# Patient Record
Sex: Male | Born: 1940 | Race: White | Hispanic: No | Marital: Single | Smoking: Never smoker
Health system: Southern US, Community
[De-identification: ages and names within clinical notes are randomized; demographics above are authoritative.]

## PROBLEM LIST (undated history)

## (undated) DIAGNOSIS — G4730 Sleep apnea, unspecified: Secondary | ICD-10-CM

## (undated) DIAGNOSIS — H9190 Unspecified hearing loss, unspecified ear: Secondary | ICD-10-CM

## (undated) DIAGNOSIS — I10 Essential (primary) hypertension: Secondary | ICD-10-CM

## (undated) DIAGNOSIS — L0390 Cellulitis, unspecified: Secondary | ICD-10-CM

## (undated) DIAGNOSIS — M109 Gout, unspecified: Secondary | ICD-10-CM

## (undated) DIAGNOSIS — T7840XA Allergy, unspecified, initial encounter: Secondary | ICD-10-CM

## (undated) DIAGNOSIS — C20 Malignant neoplasm of rectum: Secondary | ICD-10-CM

## (undated) HISTORY — DX: Allergy, unspecified, initial encounter: T78.40XA

---

## 1999-10-20 ENCOUNTER — Encounter: Payer: Self-pay | Admitting: Emergency Medicine

## 1999-10-20 ENCOUNTER — Inpatient Hospital Stay (HOSPITAL_COMMUNITY): Admission: EM | Admit: 1999-10-20 | Discharge: 1999-10-22 | Payer: Self-pay | Admitting: Emergency Medicine

## 1999-10-29 ENCOUNTER — Encounter: Admission: RE | Admit: 1999-10-29 | Discharge: 1999-10-29 | Payer: Self-pay | Admitting: Hematology and Oncology

## 1999-11-18 ENCOUNTER — Encounter: Admission: RE | Admit: 1999-11-18 | Discharge: 1999-11-18 | Payer: Self-pay | Admitting: Internal Medicine

## 2013-09-08 ENCOUNTER — Emergency Department (HOSPITAL_COMMUNITY)
Admission: EM | Admit: 2013-09-08 | Discharge: 2013-09-08 | Disposition: A | Discharge status: Home / Self Care | Payer: Medicare Other | Attending: Emergency Medicine | Admitting: Emergency Medicine

## 2013-09-08 ENCOUNTER — Encounter (HOSPITAL_COMMUNITY): Payer: Self-pay | Admitting: Emergency Medicine

## 2013-09-08 DIAGNOSIS — R509 Fever, unspecified: Secondary | ICD-10-CM | POA: Insufficient documentation

## 2013-09-08 DIAGNOSIS — L03119 Cellulitis of unspecified part of limb: Principal | ICD-10-CM

## 2013-09-08 DIAGNOSIS — L0390 Cellulitis, unspecified: Secondary | ICD-10-CM

## 2013-09-08 DIAGNOSIS — L02419 Cutaneous abscess of limb, unspecified: Secondary | ICD-10-CM | POA: Insufficient documentation

## 2013-09-08 HISTORY — DX: Essential (primary) hypertension: I10

## 2013-09-08 HISTORY — DX: Cellulitis, unspecified: L03.90

## 2013-09-08 LAB — BASIC METABOLIC PANEL
BUN: 19 mg/dL (ref 6–23)
CO2: 26 mEq/L (ref 19–32)
Calcium: 9.1 mg/dL (ref 8.4–10.5)
Chloride: 100 mEq/L (ref 96–112)
Creatinine, Ser: 1.06 mg/dL (ref 0.50–1.35)
GFR calc Af Amer: 80 mL/min — ABNORMAL LOW (ref >90)
GFR calc non Af Amer: 69 mL/min — ABNORMAL LOW (ref >90)
Glucose, Bld: 91 mg/dL (ref 70–99)
Potassium: 3.2 mEq/L — ABNORMAL LOW (ref 3.7–5.3)
Sodium: 141 mEq/L (ref 137–147)

## 2013-09-08 LAB — CBC WITH DIFFERENTIAL/PLATELET
Basophils Absolute: 0 10*3/uL (ref 0.0–0.1)
Basophils Relative: 0 % (ref 0–1)
Eosinophils Absolute: 0.1 10*3/uL (ref 0.0–0.7)
Eosinophils Relative: 1 % (ref 0–5)
HCT: 41.9 % (ref 39.0–52.0)
Hemoglobin: 15.3 g/dL (ref 13.0–17.0)
Lymphocytes Relative: 10 % — ABNORMAL LOW (ref 12–46)
Lymphs Abs: 1.1 10*3/uL (ref 0.7–4.0)
MCH: 33.8 pg (ref 26.0–34.0)
MCHC: 36.5 g/dL — ABNORMAL HIGH (ref 30.0–36.0)
MCV: 92.7 fL (ref 78.0–100.0)
Monocytes Absolute: 1.1 10*3/uL — ABNORMAL HIGH (ref 0.1–1.0)
Monocytes Relative: 10 % (ref 3–12)
Neutro Abs: 8.8 10*3/uL — ABNORMAL HIGH (ref 1.7–7.7)
Neutrophils Relative %: 79 % — ABNORMAL HIGH (ref 43–77)
Platelets: 96 10*3/uL — ABNORMAL LOW (ref 150–400)
RBC: 4.52 MIL/uL (ref 4.22–5.81)
RDW: 13.4 % (ref 11.5–15.5)
WBC: 11.2 10*3/uL — ABNORMAL HIGH (ref 4.0–10.5)

## 2013-09-08 MED ORDER — SODIUM CHLORIDE 0.9 % IV BOLUS (SEPSIS)
1000.0000 mL | Freq: Once | INTRAVENOUS | Status: AC
  Administered 2013-09-08: 1000 mL via INTRAVENOUS

## 2013-09-08 MED ORDER — HYDROCODONE-ACETAMINOPHEN 7.5-325 MG/15ML PO SOLN: 15.0000 mL | Freq: Three times a day (TID) | ORAL | Status: DC | PRN

## 2013-09-08 MED ORDER — CLINDAMYCIN HCL 150 MG PO CAPS: 300.0000 mg | ORAL_CAPSULE | Freq: Three times a day (TID) | ORAL | Status: DC

## 2013-09-08 MED ORDER — CLINDAMYCIN HCL 300 MG PO CAPS
300.0000 mg | ORAL_CAPSULE | Freq: Once | ORAL | Status: AC
  Administered 2013-09-08: 300 mg via ORAL
  Filled 2013-09-08 (×3): qty 1

## 2013-09-08 NOTE — ED Notes (Signed)
Pt refusing to sign discharge.

## 2013-09-08 NOTE — ED Provider Notes (Signed)
Medical screening examination/treatment/procedure(s) were conducted as a shared visit with non-physician practitioner(s) and myself.  I personally evaluated the patient during the encounter.  EKG Interpretation   None        Patient here with cellulitis of his L leg. No systemic symptoms here. NVI distally. Mildly elevated white count, vitals stable. Patient wants to go home. Tolerating PO antibiotics, stable for discharge.  Osvaldo Shipper, MD 09/08/13 [phone]

## 2013-09-08 NOTE — ED Notes (Signed)
Pt presents with 3 day h/o L leg redness and swelling.  Pt reports h/o cellulitis; denies any long-distance travel or shortness of breath. Th.

## 2013-09-08 NOTE — ED Notes (Signed)
Pt reports he is here for recurrent cellulitis. C/o pain to left leg X 3 days. Reports chills at home, thinks he had a fever. sts last time he was admitted to the hospital. Pt has redness and swelling to left leg. Nad, skin warm and dry, resp e/u.

## 2013-09-08 NOTE — ED Provider Notes (Signed)
CSN: [id number]     Arrival date & time 09/08/13  1200 History   First MD Initiated Contact with Patient 09/08/13 1717     Chief Complaint  Patient presents with  . Cellulitis   (Consider location/radiation/quality/duration/timing/severity/associated sxs/prior Treatment) HPI Comments: Patient is a 73 year old male with a past medical history of hypertension and cellulitis who presents with a 3 day history of left leg pain. Symptoms started gradually and progressively worsened since the onset. The pain is aching and severe without radiation. 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 Patient denies any injury to the area. He denies history of DVT. Patient denies any chest pain, SOB, recent travel. Patient reports his wound has improved with antibiotics in the past.    Past Medical History  Diagnosis Date  . Cellulitis     left lower leg  . Hypertension     "borderline"   History reviewed. No pertinent past surgical history. No family history on file. History  Substance Use Topics  . Smoking status: Never Smoker   . Smokeless tobacco: Not on file  . Alcohol Use: No    Review of Systems  Constitutional: Positive for fever. Negative for chills and fatigue.  HENT: Negative for trouble swallowing.   Eyes: Negative for visual disturbance.  Respiratory: Negative for shortness of breath.   Cardiovascular: Negative for chest pain and palpitations.  Gastrointestinal: Negative for nausea, vomiting, abdominal pain and diarrhea.  Genitourinary: Negative for dysuria and difficulty urinating.  Musculoskeletal: Negative for arthralgias and neck pain.  Skin: Positive for color change and wound.  Neurological: Negative for dizziness and weakness.  Psychiatric/Behavioral: Negative for dysphoric mood.    Allergies  Sulfa antibiotics  Home Medications   Current Outpatient Rx  Name  Route  Sig  Dispense  Refill  . aspirin 325 MG  tablet   Oral   Take 650 mg by mouth every 4 (four) hours as needed for mild pain.          BP 128/74  Pulse 60  Temp(Src) 98.7 F (37.1 C) (Oral)  Resp 20  Ht 5\' 11"  (1.803 m)  Wt 275 lb (124.739 kg)  BMI 38.37 kg/m2  SpO2 95% Physical Exam  Nursing note and vitals reviewed. Constitutional: He is oriented to person, place, and time. He appears well-developed and well-nourished. No distress.  HENT:  Head: Normocephalic and atraumatic.  Eyes: Conjunctivae and EOM are normal.  Neck: Normal range of motion.  Cardiovascular: Normal rate and regular rhythm.  Exam reveals no gallop and no friction rub.   No murmur heard. Pulmonary/Chest: Effort normal and breath sounds normal. He has no wheezes. He has no rales. He exhibits no tenderness.  Abdominal: Soft. He exhibits no distension. There is no tenderness. There is no rebound.  Musculoskeletal: Normal range of motion.  No calf tenderness to palpation bilaterally.   Neurological: He is alert and oriented to person, place, and time. Coordination normal.  Speech is goal-oriented. Moves limbs without ataxia.   Skin: Skin is warm and dry.  Left lower leg erythematous, edematous and tender to palpation. No open wound.   Psychiatric: He has a normal mood and affect. His behavior is normal.    ED Course  Procedures (including critical care time) Labs Review Labs Reviewed  CBC WITH DIFFERENTIAL - Abnormal; Notable for the following:    WBC 11.2 (*)    MCHC 36.5 (*)  Neutrophils Relative % 79 (*)    Neutro Abs 8.8 (*)    Lymphocytes Relative 10 (*)    Monocytes Absolute 1.1 (*)    All other components within normal limits  BASIC METABOLIC PANEL   Imaging Review No results found.  EKG Interpretation   None       MDM   1. Cellulitis     6:17 PM Labs pending. Vitals stable and patient afebrile. No calf tenderness to palpation. Patient likely has cellulitis. Patient will be treated for cellulitis. I doubt DVT at this  time.   6:58 PM Patient has mildly elevated WBC. Patient received PO clindamycin and will be discharged with Clindamycin and vicodin for pain. Patient does not want to stay here for admission. Patient will be discharged with instructions to return with worsening or concerning symptoms. Patient will have resource guide with instructions to find a PCP. Vitals stable and patient afebrile.  Dr. Mingo Amber saw the patient and is agreeable to plan.   Alvina Chou, PA-C 09/08/13 1902

## 2013-09-08 NOTE — Discharge Instructions (Signed)
Take Clindamycin as directed until gone for your infection. Take Vicodin as needed for pain. Refer to attached documents for more information. Follow up with a primary care doctor from the resource guide. Return to the ED with worsening or concerning symptoms.    Emergency Department Resource Guide 1) Find a Doctor and Pay Out of Pocket Although you won't have to find out who is covered by your insurance plan, it is a good idea to ask around and get recommendations. You will then need to call the office and see if the doctor you have chosen will accept you as a new patient and what types of options they offer for patients who are self-pay. Some doctors offer discounts or will set up payment plans for their patients who do not have insurance, but you will need to ask so you aren't surprised when you get to your appointment.  2) Contact Your Local Health Department Not all health departments have doctors that can see patients for sick visits, but many do, so it is worth a call to see if yours does. If you don't know where your local health department is, you can check in your phone book. The CDC also has a tool to help you locate your state's health department, and many state websites also have listings of all of their local health departments.  3) Find a [facility] If your illness is not likely to be very severe or complicated, you may want to try a walk in clinic. These are popping up all over the country in pharmacies, drugstores, and shopping centers. They're usually staffed by nurse practitioners or physician assistants that have been trained to treat common illnesses and complaints. They're usually fairly quick and inexpensive. However, if you have serious medical issues or chronic medical problems, these are probably not your best option.  No Primary Care Doctor: - Call Health Connect at  [phone] - they can help you locate a primary care doctor that  accepts your insurance, provides certain  services, etc. - Physician Referral Service- [phone]  Chronic Pain Problems: Organization         Address  Phone   Notes  [facility]  [phone] Patients need to be referred by their primary care doctor.   Medication Assistance: Organization         Address  Phone   Notes  [facility] Medication [facility] [address] [place], [place] [address] [phone] --Must be a resident of [facility] -- Must have NO insurance coverage whatsoever (no Medicaid/ Medicare, etc.) -- The pt. MUST have a primary care doctor that directs their care regularly and follows them in the community   MedAssist  [phone]   [employer]  [phone]    Agencies that provide inexpensive medical care: Organization         Address  Phone   Notes  [place]  [phone]   [facility]    [phone]   [facility] [place], [place] [address] [phone]   [place] [address] [place] [phone]   Planned Parenthood    [phone]   [facility]    [phone]   [place] and [place] [address], [place] Phone:  [phone], Fax:  [phone] Hours of Operation:  9 am - 6 pm, M-F.  Also  accepts Medicaid/Medicare and self-pay.  [facility] for [place], [address], [place] Phone: [phone], Fax: [phone]. Hours of Operation:  8:30 am - 5:30 pm, M-F.  Also accepts Medicaid and self-pay.  [facility] [address], [place] Phone: [phone]   [place], [place], [place] [phone], Ext. [phone] Mondays & Thursdays: 7-9 AM.  First 15 patients are seen on a first come, first serve basis.    [place] Providers:  Organization         Address  Phone   Notes  [facility] [address], [place] [phone] Also accepts self-pay patients.  [facility] [id number] [place], [place]  [phone]   [place], [address], [place] [phone]   [facility] [address] [place] [phone]   Lucianne Lei [address], [place]   [phone] Only accepts [place] Access [place] patients after they have their name applied to their card.   Self-Pay (no insurance) in [facility]:  Organization         Address  Phone   Notes  Sickle Cell Patients, [facility] [place] [phone]   [facility] [place] [phone]   [facility] [place]  [place], [place], [place] [phone]   [facility]/Dr. Osei-Bonsu  [address], [place] or [address], [place] [phone] Phone number for both [place] and [place] locations is the same.  [facility] [address] [place] [phone]   [facility] [address], [place] or [address] [phone]   [facility] [address], [place] [phone], phone; [phone], fax Sees patients 1st and 3rd Saturday of every month.  Must not qualify for public or private insurance (i.e. Medicaid, Medicare, [place] Health Choice, Veterans' Benefits)  Household income should be no more than 200% of the poverty level The clinic cannot treat you if you are pregnant or think you are pregnant  Sexually transmitted diseases are not treated at the clinic.    Dental Care: Organization         Address  Phone  Notes  [facility] Department [facility] [place] [phone] Accepts  children up to age 58 who are enrolled in [place] or [place]; pregnant women with a Medicaid card; and children who have applied for Medicaid or [place] Health Choice, but were declined, whose parents can pay a reduced fee at time of service.  [facility] of [facility]  [address], [place] [phone] Accepts children up to age 73 who are enrolled in [place] or [place]; pregnant women with a Medicaid card; and children who have applied for Medicaid or [place] Health Choice, but were declined, whose parents can pay a reduced fee at time of service.  [place] Adult Dental Access PROGRAM  [place] [phone] Patients are seen by appointment only. Walk-ins are not accepted. [place] will see patients 13 years of age and older. Monday - Tuesday (8am-5pm)  Most Wednesdays (8:30-5pm) $30 per visit, cash only  [facility] Adult [employer] PROGRAM  [address], [facility] [phone] Patients are seen by appointment only. Walk-ins are not accepted. [place] will see patients 76 years of age and older. One Wednesday Evening (Monthly: Volunteer Based).  $30 per visit, cash only  [place]  [phone] for adults; Children under age 76, call Graduate Pediatric Dentistry at [phone]. Children aged 3-14, please call [phone] to request a pediatric application.  Dental services are provided in all areas of dental care including fillings, crowns and bridges, complete and partial dentures, implants, gum treatment, root canals, and extractions. Preventive care is also provided. Treatment is provided to both adults and children. Patients are selected via a lottery and there is often a waiting list.   [facility] [address] [place]  [phone] [email or web address]   [facility] [address] [place], [place] [phone], Ext. [phone] Second and  Fourth Thursday of each month, opens at 6:30 AM; Clinic ends at 9 AM.  Patients are seen on a first-come first-served basis, and a limited number are seen during each clinic.   [facility]  [address] Hillard Danker [place], [place] [phone]   Eligibility Requirements You must have lived in [place], [place], or [place] counties for at least the last three months.   You cannot be eligible for state or federal sponsored [employer], including [employer], [place], or [employer].   You generally cannot be eligible for healthcare insurance through your employer.    How to apply: Eligibility screenings are held every Tuesday and Wednesday afternoon from 1:00 pm until 4:00 pm. You do not need an appointment for the interview!  [facility] [address], [place], [place]   [place]  [place] Department  Industry  [phone]    Behavioral Health Resources in the Community: Intensive Outpatient Programs Organization         Address  Phone  Notes  [place] Ruth. [address], [place], [place] [phone]   [facility] Outpatient [address], [place], [place]   ADS: Alcohol & Drug Svcs [address], [place], [place]   [place] [address]  [place], [place] or [phone]   Substance Abuse Resources Organization         Address  Phone  Notes  Alcohol and Drug Services  [phone]   [place]  [phone]   [facility] [place]   Chinita Pester  [phone]   Residential & Outpatient Substance Abuse Program  [phone]   Psychological Services Organization         Address  Phone  Notes  [facility] [place]  [place]  [phone]   [place]  [address], [place] or [phone]    Mobile Crisis Teams Organization         Address  Phone  Notes  [facility], Mobile Crisis Care Unit  [phone]   [facility] Psychotherapeutic Services  [address] [place], [name]   [place] Levels [address] [place] [phone]    Self-Help/Support Groups Organization         Address  Phone  Notes  Mental Health Assoc. of [place] - variety of support groups  [place] Call for more information  Narcotics Anonymous (NA), [facility] [address], [employer] [place]  2 meetings at this location   Special educational needs teacher         Address  Phone  Notes  ASAP Residential Treatment [place],    [place]  [phone]   [facility]  [address] [place] 762831, [place], [place]   [place], [place] [phone] Admissions: 8am-3pm M-F  Incentives Substance [place] [address]    [place], [place] [phone]   [facility] [address] [place], [place], [place]   [facility] [address],  [place], [place]   [facility] Programs - Intensive Outpatient [address] Kristeen Mans [address], [place], [place]   [facility] [place].,  [place], [place] [phone] or [phone]   [facility] Services (RTS) [address] [place], [place] Accepts Medicaid  [facility] [employer] [address],  [place] [phone] Substance Abuse/Addiction Treatment   [facility] Organization         Address  Phone  Notes  [facility]  [phone]   Domenic Schwab, PhD [address] Arlis Porta [place], [place]   [phone] or [phone]   [place], [place] [phone]   [facility] [address] [place], [place] [phone] Insurance/Medicaid/sponsorship through [facility] and Families [address], [address] [place]                                    [place], [place] [phone] [place] [address], [place] [phone]    Dr. Adele Schilder  [phone]   [facility] Dept. 1) [address] [place] 2) [place] 3)  [place] 65, [place] [phone]  [phone]   [place] [phone] or [phone] (After Hours)

## 2014-02-09 ENCOUNTER — Inpatient Hospital Stay (HOSPITAL_COMMUNITY)
Admission: EM | Admit: 2014-02-09 | Discharge: 2014-02-12 | DRG: 871 | Disposition: A | Discharge status: Home / Self Care | Payer: Medicare Other | Attending: Internal Medicine | Admitting: Internal Medicine

## 2014-02-09 ENCOUNTER — Emergency Department (HOSPITAL_COMMUNITY): Payer: Medicare Other

## 2014-02-09 ENCOUNTER — Encounter (HOSPITAL_COMMUNITY): Payer: Self-pay | Admitting: Emergency Medicine

## 2014-02-09 DIAGNOSIS — L03116 Cellulitis of left lower limb: Secondary | ICD-10-CM

## 2014-02-09 DIAGNOSIS — L03119 Cellulitis of unspecified part of limb: Secondary | ICD-10-CM

## 2014-02-09 DIAGNOSIS — D696 Thrombocytopenia, unspecified: Secondary | ICD-10-CM | POA: Diagnosis present

## 2014-02-09 DIAGNOSIS — I872 Venous insufficiency (chronic) (peripheral): Secondary | ICD-10-CM | POA: Diagnosis present

## 2014-02-09 DIAGNOSIS — E876 Hypokalemia: Secondary | ICD-10-CM | POA: Diagnosis present

## 2014-02-09 DIAGNOSIS — L02419 Cutaneous abscess of limb, unspecified: Secondary | ICD-10-CM | POA: Diagnosis present

## 2014-02-09 DIAGNOSIS — I509 Heart failure, unspecified: Secondary | ICD-10-CM | POA: Diagnosis present

## 2014-02-09 DIAGNOSIS — G9341 Metabolic encephalopathy: Secondary | ICD-10-CM

## 2014-02-09 DIAGNOSIS — Z7982 Long term (current) use of aspirin: Secondary | ICD-10-CM

## 2014-02-09 DIAGNOSIS — I10 Essential (primary) hypertension: Secondary | ICD-10-CM | POA: Diagnosis present

## 2014-02-09 DIAGNOSIS — R509 Fever, unspecified: Secondary | ICD-10-CM | POA: Diagnosis present

## 2014-02-09 DIAGNOSIS — R5083 Postvaccination fever: Secondary | ICD-10-CM

## 2014-02-09 DIAGNOSIS — R410 Disorientation, unspecified: Secondary | ICD-10-CM

## 2014-02-09 DIAGNOSIS — A419 Sepsis, unspecified organism: Principal | ICD-10-CM | POA: Diagnosis present

## 2014-02-09 LAB — COMPREHENSIVE METABOLIC PANEL
ALK PHOS: 100 U/L (ref 39–117)
ALT: 25 U/L (ref 0–53)
AST: 32 U/L (ref 0–37)
Albumin: 4 g/dL (ref 3.5–5.2)
BUN: 13 mg/dL (ref 6–23)
CO2: 24 mEq/L (ref 19–32)
CREATININE: 1.13 mg/dL (ref 0.50–1.35)
Calcium: 9.2 mg/dL (ref 8.4–10.5)
Chloride: 102 mEq/L (ref 96–112)
GFR calc non Af Amer: 63 mL/min — ABNORMAL LOW (ref >90)
GFR, EST AFRICAN AMERICAN: 73 mL/min — AB (ref >90)
GLUCOSE: 114 mg/dL — AB (ref 70–99)
Potassium: 3.7 mEq/L (ref 3.7–5.3)
Sodium: 139 mEq/L (ref 137–147)
TOTAL PROTEIN: 8 g/dL (ref 6.0–8.3)
Total Bilirubin: 1 mg/dL (ref 0.3–1.2)

## 2014-02-09 LAB — URINALYSIS, ROUTINE W REFLEX MICROSCOPIC
Bilirubin Urine: NEGATIVE
Glucose, UA: NEGATIVE mg/dL
Ketones, ur: NEGATIVE mg/dL
Leukocytes, UA: NEGATIVE
Nitrite: NEGATIVE
PROTEIN: NEGATIVE mg/dL
Specific Gravity, Urine: 1.018 (ref 1.005–1.030)
UROBILINOGEN UA: 0.2 mg/dL (ref 0.0–1.0)
pH: 5 (ref 5.0–8.0)

## 2014-02-09 LAB — CK: Total CK: 103 U/L (ref 7–232)

## 2014-02-09 LAB — I-STAT CHEM 8, ED
BUN: 14 mg/dL (ref 6–23)
Calcium, Ion: 1.14 mmol/L (ref 1.13–1.30)
Chloride: 102 mEq/L (ref 96–112)
Creatinine, Ser: 1.2 mg/dL (ref 0.50–1.35)
GLUCOSE: 114 mg/dL — AB (ref 70–99)
HCT: 47 % (ref 39.0–52.0)
HEMOGLOBIN: 16 g/dL (ref 13.0–17.0)
Potassium: 3.5 mEq/L — ABNORMAL LOW (ref 3.7–5.3)
SODIUM: 142 meq/L (ref 137–147)
TCO2: 23 mmol/L (ref 0–100)

## 2014-02-09 LAB — RAPID URINE DRUG SCREEN, HOSP PERFORMED
AMPHETAMINES: NOT DETECTED
BARBITURATES: NOT DETECTED
BENZODIAZEPINES: NOT DETECTED
COCAINE: NOT DETECTED
OPIATES: NOT DETECTED
TETRAHYDROCANNABINOL: NOT DETECTED

## 2014-02-09 LAB — I-STAT CG4 LACTIC ACID, ED: Lactic Acid, Venous: 1.43 mmol/L (ref 0.5–2.2)

## 2014-02-09 LAB — CBC
HCT: 42.4 % (ref 39.0–52.0)
HEMOGLOBIN: 14.7 g/dL (ref 13.0–17.0)
MCH: 32.7 pg (ref 26.0–34.0)
MCHC: 34.7 g/dL (ref 30.0–36.0)
MCV: 94.2 fL (ref 78.0–100.0)
Platelets: 97 10*3/uL — ABNORMAL LOW (ref 150–400)
RBC: 4.5 MIL/uL (ref 4.22–5.81)
RDW: 13.5 % (ref 11.5–15.5)
WBC: 12.1 10*3/uL — ABNORMAL HIGH (ref 4.0–10.5)

## 2014-02-09 LAB — I-STAT TROPONIN, ED: TROPONIN I, POC: 0.01 ng/mL (ref 0.00–0.08)

## 2014-02-09 LAB — CBG MONITORING, ED: Glucose-Capillary: 110 mg/dL — ABNORMAL HIGH (ref 70–99)

## 2014-02-09 LAB — PROTIME-INR
INR: 1.07 (ref 0.00–1.49)
PROTHROMBIN TIME: 13.7 s (ref 11.6–15.2)

## 2014-02-09 LAB — URINE MICROSCOPIC-ADD ON

## 2014-02-09 LAB — ETHANOL: Alcohol, Ethyl (B): <11 mg/dL (ref 0–11)

## 2014-02-09 MED ORDER — SODIUM CHLORIDE 0.9 % IJ SOLN
3.0000 mL | Freq: Two times a day (BID) | INTRAMUSCULAR | Status: DC
  Administered 2014-02-10: 3 mL via INTRAVENOUS

## 2014-02-09 MED ORDER — ONDANSETRON HCL 4 MG PO TABS: 4.0000 mg | ORAL_TABLET | Freq: Four times a day (QID) | ORAL | Status: DC | PRN

## 2014-02-09 MED ORDER — PIPERACILLIN-TAZOBACTAM 3.375 G IVPB 30 MIN
3.3750 g | INTRAVENOUS | Status: AC
  Administered 2014-02-10: 3.375 g via INTRAVENOUS
  Filled 2014-02-09: qty 50

## 2014-02-09 MED ORDER — SODIUM CHLORIDE 0.9 % IV SOLN
INTRAVENOUS | Status: AC
Start: 2014-02-09 — End: 2014-02-10
  Administered 2014-02-09 – 2014-02-10 (×2): via INTRAVENOUS

## 2014-02-09 MED ORDER — HYDROMORPHONE HCL PF 1 MG/ML IJ SOLN: 0.5000 mg | INTRAMUSCULAR | Status: DC | PRN

## 2014-02-09 MED ORDER — ASPIRIN 325 MG PO TABS
650.0000 mg | ORAL_TABLET | ORAL | Status: DC | PRN
  Filled 2014-02-09: qty 2

## 2014-02-09 MED ORDER — VANCOMYCIN HCL 10 G IV SOLR
1250.0000 mg | Freq: Two times a day (BID) | INTRAVENOUS | Status: DC
  Administered 2014-02-10: 1250 mg via INTRAVENOUS
  Filled 2014-02-09 (×4): qty 1250

## 2014-02-09 MED ORDER — VANCOMYCIN HCL 10 G IV SOLR
2500.0000 mg | Freq: Once | INTRAVENOUS | Status: AC
  Administered 2014-02-10: 2500 mg via INTRAVENOUS
  Filled 2014-02-09: qty 2500

## 2014-02-09 MED ORDER — ONDANSETRON HCL 4 MG/2ML IJ SOLN: 4.0000 mg | Freq: Four times a day (QID) | INTRAMUSCULAR | Status: DC | PRN

## 2014-02-09 MED ORDER — ENOXAPARIN SODIUM 40 MG/0.4ML ~~LOC~~ SOLN
40.0000 mg | SUBCUTANEOUS | Status: DC
  Administered 2014-02-10 – 2014-02-11 (×2): 40 mg via SUBCUTANEOUS
  Filled 2014-02-09 (×4): qty 0.4

## 2014-02-09 MED ORDER — POTASSIUM CHLORIDE CRYS ER 20 MEQ PO TBCR
40.0000 meq | EXTENDED_RELEASE_TABLET | Freq: Once | ORAL | Status: AC
  Administered 2014-02-10: 40 meq via ORAL
  Filled 2014-02-09: qty 2

## 2014-02-09 MED ORDER — OXYCODONE HCL 5 MG PO TABS: 5.0000 mg | ORAL_TABLET | ORAL | Status: DC | PRN

## 2014-02-09 MED ORDER — PIPERACILLIN-TAZOBACTAM 3.375 G IVPB
3.3750 g | Freq: Three times a day (TID) | INTRAVENOUS | Status: DC
  Administered 2014-02-10: 3.375 g via INTRAVENOUS
  Filled 2014-02-09 (×5): qty 50

## 2014-02-09 NOTE — ED Provider Notes (Signed)
CSN: [id number]     Arrival date & time 02/09/14  1709 History   First MD Initiated Contact with Patient 02/09/14 1714     Chief Complaint  Patient presents with  . Altered Mental Status   Level V caveat due to altered mental status  (Consider location/radiation/quality/duration/timing/severity/associated sxs/prior Treatment) Patient is a 73 y.o. male presenting with altered mental status. The history is provided by the patient and the EMS personnel.  Altered Mental Status  patient presents with altered mental status. He reported in found sitting in the car with his mother at a convenient store that was not open. Unknown how long they have been sitting in the car. Reportedly patient and mother were both feeling warm. Patient cannot tell you what happened. He denies cough. There is some confusion. He denies dysuria. He states that he needs to warm up.  Past Medical History  Diagnosis Date  . Cellulitis     left lower leg  . Hypertension     "borderline"   History reviewed. No pertinent past surgical history. No family history on file. History  Substance Use Topics  . Smoking status: Never Smoker   . Smokeless tobacco: Not on file  . Alcohol Use: No    Review of Systems  Unable to perform ROS     Allergies  Sulfa antibiotics  Home Medications   Prior to Admission medications   Medication Sig Start Date End Date Taking? Authorizing Kayleah Appleyard  aspirin 325 MG tablet Take 650 mg by mouth every 4 (four) hours as needed for mild pain.    Historical Loreen Bankson, MD   BP 123/55  Pulse 76  Temp(Src) 100.5 F (38.1 C) (Oral)  Resp 20  Ht 5\' 11"  (1.803 m)  Wt 291 lb 14.2 oz (132.4 kg)  BMI 40.73 kg/m2  SpO2 94% Physical Exam  Constitutional: He appears well-developed and well-nourished.  HENT:  Head: Normocephalic and atraumatic.  Eyes: EOM are normal. Pupils are equal, round, and reactive to light.  Neck: Neck supple.  Cardiovascular: Normal rate and regular rhythm.    Pulmonary/Chest: Effort normal and breath sounds normal.  Abdominal: Soft. Bowel sounds are normal. There is no tenderness.  Musculoskeletal: He exhibits edema.  Mild bilateral lower extremity pitting edema. Chronic changes in left lower leg with extra warmth at the site. Possible cellulitis  Neurological:  Some confusion. Follows commands but is not do well answer questions  Skin: Skin is warm and dry. No erythema.    ED Course  Procedures (including critical care time) Labs Review Labs Reviewed  CBC - Abnormal; Notable for the following:    WBC 12.1 (*)    Platelets 97 (*)    All other components within normal limits  COMPREHENSIVE METABOLIC PANEL - Abnormal; Notable for the following:    Glucose, Bld 114 (*)    GFR calc non Af Amer 63 (*)    GFR calc Af Amer 73 (*)    All other components within normal limits  URINALYSIS, ROUTINE W REFLEX MICROSCOPIC - Abnormal; Notable for the following:    APPearance CLOUDY (*)    Hgb urine dipstick SMALL (*)    All other components within normal limits  CBG MONITORING, ED - Abnormal; Notable for the following:    Glucose-Capillary 110 (*)    All other components within normal limits  I-STAT CHEM 8, ED - Abnormal; Notable for the following:    Potassium 3.5 (*)    Glucose, Bld 114 (*)    All  other components within normal limits  CULTURE, BLOOD (ROUTINE X 2)  CULTURE, BLOOD (ROUTINE X 2)  URINE CULTURE  URINE CULTURE  CULTURE, BLOOD (ROUTINE X 2)  CULTURE, BLOOD (ROUTINE X 2)  PROTIME-INR  CK  URINE MICROSCOPIC-ADD ON  URINE RAPID DRUG SCREEN (HOSP PERFORMED)  ETHANOL  TSH  HEMOGLOBIN A1C  CBC  BASIC METABOLIC PANEL  LACTIC ACID, PLASMA  PROCALCITONIN  I-STAT CG4 LACTIC ACID, ED  Randolm Idol, ED    Imaging Review Dg Chest Port 1 View  02/09/2014   CLINICAL DATA:  Fever.  Altered mental status.  EXAM: PORTABLE CHEST - 1 VIEW  COMPARISON:  None.  FINDINGS: The chest x-ray is limited by the patient's body habitus and  position.  The heart is enlarged in the mediastinal and hilar contours are prominent. There is vascular congestion and probable mild interstitial edema. Possible pleural effusions with areas of atelectasis.  IMPRESSION: Limited examination but suspect CHF with small effusions and areas of atelectasis.   Electronically Signed   By: Kalman Jewels M.D.   On: 02/09/2014 20:42     EKG Interpretation None      MDM   Final diagnoses:  Post-vaccination fever  Hypokalemia   Diagnosis encephalopathy, fever. Patient with altered mental status and fever. Unknown clear cause, although could be related to cellulitis. Patient status improved somewhat. He'll be admitted to internal medicine     Jasper Riling. Alvino Chapel, MD 02/10/14 [phone]

## 2014-02-09 NOTE — ED Notes (Signed)
Patient transported to X-ray 

## 2014-02-09 NOTE — H&P (Addendum)
Patient ID: Eric Schultz, male   DOB: 26-Oct-1940, 73 y.o.   MRN: [id number]  [facility] History and Physical  SHANKAR SILBER ZJI:[id number] DOB: [birth year] DOA: 02/09/2014  Referring physician: ER physician PCP: No primary provider on file.   Chief Complaint: confusion and fever  HPI:  Pt is 73 yo male with no known history, who was brought in to [facility] ED via EMS after noted to be confused. Please note that pt is unable to provide any history on arrival to the ED or upon admission. He was apparently found in the car with his mother at a convenience store that was not even open. Mother at bedside said it was very hot in the car but she is also unable to provide history. No reported chest pain or shortness of breath.   In ED, pt noted to be confused and unable to provide history. Noted to have fever 103.8 F, tachycardic with HR 120 - 130's, WBC 12.1. No ABX given. TRH asked to admit for further evaluation and management for sepsis of unknown source.   Assessment & Plan    Active Problems: Sepsis - pt meets criteria for sepsis given fever 103 F, WBC 12.1, tachycardia 127 - source unclear but could be LE cellulitis from severe chronic venous stasis  - also ? Heat stroke  - admit to telemetry bed - start with gentle hydration with NS, ABX vancomycin and zosyn - obtain blood and urine culture, UA, UDS, alcohol level, TSH - also check lactic acid and procalcitonin level  - narrow ABX as clinically indicated  Hypokalemia - give one dose of K-dur if pt passes bedside swallow eval  Thrombocytopenia - no signs of active bleeding - this appears to be chronic and per record review, Plt in January 2015 in 90's - will repeat CBC in AM Acute encephalopathy  - unclear etiology but possible secondary to sepsis again of unclear source - ? Underlying dementia, pt looks very disheveled and unable to provide any history - supportive care as noted above and once more medically stable will plan on  ordering PT/OT - place order for SLP now  - check UDS and alcohol level  ? CHF - obtain 2 D ECHO, no history of ECHO in the EPIC system - gentle hydration due to sepsis to avoid volume overload - weight on admission 275 lbs, monitor daily weights, strict I's and O's   DVT prophylaxis: lovenox   Radiological Exams on Admission: Dg Chest Port 1 View  02/09/2014  Limited examination but suspect CHF with small effusions and areas of atelectasis.     EKG: pending   Code Status: Full but pt unable to confirm due to AMS  Family Communication: no family at bedside and pt confused  Disposition Plan: Admit to telemetry bed   Leisa Lenz, MD  [facility] Pager [phone]  Review of Systems:  Unable to obtain due to AMS    Past Medical History  Diagnosis Date  . Cellulitis     left lower leg  . Hypertension     "borderline"   History reviewed. No pertinent past surgical history. Social History:  reports that he has never smoked. He does not have any smokeless tobacco history on file. He reports that he does not drink alcohol or use illicit drugs.  Allergies  Allergen Reactions  . Sulfa Antibiotics Nausea And Vomiting    Family History:Unable to obtain due to AMS   Prior to Admission medications   Medication Sig  Start Date End Date Taking? Authorizing Provider  aspirin 325 MG tablet Take 650 mg by mouth every 4 (four) hours as needed for mild pain.    Historical Provider, MD   Physical Exam: Filed Vitals:   02/09/14 2030 02/09/14 2047 02/09/14 2100 02/09/14 2130  BP: 153/82 123/73 133/71 116/44  Pulse: 84 80 89 79  Temp:      TempSrc:      Resp: 25 25 25    SpO2: 93% 93% 93% 91%    Physical Exam  Constitutional: Appears disheveled but calm, now able to answer any questions and not following commands  HENT: Normocephalic. No tonsillar erythema or exudates Eyes: PERRLA, no scleral icterus.  Neck: Normal passive  ROM. Neck supple. No JVD. No tracheal deviation. No  thyromegaly.  CVS: Regular rhythm, tachycardic, no gallops, no carotid bruit.  Pulmonary: Poor inspiratory effort, diminished breath sounds at bases, no rhonchi  Abdominal: Soft. BS +,  no distension, tenderness, rebound or guarding.  Musculoskeletal: No edema in upper extremities and no tenderness. Chronic venous stasis changes in bilateral LE with erythema and edema, warm to touch Lymphadenopathy: No lymphadenopathy noted, cervical, inguinal. Neuro: somnolent but easy to arouse. Unable to follow commands or answer questions appropriately  Skin: no rash  Psychiatric: unable to assess due to AMS  Labs on Admission:  Basic Metabolic Panel:  Recent Labs Lab 02/09/14 1755 02/09/14 1812  NA 139 142  K 3.7 3.5*  CL 102 102  CO2 24  --   GLUCOSE 114* 114*  BUN 13 14  CREATININE 1.13 1.20  CALCIUM 9.2  --    Liver Function Tests:  Recent Labs Lab 02/09/14 1755  AST 32  ALT 25  ALKPHOS 100  BILITOT 1.0  PROT 8.0  ALBUMIN 4.0   CBC:  Recent Labs Lab 02/09/14 1755 02/09/14 1812  WBC 12.1*  --   HGB 14.7 16.0  HCT 42.4 47.0  MCV 94.2  --   PLT 97*  --    Cardiac Enzymes:  Recent Labs Lab 02/09/14 1755  CKTOTAL 103   CBG:  Recent Labs Lab 02/09/14 1732  GLUCAP 110*    If 7PM-7AM, please contact night-coverage [email or web address] Password [facility] 02/09/2014, 10:17 PM

## 2014-02-09 NOTE — ED Notes (Signed)
Portable chest xray at bedside.

## 2014-02-09 NOTE — ED Notes (Signed)
Pt placed in bed. Pt monitored by 12-lead, bp cuff, and pulse ox. 

## 2014-02-09 NOTE — ED Notes (Signed)
Pt arrives [facility] from a "store" for AMS. Pt was driving in his car with his 72 year old mother home. Pt pulled into an empty building where some workers report patient was confused.

## 2014-02-09 NOTE — ED Notes (Signed)
CG-4 reported to Dr. Alvino Chapel

## 2014-02-09 NOTE — ED Notes (Signed)
PT CBG = 110  Nurse informed of CBG result.

## 2014-02-09 NOTE — Consult Note (Signed)
PHARMACY CONSULT NOTE - INITIAL  Pharmacy Consult for :   Vancomycin, Zosyn Indication:  Sepsis of unknown etiology, possible LL leg cellulitis  Hospital Problems: Principal Problem:   Sepsis Active Problems:   Fever   Hypokalemia   Thrombocytopenia   Allergies: Allergies  Allergen Reactions  . Sulfa Antibiotics Nausea And Vomiting    Patient Measurements: Height: 5' 10.87" (180 cm) Weight: 281 lb 8.4 oz (127.7 kg) IBW/kg (Calculated) : 74.99  Vital Signs: BP 132/83  Pulse 97  Temp(Src) 102.8 F (39.3 C) (Rectal)  Resp 25  Ht 5' 10.87" (1.8 m)  Wt 281 lb 8.4 oz (127.7 kg)  BMI 39.41 kg/m2  SpO2 94%  Labs:  Recent Labs  02/09/14 1755 02/09/14 1812  WBC 12.1*  --   HGB 14.7 16.0  PLT 97*  --   CREATININE 1.13 1.20   Estimated Creatinine Clearance: 75.6 ml/min (by C-G formula based on Cr of 1.2).   Microbiology: No results found for this or any previous visit (from the past 720 hour(s)).  Medical/Surgical History: Past Medical History  Diagnosis Date  . Cellulitis     left lower leg  . Hypertension     "borderline"   History reviewed. No pertinent past surgical history.  Current Medication[s] Include: Medication PTA: Aspirin  Scheduled:  Scheduled:   Infusion[s]: Infusions:   Antibiotic[s]: Anti-infectives   None      Assessment:  73 y/o male admitted with acute encephalopathy of unknown etiology, Sepsis, Possilbe LL leg cellulitis  Vancomycin and Zosyn to be started per Pharmacy consult  Goal of Therapy:   Vancomycin trough level 15-20 mcg/ml Zosyn selected for broad empiric antibiotic coverage and adjusted as required.   Plan:   Zosyn 3.375 gm IV x 1 over 30 minutes, then Begin Zosyn 3.375 gm IV q 8 hours, each dose to infuse over 4 hours Vancomycin 2500 mg IV load, then Vancomycin 1250 mg IV q 12 hours. Follow up SCr, UOP, cultures, Levels, clinical course and adjust as clinically indicated.  Stramoski, Craig Guess,   Pharm.D,    6/18/201511:05 PM

## 2014-02-09 NOTE — Progress Notes (Signed)
Attempt to call for report x1.  RN busy and will call back.

## 2014-02-09 NOTE — ED Notes (Signed)
Dr Pickering at bedside 

## 2014-02-10 DIAGNOSIS — L02419 Cutaneous abscess of limb, unspecified: Secondary | ICD-10-CM

## 2014-02-10 DIAGNOSIS — A419 Sepsis, unspecified organism: Principal | ICD-10-CM

## 2014-02-10 DIAGNOSIS — D696 Thrombocytopenia, unspecified: Secondary | ICD-10-CM

## 2014-02-10 DIAGNOSIS — L03119 Cellulitis of unspecified part of limb: Secondary | ICD-10-CM

## 2014-02-10 LAB — BASIC METABOLIC PANEL
BUN: 13 mg/dL (ref 6–23)
CALCIUM: 9 mg/dL (ref 8.4–10.5)
CO2: 23 mEq/L (ref 19–32)
CREATININE: 0.98 mg/dL (ref 0.50–1.35)
Chloride: 99 mEq/L (ref 96–112)
GFR, EST NON AFRICAN AMERICAN: 80 mL/min — AB (ref >90)
Glucose, Bld: 120 mg/dL — ABNORMAL HIGH (ref 70–99)
Potassium: 3.3 mEq/L — ABNORMAL LOW (ref 3.7–5.3)
Sodium: 137 mEq/L (ref 137–147)

## 2014-02-10 LAB — CBC
HEMATOCRIT: 38.6 % — AB (ref 39.0–52.0)
Hemoglobin: 13.5 g/dL (ref 13.0–17.0)
MCH: 32.8 pg (ref 26.0–34.0)
MCHC: 35 g/dL (ref 30.0–36.0)
MCV: 93.7 fL (ref 78.0–100.0)
Platelets: 87 10*3/uL — ABNORMAL LOW (ref 150–400)
RBC: 4.12 MIL/uL — ABNORMAL LOW (ref 4.22–5.81)
RDW: 13.6 % (ref 11.5–15.5)
WBC: 12.3 10*3/uL — ABNORMAL HIGH (ref 4.0–10.5)

## 2014-02-10 LAB — TSH: TSH: 0.926 u[IU]/mL (ref 0.350–4.500)

## 2014-02-10 LAB — PROCALCITONIN: Procalcitonin: 1.11 ng/mL

## 2014-02-10 LAB — LACTIC ACID, PLASMA: LACTIC ACID, VENOUS: 1.6 mmol/L (ref 0.5–2.2)

## 2014-02-10 LAB — HEMOGLOBIN A1C
Hgb A1c MFr Bld: 5.4 % (ref <5.7)
Mean Plasma Glucose: 108 mg/dL (ref <117)

## 2014-02-10 LAB — GLUCOSE, CAPILLARY: Glucose-Capillary: 120 mg/dL — ABNORMAL HIGH (ref 70–99)

## 2014-02-10 MED ORDER — HALOPERIDOL LACTATE 5 MG/ML IJ SOLN
2.0000 mg | Freq: Four times a day (QID) | INTRAMUSCULAR | Status: DC | PRN
  Filled 2014-02-10: qty 0.4

## 2014-02-10 MED ORDER — LORAZEPAM 2 MG/ML IJ SOLN
1.0000 mg | Freq: Once | INTRAMUSCULAR | Status: AC
  Administered 2014-02-10: 1 mg via INTRAVENOUS

## 2014-02-10 MED ORDER — CALCIUM CARBONATE ANTACID 500 MG PO CHEW
1.0000 | CHEWABLE_TABLET | Freq: Three times a day (TID) | ORAL | Status: DC | PRN
  Administered 2014-02-11 (×2): 200 mg via ORAL
  Filled 2014-02-10 (×3): qty 1

## 2014-02-10 MED ORDER — HALOPERIDOL LACTATE 5 MG/ML IJ SOLN
2.0000 mg | Freq: Four times a day (QID) | INTRAMUSCULAR | Status: DC | PRN
  Administered 2014-02-10: 2 mg via INTRAMUSCULAR
  Filled 2014-02-10: qty 0.4

## 2014-02-10 MED ORDER — POTASSIUM CHLORIDE CRYS ER 20 MEQ PO TBCR
40.0000 meq | EXTENDED_RELEASE_TABLET | Freq: Four times a day (QID) | ORAL | Status: AC
  Administered 2014-02-10 (×2): 40 meq via ORAL
  Filled 2014-02-10 (×2): qty 2

## 2014-02-10 MED ORDER — LORAZEPAM 2 MG/ML IJ SOLN
INTRAMUSCULAR | Status: AC
Start: 2014-02-10 — End: 2014-02-11
  Filled 2014-02-10: qty 1

## 2014-02-10 MED ORDER — ACETAMINOPHEN 325 MG PO TABS
650.0000 mg | ORAL_TABLET | ORAL | Status: DC | PRN
  Administered 2014-02-10 (×2): 650 mg via ORAL
  Filled 2014-02-10 (×2): qty 2

## 2014-02-10 NOTE — Progress Notes (Signed)
Follow up note  I was notified by RN that patient left his room unexpectedly wanted to leave hospital. He was found circling the parking lot. Security was called. As outline in my previous note he presented with sepsis associated with acute encephalopathy. On my evaluation done earlier this morning, it appeared he had improved becoming more lucid. I met patient in the parking lot. He appeared agitated, confrontational and angry. He could not give me a reason why he was choosing to leave the hospital so abruptly. Although he could respond correctly to questions concerning where he was and time he could not tell me the potential consequences of "being sick and not getting medical help" neither could he demonstrate understanding of his current medical condition. I do not feel that Eric Schultz has the capacity to make his own medical decisions and poses a risk to himself if he were to leave that hospital. Therefore he has been placed under involuntary commitment. IVC process has been initiated. Case discussed with Education officer, museum.   35 min were spent in additional face to face time.

## 2014-02-10 NOTE — Evaluation (Signed)
Clinical/Bedside Swallow Evaluation Patient Details  Name: Eric Schultz MRN: [id number] Date of Birth: [birth year]  Today's Date: 02/10/2014 Time: 1696-7893 SLP Time Calculation (min): 11 min  Past Medical History:  Past Medical History  Diagnosis Date  . Cellulitis     left lower leg  . Hypertension     "borderline"   Past Surgical History: History reviewed. No pertinent past surgical history. HPI:  73 yr old presents with altered mental status found sitting in the car confused, with his 59 yr old mother.  CXR 6/18 Limited examination but suspect CHF with small effusions and areas of atelectasis. CT/MRI not performed.  MD diagnosis encephalopathy and fever.    Assessment / Plan / Recommendation Clinical Impression  Pt. awake, somewhat drowsy during swallow assessment.  Increased volume, velocity and mild lethargy likely led to immediate cough following consecutive straw sips with thin.  Single, small cup sips of thin appeared safe.  Pt. educated on general swallow precautions.  Recommend continue regular diet texture and thin liquids via cup, pills whole with thin, small sips.  No further follow up needed.     Aspiration Risk  Mild    Diet Recommendation Regular;Thin liquid   Liquid Administration via: Straw;Cup Medication Administration: Whole meds with liquid Supervision: Patient able to self feed Compensations: Slow rate;Small sips/bites Postural Changes and/or Swallow Maneuvers: Seated upright 90 degrees    Other  Recommendations Oral Care Recommendations: Oral care BID   Follow Up Recommendations  None    Frequency and Duration        Pertinent Vitals/Pain WDL         Swallow Study         Oral/Motor/Sensory Function Overall Oral Motor/Sensory Function: Appears within functional limits for tasks assessed   Ice Chips Ice chips: Not tested   Thin Liquid Thin Liquid: Impaired Presentation: Straw;Cup Pharyngeal  Phase Impairments: Cough - Immediate  (following straw)    Nectar Thick Nectar Thick Liquid: Not tested   Honey Thick Honey Thick Liquid: Not tested   Puree Puree: Not tested   Solid   GO    Solid: Within functional limits       Orbie Pyo [employer].Ed CCC-SLP Pager [phone] 02/10/2014

## 2014-02-10 NOTE — Progress Notes (Signed)
Bedside swallow evaluation completed prior to PO medication administration.  Pt passed with no problems.  No coughing and lungs clear.  Will continue to monitor pt closely.

## 2014-02-10 NOTE — Progress Notes (Signed)
Physical Therapy Discharge Patient Details Name: Eric Schultz MRN: [id number] DOB: 21-Sep-1940 Today's Date: 02/10/2014 Time:  -     Patient discharged from PT services secondary to pt observed AMBULATING IN HALL ATTEMPTING TO LEAVE. PT to sign off. Please reorder PT to resume therapy services.  .    Progress and discharge plan discussed with patient and/or caregiver: patient previously had refused. GP     Marcelino Freestone PT [phone]  02/10/2014, 12:44 PM

## 2014-02-10 NOTE — Evaluation (Signed)
Occupational Therapy Evaluation Patient Details Name: Eric Schultz MRN: [id number] DOB: 1940-12-11 Today's Date: 02/10/2014    History of Present Illness car with elderly mother outside a closed store. pt currently dx with sepsis and acute encephalopathy   Clinical Impression   PT admitted with sepsis. Pt currently with functional limitiations due to the deficits listed below (see OT problem list). PTA independent helping care for mother per chart. Pt will benefit from skilled OT to increase their independence and safety with adls and balance to allow discharge SNF. Ot to follow acutely for cognitive deficits and adl retraining.     Follow Up Recommendations  SNF    Equipment Recommendations  None recommended by OT    Recommendations for Other Services       Precautions / Restrictions Precautions Precautions: Fall      Mobility Bed Mobility                  Transfers Overall transfer level: Needs assistance   Transfers: Sit to/from Stand Sit to Stand: Min guard         General transfer comment: pt holding pants due to inability to button pants. Question transfer with pants secured    Balance                                            ADL Overall ADL's : Needs assistance/impaired Eating/Feeding: Set up;Sitting   Grooming: Oral care;Wash/dry hands;Wash/dry face;Applying deodorant;Minimal assistance;Sitting Grooming Details (indicate cue type and reason): cues to complete task Upper Body Bathing: Set up;Sitting   Lower Body Bathing: Min guard;Sit to/from stand Lower Body Bathing Details (indicate cue type and reason): pt refusing to doff dirty smelly pants. pt reports "they are dirty" but refusing to doff even with paper scrubs offered Upper Body Dressing : Supervision/safety;Sitting       Toilet Transfer: Min guard             General ADL Comments: Pt eating lunch on arrival and agreeable to bathing. pt with poor recall of  agreement within 2 minutes. pt asking sevearl times during session , "now what are we doing?" "what can I help you with today?" pt requesting to complete actual shower in bathroom. Pt was not allowed this due to no order from MD and currently with IV in left hand. Please order shower if okay.     Vision                     Perception     Praxis      Pertinent Vitals/Pain DOE with alll mobility and task     Hand Dominance Right   Extremity/Trunk Assessment Upper Extremity Assessment Upper Extremity Assessment: Overall WFL for tasks assessed   Lower Extremity Assessment Lower Extremity Assessment: Defer to PT evaluation   Cervical / Trunk Assessment Cervical / Trunk Assessment: Normal   Communication Communication Communication: No difficulties   Cognition Arousal/Alertness: Awake/alert Behavior During Therapy: Impulsive;Restless Overall Cognitive Status: Impaired/Different from baseline Area of Impairment: Orientation;Attention;Memory;Following commands;Safety/judgement;Awareness;Problem solving Orientation Level: Disoriented to;Time;Situation Current Attention Level: Sustained Memory: Decreased recall of precautions Following Commands: Follows one step commands with increased time Safety/Judgement: Decreased awareness of safety Awareness: Emergent   General Comments: pt requesting to leave immediately   General Comments       Exercises       Shoulder Instructions  Home Living Family/patient expects to be discharged to:: Private residence Living Arrangements: Parent Available Help at Discharge: [place];Available PRN/intermittently                                    Prior Functioning/Environment Level of Independence: Independent        Comments: driving per notes. Unknown PTA due to cognitive deficits    OT Diagnosis: Generalized weakness;Cognitive deficits   OT Problem List: Decreased strength;Decreased  activity tolerance;Impaired balance (sitting and/or standing);Decreased cognition;Decreased knowledge of precautions;Decreased knowledge of use of DME or AE;Decreased safety awareness;Obesity;Cardiopulmonary status limiting activity   OT Treatment/Interventions: Self-care/ADL training;Therapeutic exercise;Therapeutic activities;Cognitive remediation/compensation;Patient/family education;Balance training    OT Goals(Current goals can be found in the care plan section) Acute Rehab OT Goals Patient Stated Goal: to leave today OT Goal Formulation: Patient unable to participate in goal setting Time For Goal Achievement: 02/24/14 Potential to Achieve Goals: Good  OT Frequency: Min 2X/week   Barriers to D/C:            Co-evaluation              End of Session Nurse Communication: Mobility status;Precautions  Activity Tolerance: Patient limited by fatigue (DOE noted) Patient left: in bed;with call bell/phone within reach;with bed alarm set   Time: 4665-9935 OT Time Calculation (min): 46 min Charges:  OT General Charges $OT Visit: 1 Procedure OT Evaluation $Initial OT Evaluation Tier I: 1 Procedure OT Treatments $Self Care/Home Management : 38-52 mins G-Codes:    Peri Maris 2014-03-01, 3:48 PM Pager: [phone]

## 2014-02-10 NOTE — Care Management Note (Signed)
    Page 1 of 1   02/12/2014     10:30:07 AM CARE MANAGEMENT NOTE 02/12/2014  Patient:  Eric Schultz, Eric Schultz   Account Number:  [id number]  Date Initiated:  02/10/2014  Documentation initiated by:  AMERSON,JULIE  Subjective/Objective Assessment:   pt adm on 02/09/14 with confusion, fever.  PTA, pt independent of ADLS.     Action/Plan:   Will follow for dc planning as pt progresses.   Anticipated DC Date:  02/14/2014   Anticipated DC Plan:  [place]  CM consult      Choice offered to / List presented to:             Status of service:  Completed, signed off Medicare Important Message given?   (If response is "NO", the following Medicare IM given date fields will be blank) Date Medicare IM given:   Date Additional Medicare IM given:    Discharge Disposition:  HOME/SELF CARE  Per UR Regulation:  Reviewed for med. necessity/level of care/duration of stay  If discussed at [place] of Stay Meetings, dates discussed:    Comments:  02/12/14 10:00 CM spoke with pt and suggested he look on the back of his [facility] insurance card and call the 888 number on Monday and request a listing of Primary Care Physicians in his zip code area.  Pt verbalizes understanding if he stays witin his network, he will have a lower copay.  Reminder of this put on his discharge instructions.  No other CM needs were communicated.  Mariane Masters, BSN, CM [phone].

## 2014-02-10 NOTE — Progress Notes (Signed)
PT Cancellation Note  Patient Details Name: Eric Schultz MRN: [id number] DOB: 05/27/1941   Cancelled Treatment:    Reason Eval/Treat Not Completed: Patient declined. Pt stated:Under no circumstances am I ready for Physical Therapy, not today, not tomorrow."( RN reports pt was pleasant earlier and asks for PT to return tomorrow.   Claretha Cooper 02/10/2014, 12:15 PM Tresa Endo PT [phone]

## 2014-02-10 NOTE — Progress Notes (Signed)
Pt became agitated unexpectedly. Pt pulled iv access out, tele and put on his clothes. Pt walked to the hospital parking lot. Security and Dr. Coralyn Pear was called.

## 2014-02-10 NOTE — Progress Notes (Signed)
Pt without IV access. RN paged IV team to start IV on pt. IV team attempted to start IV on pt but attempts were unsuccessful. Pt got frustrated with IV teams attempts and refused to allow anyone else to try. Pt then got up and walked out of room saying "Im going home, Im leaving this hospital". RN and NT followed pt out and called security. Pt walked our of the hospital through the heart and vascular center. [employer] and security escorted pt back inside hospital and to pts room. RN explained to pt the importance of having an IV to receive his antibiotics due to his previous AMS and fever, pt stated "I have these episodes all the time, I don't need an IV". RN gave pt 2 mg of IM haldol once pt got back inside room. Pt is less agitated and is now resting with call bell within reach and safety sitter at beside. Will continue to monitor.  Coolidge Breeze, RN

## 2014-02-10 NOTE — Progress Notes (Signed)
OT Cancellation Note  Patient Details Name: Eric Schultz MRN: [id number] DOB: March 12, 1941   Cancelled Treatment:    Reason Eval/Treat Not Completed: OT arrived to room and pt not present.  Spoke with nursing staff who report pt ambulated off unit and was attempting to leave hospital.  MD has gone to find pt and talk with him.     02/10/2014 Darrol Jump OTR/L Pager [phone] Office [phone]

## 2014-02-10 NOTE — Clinical Social Work Psych Note (Signed)
Psych CSW assisted MD with completion of Involuntary Commitment paperwork (IVC).  Psych CSW faxed them to the Magistrate and requested pt be served.  Pt is currently IVC'd and may not leave the hospital without the rescention of said paperwork.  Please contact psych CSW once medically ready to dc.  Paperwork expires 02/17/2014.  Nonnie Done, [place] [phone]  Clinical Social Work

## 2014-02-10 NOTE — Progress Notes (Signed)
New iv placed. Ativan 1mg  given. Pt is calm now and resting.

## 2014-02-10 NOTE — Progress Notes (Signed)
[facility] PROGRESS NOTE  LESLIE LANGILLE ZHG:[id number] DOB: 29-Aug-1940 DOA: 02/09/2014 PCP: No primary provider on file.  Assessment/Plan: 1. Sepsis -Present on admission, evidenced by temperature 103, white blood cell count of 12.1, heart rate of 196, having metabolic encephalopathy -Source of infection could be coming from the left lower extremity cellulitis. Chest x-ray did not reveal obvious infiltrate and urinalysis was negative. He improved after the administration of IV fluids and IV antimicrobial therapy -Blood cultures are pending -Given the overall improvement will continue current antimicrobial regimen  2.  Metabolic encephalopathy -Likely related to sepsis on presentation -Improving this morning, appearing to be awake alert and oriented x3 -Continue IV fluids, IV antimicrobial therapy  3.  Hypokalemia -A.m. lab work showing a potassium level of 3.3, will provide oral potassium replacement  4.  Thrombocytopenia -I suspect related to sepsis, although earlier this year labs revealed platelet count of 90,000 -No evidence of active bleed  5.  Left lower extremity cellulitis -Patient having erythema involving his left lower extremity, consistent with cellulitis. -Suspect this may the source of sepsis -Continue broad-spectrum IV antimicrobial therapy with vancomycin and Zosyn  Code Status: Full  Family Communication:  Disposition Plan: IV antibiotic therapy, supportive care   Consultants:  PT/OT   Antibiotics:  Zosyn  Vancomycin  HPI/Subjective: Patient is a 73 year old gentleman with a past medical history of hypertension, admitted to the medicine service on 02/09/2014 presenting with mental status changes, found to have erythema and swelling involving left lower extremity. He was found to be febrile have a temperature 103 with white count of 12,100 and tachycardic at 127, meeting criteria for sepsis, it was felt source of infection could be from left lower  extremity. He was started on broad-spectrum IV antibiotic therapy with vancomycin and Zosyn. This morning he appears improved, awake alert and oriented x3, tolerating PO intake  Objective: Filed Vitals:   02/10/14 0535  BP: 125/52  Pulse: 64  Temp: 98.7 F (37.1 C)  Resp: 20    Intake/Output Summary (Last 24 hours) at 02/10/14 0944 Last data filed at 02/10/14 0818  Gross per 24 hour  Intake    120 ml  Output    900 ml  Net   -780 ml   Filed Weights   02/09/14 2240 02/09/14 2350 02/10/14 0535  Weight: 127.7 kg (281 lb 8.4 oz) 132.4 kg (291 lb 14.2 oz) 133.539 kg (294 lb 6.4 oz)    Exam:   General:  No acute distress, is awake and alert, following commands, sat up to have his breakfast  Cardiovascular: Regular arte and rhythm, normal S1S2  Respiratory: Clear to auscultation bilaterally  Abdomen: Soft. Nontender, nondistended  Musculoskeletal: Venous stasis changes involving bilateral lower extremities. There is erythema and some swelling over left lower extremity as well as inner thigh  Data Reviewed: Basic Metabolic Panel:  Recent Labs Lab 02/09/14 1755 02/09/14 1812 02/10/14 0125  NA 139 142 137  K 3.7 3.5* 3.3*  CL 102 102 99  CO2 24  --  23  GLUCOSE 114* 114* 120*  BUN 13 14 13   CREATININE 1.13 1.20 0.98  CALCIUM 9.2  --  9.0   Liver Function Tests:  Recent Labs Lab 02/09/14 1755  AST 32  ALT 25  ALKPHOS 100  BILITOT 1.0  PROT 8.0  ALBUMIN 4.0   No results found for this basename: LIPASE, AMYLASE,  in the last 168 hours No results found for this basename: AMMONIA,  in the last 168 hours  CBC:  Recent Labs Lab 02/09/14 1755 02/09/14 1812 02/10/14 0125  WBC 12.1*  --  12.3*  HGB 14.7 16.0 13.5  HCT 42.4 47.0 38.6*  MCV 94.2  --  93.7  PLT 97*  --  87*   Cardiac Enzymes:  Recent Labs Lab 02/09/14 1755  CKTOTAL 103   BNP (last 3 results) No results found for this basename: PROBNP,  in the last 8760 hours CBG:  Recent Labs Lab  02/09/14 1732 02/10/14 0303  GLUCAP 110* 120*    No results found for this or any previous visit (from the past 240 hour(s)).   Studies: Dg Chest Port 1 View  02/09/2014   CLINICAL DATA:  Fever.  Altered mental status.  EXAM: PORTABLE CHEST - 1 VIEW  COMPARISON:  None.  FINDINGS: The chest x-ray is limited by the patient's body habitus and position.  The heart is enlarged in the mediastinal and hilar contours are prominent. There is vascular congestion and probable mild interstitial edema. Possible pleural effusions with areas of atelectasis.  IMPRESSION: Limited examination but suspect CHF with small effusions and areas of atelectasis.   Electronically Signed   By: Kalman Jewels M.D.   On: 02/09/2014 20:42    Scheduled Meds: . enoxaparin (LOVENOX) injection  40 mg Subcutaneous Q24H  . piperacillin-tazobactam  3.375 g Intravenous Q8H  . potassium chloride  40 mEq Oral Q6H  . sodium chloride  3 mL Intravenous Q12H  . vancomycin (VANCOCIN) 1250 mg IVPB  1,250 mg Intravenous Q12H   Continuous Infusions: . sodium chloride 75 mL/hr at 02/09/14 2356    Principal Problem:   Sepsis Active Problems:   Fever   Hypokalemia   Thrombocytopenia    Time spent: 35 min    [name]  [facility] Pager [phone]. If 7PM-7AM, please contact night-coverage at [email or web address], password [facility] 02/10/2014, 9:44 AM  LOS: 1 day

## 2014-02-11 DIAGNOSIS — I379 Nonrheumatic pulmonary valve disorder, unspecified: Secondary | ICD-10-CM

## 2014-02-11 DIAGNOSIS — R404 Transient alteration of awareness: Secondary | ICD-10-CM

## 2014-02-11 DIAGNOSIS — G9341 Metabolic encephalopathy: Secondary | ICD-10-CM

## 2014-02-11 LAB — CBC
HCT: 39 % (ref 39.0–52.0)
Hemoglobin: 13.6 g/dL (ref 13.0–17.0)
MCH: 32.5 pg (ref 26.0–34.0)
MCHC: 34.9 g/dL (ref 30.0–36.0)
MCV: 93.1 fL (ref 78.0–100.0)
PLATELETS: 91 10*3/uL — AB (ref 150–400)
RBC: 4.19 MIL/uL — AB (ref 4.22–5.81)
RDW: 13.5 % (ref 11.5–15.5)
WBC: 7.7 10*3/uL (ref 4.0–10.5)

## 2014-02-11 LAB — BASIC METABOLIC PANEL
BUN: 12 mg/dL (ref 6–23)
CALCIUM: 8.9 mg/dL (ref 8.4–10.5)
CO2: 21 meq/L (ref 19–32)
Chloride: 103 mEq/L (ref 96–112)
Creatinine, Ser: 0.96 mg/dL (ref 0.50–1.35)
GFR calc Af Amer: >90 mL/min (ref >90)
GFR calc non Af Amer: 81 mL/min — ABNORMAL LOW (ref >90)
GLUCOSE: 101 mg/dL — AB (ref 70–99)
Potassium: 3.7 mEq/L (ref 3.7–5.3)
Sodium: 141 mEq/L (ref 137–147)

## 2014-02-11 LAB — URINE CULTURE: Colony Count: 4000

## 2014-02-11 LAB — PROCALCITONIN: Procalcitonin: 0.87 ng/mL

## 2014-02-11 MED ORDER — CLINDAMYCIN HCL 300 MG PO CAPS
300.0000 mg | ORAL_CAPSULE | Freq: Four times a day (QID) | ORAL | Status: DC
  Administered 2014-02-11 – 2014-02-12 (×4): 300 mg via ORAL
  Filled 2014-02-11 (×8): qty 1

## 2014-02-11 NOTE — Progress Notes (Signed)
Weekend CSW faxed IVC paperwork to magistrate and called to confirm receipt. IVC paperwork on patient chart. Please contact weekend CSW at [phone] if further questions or concerns regarding IVC.  Tilden Fossa, MSW, [place] Clinical Social Worker [facility] Emergency Dept. [phone]

## 2014-02-11 NOTE — Progress Notes (Signed)
[facility] PROGRESS NOTE  Eric Schultz [id number] DOB: 1941/06/07 DOA: 02/09/2014 PCP: No primary Eric Schultz on file.  Assessment/Plan: 1. Sepsis -Present on admission, evidenced by temperature 103, white blood cell count of 12.1, heart rate of 272, having metabolic encephalopathy -Source of infection could be coming from the left lower extremity cellulitis. Chest x-ray did not reveal obvious infiltrate and urinalysis was negative. He improved after the administration of IV fluids and IV antimicrobial therapy -Blood cultures in process  2.  Metabolic encephalopathy -Likely related to sepsis/delirium on presentation, attempted to leave hospital yesterday abruptly. Could not give a reason why he was leaving , neither could he state his diagnosis or the consequences of not receiving medical treatment. Placed under IVC -Seems better this morning. Tells me he can not remember how he got to the hospital. -Continue IV fluids, IV antimicrobial therapy, supportive care  3.  Hypokalemia -Resolved  4.  Thrombocytopenia -I suspect related to sepsis, although earlier this year labs revealed platelet count of 90,000 -No evidence of active bleed   5.  Left lower extremity cellulitis -Patient having erythema involving his left lower extremity, consistent with cellulitis. -Suspect this may the source of sepsis -Leg appears improved -Continue broad-spectrum IV antimicrobial therapy with vancomycin and Zosyn  Code Status: Full  Family Communication:  Disposition Plan: IV antibiotic therapy, supportive care   Consultants:  PT/OT   Antibiotics:  Zosyn  Vancomycin  HPI/Subjective: Patient is a 73 year old gentleman with a past medical history of hypertension, admitted to the medicine service on 02/09/2014 presenting with mental status changes, found to have erythema and swelling involving left lower extremity. He was found to be febrile have a temperature 103 with white count of  12,100 and tachycardic at 127, meeting criteria for sepsis, it was felt source of infection could be from left lower extremity. He was started on broad-spectrum IV antibiotic therapy with vancomycin and Zosyn. This morning he appears improved, awake alert and oriented x3, tolerating PO intake  Patient is calm and cooperative during my encounter, states he feels better  Objective: Filed Vitals:   02/11/14 0400  BP: 160/83  Pulse: 64  Temp: 98.2 F (36.8 C)  Resp: 21    Intake/Output Summary (Last 24 hours) at 02/11/14 0923 Last data filed at 02/11/14 5366  Gross per 24 hour  Intake   2030 ml  Output      0 ml  Net   2030 ml   Filed Weights   02/09/14 2350 02/10/14 0535 02/11/14 0553  Weight: 132.4 kg (291 lb 14.2 oz) 133.539 kg (294 lb 6.4 oz) 132.4 kg (291 lb 14.2 oz)    Exam:   General:  No acute distress, is awake and alert, following commands, sat up to have his breakfast  Cardiovascular: Regular arte and rhythm, normal S1S2  Respiratory: Clear to auscultation bilaterally  Abdomen: Soft. Nontender, nondistended  Musculoskeletal: Venous stasis changes involving bilateral lower extremities. Improved erythema  Data Reviewed: Basic Metabolic Panel:  Recent Labs Lab 02/09/14 1755 02/09/14 1812 02/10/14 0125 02/11/14 0355  NA 139 142 137 141  K 3.7 3.5* 3.3* 3.7  CL 102 102 99 103  CO2 24  --  23 21  GLUCOSE 114* 114* 120* 101*  BUN 13 14 13 12   CREATININE 1.13 1.20 0.98 0.96  CALCIUM 9.2  --  9.0 8.9   Liver Function Tests:  Recent Labs Lab 02/09/14 1755  AST 32  ALT 25  ALKPHOS 100  BILITOT 1.0  PROT  8.0  ALBUMIN 4.0   No results found for this basename: LIPASE, AMYLASE,  in the last 168 hours No results found for this basename: AMMONIA,  in the last 168 hours CBC:  Recent Labs Lab 02/09/14 1755 02/09/14 1812 02/10/14 0125 02/11/14 0355  WBC 12.1*  --  12.3* 7.7  HGB 14.7 16.0 13.5 13.6  HCT 42.4 47.0 38.6* 39.0  MCV 94.2  --  93.7 93.1   PLT 97*  --  87* 91*   Cardiac Enzymes:  Recent Labs Lab 02/09/14 1755  CKTOTAL 103   BNP (last 3 results) No results found for this basename: PROBNP,  in the last 8760 hours CBG:  Recent Labs Lab 02/09/14 1732 02/10/14 0303  GLUCAP 110* 120*    Recent Results (from the past 240 hour(s))  URINE CULTURE     Status: None   Collection Time    02/09/14  7:54 PM      Result Value Ref Range Status   Specimen Description URINE, CLEAN CATCH   Final   Special Requests NONE   Final   Culture  Setup Time     Final   Value: 02/10/2014 00:19     Performed at [place]     Final   Value: 4,000 COLONIES/ML     Performed at [facility]   Culture     Final   Value: INSIGNIFICANT GROWTH     Performed at [facility]   Report Status 02/11/2014 FINAL   Final     Studies: Dg Chest Port 1 View  02/09/2014   CLINICAL DATA:  Fever.  Altered mental status.  EXAM: PORTABLE CHEST - 1 VIEW  COMPARISON:  None.  FINDINGS: The chest x-ray is limited by the patient's body habitus and position.  The heart is enlarged in the mediastinal and hilar contours are prominent. There is vascular congestion and probable mild interstitial edema. Possible pleural effusions with areas of atelectasis.  IMPRESSION: Limited examination but suspect CHF with small effusions and areas of atelectasis.   Electronically Signed   By: Kalman Jewels M.D.   On: 02/09/2014 20:42    Scheduled Meds: . enoxaparin (LOVENOX) injection  40 mg Subcutaneous Q24H  . piperacillin-tazobactam  3.375 g Intravenous Q8H  . sodium chloride  3 mL Intravenous Q12H  . vancomycin (VANCOCIN) 1250 mg IVPB  1,250 mg Intravenous Q12H   Continuous Infusions:    Principal Problem:   Sepsis Active Problems:   Fever   Hypokalemia   Thrombocytopenia    Time spent: 25 min    Eric Schultz  [facility] Pager [phone]. If 7PM-7AM, please contact night-coverage at [email or web address],  password [facility] 02/11/2014, 9:23 AM  LOS: 2 days

## 2014-02-11 NOTE — Progress Notes (Signed)
Echocardiogram 2D Echocardiogram has been performed.  Joelene Millin 02/11/2014, 10:09 AM

## 2014-02-12 LAB — BASIC METABOLIC PANEL
BUN: 14 mg/dL (ref 6–23)
CO2: 23 mEq/L (ref 19–32)
Calcium: 9.2 mg/dL (ref 8.4–10.5)
Chloride: 106 mEq/L (ref 96–112)
Creatinine, Ser: 0.88 mg/dL (ref 0.50–1.35)
GFR calc Af Amer: >90 mL/min (ref >90)
GFR calc non Af Amer: 84 mL/min — ABNORMAL LOW (ref >90)
Glucose, Bld: 94 mg/dL (ref 70–99)
Potassium: 3.5 mEq/L — ABNORMAL LOW (ref 3.7–5.3)
Sodium: 143 mEq/L (ref 137–147)

## 2014-02-12 LAB — CBC
HEMATOCRIT: 38.6 % — AB (ref 39.0–52.0)
Hemoglobin: 13.4 g/dL (ref 13.0–17.0)
MCH: 32.4 pg (ref 26.0–34.0)
MCHC: 34.7 g/dL (ref 30.0–36.0)
MCV: 93.5 fL (ref 78.0–100.0)
Platelets: 91 10*3/uL — ABNORMAL LOW (ref 150–400)
RBC: 4.13 MIL/uL — ABNORMAL LOW (ref 4.22–5.81)
RDW: 13.9 % (ref 11.5–15.5)
WBC: 5.4 10*3/uL (ref 4.0–10.5)

## 2014-02-12 MED ORDER — CLINDAMYCIN HCL 300 MG PO CAPS: 300.0000 mg | ORAL_CAPSULE | Freq: Three times a day (TID) | ORAL | Status: DC

## 2014-02-12 NOTE — Accreditation Note (Signed)
Pt. Left with out a ride said " I will walk home " did not wait for discharge papers saying " the doctor said I can go home"  Called and informed doctor he gave no new orders

## 2014-02-12 NOTE — Discharge Summary (Signed)
Physician Discharge Summary  Eric Schultz JKK:[id number] DOB: 1941-05-10 DOA: 02/09/2014  PCP: No primary provider on file.  Admit date: 02/09/2014 Discharge date: 02/12/2014  Time spent: 35 minutes  Recommendations for Outpatient Follow-up:  1. Please follow up on patient's cellulitis  Discharge Diagnoses:  Principal Problem:   Sepsis Active Problems:   Fever   Hypokalemia   Thrombocytopenia   Discharge Condition: Stable/Improved  Diet recommendation: Regular Diet  Filed Weights   02/10/14 0535 02/11/14 0553 02/12/14 0748  Weight: 133.539 kg (294 lb 6.4 oz) 132.4 kg (291 lb 14.2 oz) 131 kg (288 lb 12.8 oz)    History of present illness:  Pt is 73 yo male with no known history, who was brought in to [facility] ED via EMS after noted to be confused. Please note that pt is unable to provide any history on arrival to the ED or upon admission. He was apparently found in the car with his mother at a convenience store that was not even open. Mother at bedside said it was very hot in the car but she is also unable to provide history. No reported chest pain or shortness of breath.   Hospital Course:  Patient is a pleasant 73 year old gentleman who was admitted to the medicine service on 02/09/2014 presenting with complaints of mental status changes, confusion, found to have cellulitis involving left lower extremity on presentation. By criteria of septic, presenting with a temperature 103, white count of 12,100 and tachycardia have a heart rate of 127. He was started on broad-spectrum IV antimicrobial therapy with Vancomycin and Zosyn. On the following day patient abruptly walked out of hospital, found to be wandering in parking lot. I met him out there as he could not tell me why he was leaving so abruptly, nor could he tell me his diagnosis or the consequences of not receiving medical therapy. I felt that at the time he did not have the capacity to make his own medical decisions thus he was  involuntarily committed. Patient showed clinical improvement over the following days, remain calm, cooperative, able to participate in his own plan of care. He was transitioned to oral clindamycin on 02/11/2014. On 02/12/2014 patient was pleasant, cooperative, remained hemodynamically stable, and had been afebrile for the past 48 hours. He was discharged in stable condition to his home on 6 09/25/2013.   Discharge Exam: Filed Vitals:   02/12/14 0352  BP: 136/66  Pulse: 56  Temp: 97.5 F (36.4 C)  Resp: 18    General: Sitting comfortably in no acute distress, awake and alert Cardiovascular: Regular rate and rhythm, normal S1S2 Respiratory: Clear to auscultation bilaterally, normal inspiratory effort Skin: Resolution to cellulitis over his left shin, there is improved erythema over his left thigh region  Discharge Instructions You were cared for by a hospitalist during your hospital stay. If you have any questions about your discharge medications or the care you received while you were in the hospital after you are discharged, you can call the unit and asked to speak with the hospitalist on call if the hospitalist that took care of you is not available. Once you are discharged, your primary care physician will handle any further medical issues. Please note that NO REFILLS for any discharge medications will be authorized once you are discharged, as it is imperative that you return to your primary care physician (or establish a relationship with a primary care physician if you do not have one) for your aftercare needs so that they can  reassess your need for medications and monitor your lab values.  Discharge Instructions   Call MD for:  difficulty breathing, headache or visual disturbances    Complete by:  As directed      Call MD for:  extreme fatigue    Complete by:  As directed      Call MD for:  persistant dizziness or light-headedness    Complete by:  As directed      Call MD for:   persistant nausea and vomiting    Complete by:  As directed      Call MD for:  redness, tenderness, or signs of infection (pain, swelling, redness, odor or green/yellow discharge around incision site)    Complete by:  As directed      Call MD for:  temperature >100.4    Complete by:  As directed      Diet - low sodium heart healthy    Complete by:  As directed      Discharge instructions    Complete by:  As directed   Please follow up with your family physician in 1 week     Increase activity slowly    Complete by:  As directed             Medication List         aspirin 325 MG tablet  Take 650 mg by mouth every 4 (four) hours as needed for mild pain.     clindamycin 300 MG capsule  Commonly known as:  CLEOCIN  Take 1 capsule (300 mg total) by mouth 3 (three) times daily.       Allergies  Allergen Reactions  . Sulfa Antibiotics Nausea And Vomiting      The results of significant diagnostics from this hospitalization (including imaging, microbiology, ancillary and laboratory) are listed below for reference.    Significant Diagnostic Studies: Dg Chest Port 1 View  02/09/2014   CLINICAL DATA:  Fever.  Altered mental status.  EXAM: PORTABLE CHEST - 1 VIEW  COMPARISON:  None.  FINDINGS: The chest x-ray is limited by the patient's body habitus and position.  The heart is enlarged in the mediastinal and hilar contours are prominent. There is vascular congestion and probable mild interstitial edema. Possible pleural effusions with areas of atelectasis.  IMPRESSION: Limited examination but suspect CHF with small effusions and areas of atelectasis.   Electronically Signed   By: Kalman Jewels M.D.   On: 02/09/2014 20:42    Microbiology: Recent Results (from the past 240 hour(s))  CULTURE, BLOOD (ROUTINE X 2)     Status: None   Collection Time    02/09/14  5:55 PM      Result Value Ref Range Status   Specimen Description BLOOD ARM RIGHT   Final   Special Requests BOTTLES DRAWN  AEROBIC AND ANAEROBIC 5CC   Final   Culture  Setup Time     Final   Value: 02/10/2014 04:50     Performed at [facility]   Culture     Final   Value:        BLOOD CULTURE RECEIVED NO GROWTH TO DATE CULTURE WILL BE HELD FOR 5 DAYS BEFORE ISSUING A FINAL NEGATIVE REPORT     Performed at [facility]   Report Status PENDING   Incomplete  CULTURE, BLOOD (ROUTINE X 2)     Status: None   Collection Time    02/09/14  6:03 PM  Result Value Ref Range Status   Specimen Description BLOOD ARM LEFT   Final   Special Requests BOTTLES DRAWN AEROBIC AND ANAEROBIC 10CC   Final   Culture  Setup Time     Final   Value: 02/10/2014 04:50     Performed at [facility]   Culture     Final   Value:        BLOOD CULTURE RECEIVED NO GROWTH TO DATE CULTURE WILL BE HELD FOR 5 DAYS BEFORE ISSUING A FINAL NEGATIVE REPORT     Performed at [facility]   Report Status PENDING   Incomplete  URINE CULTURE     Status: None   Collection Time    02/09/14  7:54 PM      Result Value Ref Range Status   Specimen Description URINE, CLEAN CATCH   Final   Special Requests NONE   Final   Culture  Setup Time     Final   Value: 02/10/2014 00:19     Performed at [place]     Final   Value: 4,000 COLONIES/ML     Performed at [facility]   Culture     Final   Value: INSIGNIFICANT GROWTH     Performed at [facility]   Report Status 02/11/2014 FINAL   Final  CULTURE, BLOOD (ROUTINE X 2)     Status: None   Collection Time    02/10/14  1:25 AM      Result Value Ref Range Status   Specimen Description BLOOD RIGHT ARM   Final   Special Requests BOTTLES DRAWN AEROBIC ONLY 5CC   Final   Culture  Setup Time     Final   Value: 02/10/2014 08:26     Performed at [facility]   Culture     Final   Value:        BLOOD CULTURE RECEIVED NO GROWTH TO DATE CULTURE WILL BE HELD FOR 5 DAYS BEFORE ISSUING A FINAL NEGATIVE REPORT     Performed at  [facility]   Report Status PENDING   Incomplete  CULTURE, BLOOD (ROUTINE X 2)     Status: None   Collection Time    02/10/14  1:38 AM      Result Value Ref Range Status   Specimen Description BLOOD RIGHT HAND   Final   Special Requests BOTTLES DRAWN AEROBIC ONLY 3CC   Final   Culture  Setup Time     Final   Value: 02/10/2014 08:28     Performed at [facility]   Culture     Final   Value:        BLOOD CULTURE RECEIVED NO GROWTH TO DATE CULTURE WILL BE HELD FOR 5 DAYS BEFORE ISSUING A FINAL NEGATIVE REPORT     Performed at [facility]   Report Status PENDING   Incomplete     Labs: Basic Metabolic Panel:  Recent Labs Lab 02/09/14 1755 02/09/14 1812 02/10/14 0125 02/11/14 0355 02/12/14 0347  NA 139 142 137 141 143  K 3.7 3.5* 3.3* 3.7 3.5*  CL 102 102 99 103 106  CO2 24  --  _0 GLUCOSE 114* 114* 120* 101* 94  BUN _1 CREATININE 1.13 1.20 0.98 0.96 0.88  CALCIUM 9.2  --  9.0 8.9 9.2   Liver Function Tests:  Recent Labs Lab 02/09/14 1755  AST 32  ALT 25  ALKPHOS 100  BILITOT 1.0  PROT 8.0  ALBUMIN 4.0   No results found for this basename: LIPASE, AMYLASE,  in the last 168 hours No results found for this basename: AMMONIA,  in the last 168 hours CBC:  Recent Labs Lab 02/09/14 1755 02/09/14 1812 02/10/14 0125 02/11/14 0355 02/12/14 0347  WBC 12.1*  --  12.3* 7.7 5.4  HGB 14.7 16.0 13.5 13.6 13.4  HCT 42.4 47.0 38.6* 39.0 38.6*  MCV 94.2  --  93.7 93.1 93.5  PLT 97*  --  87* 91* 91*   Cardiac Enzymes:  Recent Labs Lab 02/09/14 1755  CKTOTAL 103   BNP: BNP (last 3 results) No results found for this basename: PROBNP,  in the last 8760 hours CBG:  Recent Labs Lab 02/09/14 1732 02/10/14 0303  GLUCAP 110* 120*       Signed:  ZAMORA, EZEQUIEL  [facility] 02/12/2014, 9:35 AM

## 2014-02-12 NOTE — Progress Notes (Signed)
Follow up note  I was informed by Nursing Staff that he walked out of room, did not wait for discharge paperwork or for Case Manager to give him a follow up appointment for PCP.

## 2014-02-12 NOTE — Progress Notes (Addendum)
Patient with DC orders home per MD, safety  sitter D/C'd, discussed with patient that I  will attempt to call his cousin Germain Osgood to provide a ride home . Within 2 minutes the patient with his  belongings and at the nursing station stating "the doctor told me i was discharged and I'm going to walk home. Refused to wait for [place] to contact family or arrange ride home. Dr. Coralyn Pear notified regarding the situation. Patient refused to wait for discharge instructions also. Discussed situation with the [facility] Bertell Maria, Joelene Millin A

## 2014-02-16 LAB — CULTURE, BLOOD (ROUTINE X 2)
CULTURE: NO GROWTH
CULTURE: NO GROWTH
Culture: NO GROWTH
Culture: NO GROWTH

## 2014-08-07 ENCOUNTER — Emergency Department (INDEPENDENT_AMBULATORY_CARE_PROVIDER_SITE_OTHER)
Admission: EM | Admit: 2014-08-07 | Discharge: 2014-08-07 | Disposition: A | Discharge status: Home / Self Care | Payer: Medicare Other | Source: Home / Self Care | Attending: Emergency Medicine | Admitting: Emergency Medicine

## 2014-08-07 ENCOUNTER — Encounter (HOSPITAL_COMMUNITY): Payer: Self-pay | Admitting: Emergency Medicine

## 2014-08-07 DIAGNOSIS — M10071 Idiopathic gout, right ankle and foot: Secondary | ICD-10-CM

## 2014-08-07 DIAGNOSIS — M109 Gout, unspecified: Secondary | ICD-10-CM

## 2014-08-07 HISTORY — DX: Sleep apnea, unspecified: G47.30

## 2014-08-07 HISTORY — DX: Gout, unspecified: M10.9

## 2014-08-07 MED ORDER — HYDROCODONE-ACETAMINOPHEN 5-325 MG PO TABS: ORAL_TABLET | ORAL | Status: DC

## 2014-08-07 MED ORDER — INDOMETHACIN 50 MG PO CAPS: 50.0000 mg | ORAL_CAPSULE | Freq: Three times a day (TID) | ORAL | Status: DC

## 2014-08-07 NOTE — ED Provider Notes (Signed)
  Chief Complaint   Gout   History of Present Illness   Eric Schultz is a 73 year old male with a long history of gout.  Currently has a typical attack in right mid foot area.  No trauma.  No fever or chills.  Has gotten relief with indomethacin in past.   Review of Systems   Other than as noted above, the patient denies any of the following symptoms: Systemic:  No fevers or chills. Musculoskeletal:  No joint pain or arthritis.  Neurological:  No muscular weakness, paresthesias.   [place]   Past medical history, family history, social history, meds, and allergies were reviewed.     Physical  Examination     Vital signs:  BP 162/83 mmHg  Pulse 55  Temp(Src) 97.4 F (36.3 C) (Oral)  Resp 18  SpO2 96% Gen:  Alert and oriented times 3.  In no distress. Musculoskeletal:  Exam of the foot reveals pain to palpation over dorsum of foot, but little swelling or erythma.  Otherwise, all joints had a full a ROM with no swelling, bruising or deformity.  No edema, pulses full. Extremities were warm and pink.  Capillary refill was brisk.  Skin:  Clear, warm and dry.  No rash. Neuro:  Alert and oriented times 3.  Muscle strength was normal.  Sensation was intact to light touch.   Assessment   The encounter diagnosis was Acute gout of right foot, unspecified cause.  Plan    1.  Meds:  The following meds were prescribed:   Discharge Medication List as of 08/07/2014 11:19 AM    START taking these medications   Details  HYDROcodone-acetaminophen (NORCO/VICODIN) 5-325 MG per tablet 1 to 2 tabs every 4 to 6 hours as needed for pain., Print    indomethacin (INDOCIN) 50 MG capsule Take 1 capsule (50 mg total) by mouth 3 (three) times daily with meals., Starting 08/07/2014, Until Discontinued, Normal        2.  Patient Education/Counseling:  The patient was given appropriate handouts, self care instructions, and instructed in symptomatic relief including rest and activity, elevation,  application of ice and compression.  Advised to take indomethacin with meals and to take omeprazole along with it.  3.  Follow up:  The patient was told to follow up here if no better in 3 to 4 days, or sooner if becoming worse in any way, and given some red flag symptoms such as worsening pain or neurological symptoms which would prompt immediate return.       Harden Mo, MD 08/07/14 [phone]

## 2014-08-07 NOTE — ED Notes (Signed)
Onset last night of ankle pain, patient feels this is gout

## 2014-08-07 NOTE — Discharge Instructions (Signed)
Gout Gout is an inflammatory arthritis caused by a buildup of uric acid crystals in the joints. Uric acid is a chemical that is normally present in the blood. When the level of uric acid in the blood is too high it can form crystals that deposit in your joints and tissues. This causes joint redness, soreness, and swelling (inflammation). Repeat attacks are common. Over time, uric acid crystals can form into masses (tophi) near a joint, destroying bone and causing disfigurement. Gout is treatable and often preventable. CAUSES  The disease begins with elevated levels of uric acid in the blood. Uric acid is produced by your body when it breaks down a naturally found substance called purines. Certain foods you eat, such as meats and fish, contain high amounts of purines. Causes of an elevated uric acid level include: 1. Being passed down from parent to child (heredity). 2. Diseases that cause increased uric acid production (such as obesity, psoriasis, and certain cancers). 3. Excessive alcohol use. 4. Diet, especially diets rich in meat and seafood. 5. Medicines, including certain cancer-fighting medicines (chemotherapy), water pills (diuretics), and aspirin. 6. Chronic kidney disease. The kidneys are no longer able to remove uric acid well. 7. Problems with metabolism. Conditions strongly associated with gout include: 1. Obesity. 2. High blood pressure. 3. High cholesterol. 4. Diabetes. Not everyone with elevated uric acid levels gets gout. It is not understood why some people get gout and others do not. Surgery, joint injury, and eating too much of certain foods are some of the factors that can lead to gout attacks. SYMPTOMS   An attack of gout comes on quickly. It causes intense pain with redness, swelling, and warmth in a joint.  Fever can occur.  Often, only one joint is involved. Certain joints are more commonly involved:  Base of the big  toe.  Knee.  Ankle.  Wrist.  Finger. Without treatment, an attack usually goes away in a few days to weeks. Between attacks, you usually will not have symptoms, which is different from many other forms of arthritis. DIAGNOSIS  Your caregiver will suspect gout based on your symptoms and exam. In some cases, tests may be recommended. The tests may include:  Blood tests.  Urine tests.  X-rays.  Joint fluid exam. This exam requires a needle to remove fluid from the joint (arthrocentesis). Using a microscope, gout is confirmed when uric acid crystals are seen in the joint fluid. TREATMENT  There are two phases to gout treatment: treating the sudden onset (acute) attack and preventing attacks (prophylaxis).  Treatment of an Acute Attack.  Medicines are used. These include anti-inflammatory medicines or steroid medicines.  An injection of steroid medicine into the affected joint is sometimes necessary.  The painful joint is rested. Movement can worsen the arthritis.  You may use warm or cold treatments on painful joints, depending which works best for you.  Treatment to Prevent Attacks.  If you suffer from frequent gout attacks, your caregiver may advise preventive medicine. These medicines are started after the acute attack subsides. These medicines either help your kidneys eliminate uric acid from your body or decrease your uric acid production. You may need to stay on these medicines for a very long time.  The early phase of treatment with preventive medicine can be associated with an increase in acute gout attacks. For this reason, during the first few months of treatment, your caregiver may also advise you to take medicines usually used for acute gout treatment. Be sure you  understand your caregiver's directions. Your caregiver may make several adjustments to your medicine dose before these medicines are effective.  Discuss dietary treatment with your caregiver or dietitian.  Alcohol and drinks high in sugar and fructose and foods such as meat, poultry, and seafood can increase uric acid levels. Your caregiver or dietitian can advise you on drinks and foods that should be limited. HOME CARE INSTRUCTIONS   Do not take aspirin to relieve pain. This raises uric acid levels.  Only take over-the-counter or prescription medicines for pain, discomfort, or fever as directed by your caregiver.  Rest the joint as much as possible. When in bed, keep sheets and blankets off painful areas.  Keep the affected joint raised (elevated).  Apply warm or cold treatments to painful joints. Use of warm or cold treatments depends on which works best for you.  Use crutches if the painful joint is in your leg.  Drink enough fluids to keep your urine clear or pale yellow. This helps your body get rid of uric acid. Limit alcohol, sugary drinks, and fructose drinks.  Follow your dietary instructions. Pay careful attention to the amount of protein you eat. Your daily diet should emphasize fruits, vegetables, whole grains, and fat-free or low-fat milk products. Discuss the use of coffee, vitamin C, and cherries with your caregiver or dietitian. These may be helpful in lowering uric acid levels.  Maintain a healthy body weight. SEEK MEDICAL CARE IF:   You develop diarrhea, vomiting, or any side effects from medicines.  You do not feel better in 24 hours, or you are getting worse. SEEK IMMEDIATE MEDICAL CARE IF:   Your joint becomes suddenly more tender, and you have chills or a fever. MAKE SURE YOU:   Understand these instructions.  Will watch your condition.  Will get help right away if you are not doing well or get worse. Document Released: 08/08/2000 Document Revised: 12/26/2013 Document Reviewed: 03/24/2012 [facility] Patient Information 2015 [place], [place]. This information is not intended to replace advice given to you by your health care provider. Make sure you discuss any  questions you have with your health care provider.  Gout is caused by excessive buildup of uric acid in the blood stream which then can settle out in the joints causing a very painful arthritis.  Dietary factors can increase or decrease your risk of gout.  I recommend that you decrease your intake the of the following:   Organ meats such as liver and kidneys.  Red meats like beef, pork, lamb, bacon, venison, and [place].  Certain fish such as anchovies, sardines, herring, mussels, cod, scallops, trout, and haddock.   Beer and liquor.  Any foods containing high fructose corn syrup.   The following may be protective against gout:   Dairy products.  Small amounts of wine.  Coffee.  Vitamin C.  [place]

## 2015-07-15 ENCOUNTER — Emergency Department (HOSPITAL_COMMUNITY)
Admission: EM | Admit: 2015-07-15 | Discharge: 2015-07-15 | Disposition: A | Discharge status: Home / Self Care | Payer: Medicare Other | Attending: Emergency Medicine | Admitting: Emergency Medicine

## 2015-07-15 ENCOUNTER — Encounter (HOSPITAL_COMMUNITY): Payer: Self-pay | Admitting: Emergency Medicine

## 2015-07-15 DIAGNOSIS — I10 Essential (primary) hypertension: Secondary | ICD-10-CM | POA: Insufficient documentation

## 2015-07-15 DIAGNOSIS — Z8669 Personal history of other diseases of the nervous system and sense organs: Secondary | ICD-10-CM | POA: Diagnosis not present

## 2015-07-15 DIAGNOSIS — Z872 Personal history of diseases of the skin and subcutaneous tissue: Secondary | ICD-10-CM | POA: Insufficient documentation

## 2015-07-15 DIAGNOSIS — M25562 Pain in left knee: Secondary | ICD-10-CM | POA: Diagnosis not present

## 2015-07-15 DIAGNOSIS — M25561 Pain in right knee: Secondary | ICD-10-CM | POA: Diagnosis not present

## 2015-07-15 DIAGNOSIS — Z792 Long term (current) use of antibiotics: Secondary | ICD-10-CM | POA: Diagnosis not present

## 2015-07-15 DIAGNOSIS — M10062 Idiopathic gout, left knee: Secondary | ICD-10-CM | POA: Diagnosis not present

## 2015-07-15 DIAGNOSIS — M10061 Idiopathic gout, right knee: Secondary | ICD-10-CM | POA: Insufficient documentation

## 2015-07-15 DIAGNOSIS — M109 Gout, unspecified: Secondary | ICD-10-CM

## 2015-07-15 MED ORDER — PREDNISONE 20 MG PO TABS
40.0000 mg | ORAL_TABLET | Freq: Once | ORAL | Status: AC
  Administered 2015-07-15: 40 mg via ORAL
  Filled 2015-07-15: qty 2

## 2015-07-15 MED ORDER — INDOMETHACIN 25 MG PO CAPS
50.0000 mg | ORAL_CAPSULE | Freq: Once | ORAL | Status: AC
  Administered 2015-07-15: 50 mg via ORAL
  Filled 2015-07-15: qty 2

## 2015-07-15 MED ORDER — INDOMETHACIN 50 MG PO CAPS: 50.0000 mg | ORAL_CAPSULE | Freq: Three times a day (TID) | ORAL | Status: DC

## 2015-07-15 MED ORDER — PREDNISONE 10 MG (21) PO TBPK: 40.0000 mg | ORAL_TABLET | Freq: Every day | ORAL | Status: DC

## 2015-07-15 NOTE — ED Provider Notes (Signed)
CSN: [id number]     Arrival date & time 07/15/15  1003 History   First MD Initiated Contact with Patient 07/15/15 1015     Chief Complaint  Patient presents with  . Knee Pain    (Consider location/radiation/quality/duration/timing/severity/associated sxs/prior Treatment) The history is provided by the patient. No language interpreter was used.  Mr. Eric Schultz is a 74 y.o male with a history of hypertension, cellulitis, gout, and sleep apnea who presents with bilateral knee pain with standing that has been worsening for the past couple of days. He states this is similar to his gout pain that he has had in the past. He was given indomethacin in the past which worked well for him but has not been on the medication for 3 months. No treatment prior to arrival.  Denies fever, chills, fall or injury.  Past Medical History  Diagnosis Date  . Cellulitis     left lower leg  . Hypertension     "borderline"  . Gout   . Sleep apnea    History reviewed. No pertinent past surgical history. No family history on file. Social History  Substance Use Topics  . Smoking status: Never Smoker   . Smokeless tobacco: None  . Alcohol Use: No    Review of Systems  Constitutional: Negative for fever and chills.  Musculoskeletal: Positive for arthralgias.  Skin: Negative for wound.  Neurological: Negative for weakness.  All other systems reviewed and are negative.     Allergies  Sulfa antibiotics  Home Medications   Prior to Admission medications   Medication Sig Start Date End Date Taking? Authorizing Provider  aspirin 325 MG tablet Take 650 mg by mouth every 4 (four) hours as needed for mild pain.    Historical Provider, MD  clindamycin (CLEOCIN) 300 MG capsule Take 1 capsule (300 mg total) by mouth 3 (three) times daily. 02/12/14   [name], MD  HYDROcodone-acetaminophen (NORCO/VICODIN) 5-325 MG per tablet 1 to 2 tabs every 4 to 6 hours as needed for pain. 08/07/14   Harden Mo, MD   indomethacin (INDOCIN) 50 MG capsule Take 1 capsule (50 mg total) by mouth 3 (three) times daily with meals. 07/15/15   Heran Campau Patel-Mills, PA-C  predniSONE (STERAPRED UNI-PAK 21 TAB) 10 MG (21) TBPK tablet Take 4 tablets (40 mg total) by mouth daily. 40 mg/day x 4 days 07/15/15   Migel Hannis Patel-Mills, PA-C   BP 126/91 mmHg  Pulse 72  Temp(Src) 98.6 F (37 C) (Oral)  Resp 16  SpO2 93% Physical Exam  Constitutional: He is oriented to person, place, and time. He appears well-developed and well-nourished.  HENT:  Head: Normocephalic and atraumatic.  Eyes: Conjunctivae are normal.  Neck: Normal range of motion. Neck supple.  Cardiovascular: Normal rate.   Pulmonary/Chest: Effort normal. No respiratory distress.  Abdominal: Soft. There is no tenderness.  Musculoskeletal:  Pain with standing and flexion/extension of bilateral knees. No joint effusion or erythema. No warmth. No ecchymosis or rash. Tenderness along the medial aspect of the anterior bilateral knees. No popliteal fossa or calf tenderness. No tibial tuberosity or fibular head pain. No patellar tenderness.   Neurological: He is alert and oriented to person, place, and time.  Skin: Skin is warm and dry.  Nursing note and vitals reviewed.   ED Course  Procedures (including critical care time) Labs Review Labs Reviewed - No data to display  Imaging Review No results found.    EKG Interpretation None      MDM  Final diagnoses:  Acute gout of right knee, unspecified cause  Acute gout of left knee, unspecified cause  Patient presents for bilateral knee pain. Has a history of gout. No concerns for septic joint or reactive arthritis. Patient is afebrile. He has been taking indomethacin but stopped taking it 3 months ago. No injury or fall. No joint effusion. This is most likely his pain from gout since it came on 2 days ago and has since worsened. Pain with ambulation and flexion.  I discussed the importance of following up  and taking his medications. I discussed return precautions and he verbally agrees with the plan.     Ottie Glazier, PA-C 07/15/15 1617  Debby Freiberg, MD 07/16/15 1504

## 2015-07-15 NOTE — Discharge Instructions (Signed)
Gout Follow-up with a primary care provider using the resource guide below. Return for fever, redness or swelling of the joint. Gout is an inflammatory arthritis caused by a buildup of uric acid crystals in the joints. Uric acid is a chemical that is normally present in the blood. When the level of uric acid in the blood is too high it can form crystals that deposit in your joints and tissues. This causes joint redness, soreness, and swelling (inflammation). Repeat attacks are common. Over time, uric acid crystals can form into masses (tophi) near a joint, destroying bone and causing disfigurement. Gout is treatable and often preventable. CAUSES  The disease begins with elevated levels of uric acid in the blood. Uric acid is produced by your body when it breaks down a naturally found substance called purines. Certain foods you eat, such as meats and fish, contain high amounts of purines. Causes of an elevated uric acid level include:  Being passed down from parent to child (heredity).  Diseases that cause increased uric acid production (such as obesity, psoriasis, and certain cancers).  Excessive alcohol use.  Diet, especially diets rich in meat and seafood.  Medicines, including certain cancer-fighting medicines (chemotherapy), water pills (diuretics), and aspirin.  Chronic kidney disease. The kidneys are no longer able to remove uric acid well.  Problems with metabolism. Conditions strongly associated with gout include:  Obesity.  High blood pressure.  High cholesterol.  Diabetes. Not everyone with elevated uric acid levels gets gout. It is not understood why some people get gout and others do not. Surgery, joint injury, and eating too much of certain foods are some of the factors that can lead to gout attacks. SYMPTOMS   An attack of gout comes on quickly. It causes intense pain with redness, swelling, and warmth in a joint.  Fever can occur.  Often, only one joint is involved.  Certain joints are more commonly involved:  Base of the big toe.  Knee.  Ankle.  Wrist.  Finger. Without treatment, an attack usually goes away in a few days to weeks. Between attacks, you usually will not have symptoms, which is different from many other forms of arthritis. DIAGNOSIS  Your caregiver will suspect gout based on your symptoms and exam. In some cases, tests may be recommended. The tests may include:  Blood tests.  Urine tests.  X-rays.  Joint fluid exam. This exam requires a needle to remove fluid from the joint (arthrocentesis). Using a microscope, gout is confirmed when uric acid crystals are seen in the joint fluid. TREATMENT  There are two phases to gout treatment: treating the sudden onset (acute) attack and preventing attacks (prophylaxis).  Treatment of an Acute Attack.  Medicines are used. These include anti-inflammatory medicines or steroid medicines.  An injection of steroid medicine into the affected joint is sometimes necessary.  The painful joint is rested. Movement can worsen the arthritis.  You may use warm or cold treatments on painful joints, depending which works best for you.  Treatment to Prevent Attacks.  If you suffer from frequent gout attacks, your caregiver may advise preventive medicine. These medicines are started after the acute attack subsides. These medicines either help your kidneys eliminate uric acid from your body or decrease your uric acid production. You may need to stay on these medicines for a very long time.  The early phase of treatment with preventive medicine can be associated with an increase in acute gout attacks. For this reason, during the first few months  of treatment, your caregiver may also advise you to take medicines usually used for acute gout treatment. Be sure you understand your caregiver's directions. Your caregiver may make several adjustments to your medicine dose before these medicines are  effective.  Discuss dietary treatment with your caregiver or dietitian. Alcohol and drinks high in sugar and fructose and foods such as meat, poultry, and seafood can increase uric acid levels. Your caregiver or dietitian can advise you on drinks and foods that should be limited. HOME CARE INSTRUCTIONS   Do not take aspirin to relieve pain. This raises uric acid levels.  Only take over-the-counter or prescription medicines for pain, discomfort, or fever as directed by your caregiver.  Rest the joint as much as possible. When in bed, keep sheets and blankets off painful areas.  Keep the affected joint raised (elevated).  Apply warm or cold treatments to painful joints. Use of warm or cold treatments depends on which works best for you.  Use crutches if the painful joint is in your leg.  Drink enough fluids to keep your urine clear or pale yellow. This helps your body get rid of uric acid. Limit alcohol, sugary drinks, and fructose drinks.  Follow your dietary instructions. Pay careful attention to the amount of protein you eat. Your daily diet should emphasize fruits, vegetables, whole grains, and fat-free or low-fat milk products. Discuss the use of coffee, vitamin C, and cherries with your caregiver or dietitian. These may be helpful in lowering uric acid levels.  Maintain a healthy body weight. SEEK MEDICAL CARE IF:   You develop diarrhea, vomiting, or any side effects from medicines.  You do not feel better in 24 hours, or you are getting worse. SEEK IMMEDIATE MEDICAL CARE IF:   Your joint becomes suddenly more tender, and you have chills or a fever. MAKE SURE YOU:   Understand these instructions.  Will watch your condition.  Will get help right away if you are not doing well or get worse.   This information is not intended to replace advice given to you by your health care provider. Make sure you discuss any questions you have with your health care provider.   Document  Released: 08/08/2000 Document Revised: 09/01/2014 Document Reviewed: 03/24/2012 Elsevier Interactive Patient Education 2016 [employer].  Emergency Department Resource Guide 1) Find a Doctor and Pay Out of Pocket Although you won't have to find out who is covered by your insurance plan, it is a good idea to ask around and get recommendations. You will then need to call the office and see if the doctor you have chosen will accept you as a new patient and what types of options they offer for patients who are self-pay. Some doctors offer discounts or will set up payment plans for their patients who do not have insurance, but you will need to ask so you aren't surprised when you get to your appointment.  2) Contact Your Local Health Department Not all health departments have doctors that can see patients for sick visits, but many do, so it is worth a call to see if yours does. If you don't know where your local health department is, you can check in your phone book. The CDC also has a tool to help you locate your state's health department, and many state websites also have listings of all of their local health departments.  3) Find a [facility] [place] [facility] If your illness is not likely to be very severe or complicated, you may want  to try a walk in clinic. These are popping up all over the country in pharmacies, drugstores, and shopping centers. They're usually staffed by nurse practitioners or physician assistants that have been trained to treat common illnesses and complaints. They're usually fairly quick and inexpensive. However, if you have serious medical issues or chronic medical problems, these are probably not your best option.  No Primary Care Doctor: - Call Health Connect at  [phone] - they can help you locate a primary care doctor that  accepts your insurance, provides certain services, etc. - Physician Referral Service- [phone]  Chronic Pain Problems: Organization         Address  Phone    Notes  [facility]  [phone] Patients need to be referred by their primary care doctor.   Medication Assistance: Organization         Address  Phone   Notes  [facility] Medication [facility] [address] [place], [place] [address] [phone] --Must be a resident of [facility] -- Must have NO insurance coverage whatsoever (no Medicaid/ Medicare, etc.) -- The pt. MUST have a primary care doctor that directs their care regularly and follows them in the community   Med[facility]ist  [phone]   [employer]  [phone]    Agencies that provide inexpensive medical care: Organization         Address  Phone   Notes  [place]  [phone]   [facility]    [phone]   [facility] [place], [place] [address] [phone]   [place] [address], [place] [phone]   Planned Parenthood    [phone]   [facility]    [phone]   [place] and [place] [address], [place] Phone:  [phone], Fax:  [phone] Hours of Operation:  9 am - 6 pm, M-F.  Also accepts Medicaid/Medicare and self-pay.  [facility] for [place], [address], [place] Phone: [phone], Fax: [phone]. Hours of Operation:  8:30 am - 5:30 pm, M-F.  Also accepts Medicaid and self-pay.  [facility] [place] [address], [place] Phone: [phone]   [place], [place], [place] [phone], Ext. [phone] Mondays & Thursdays: 7-9 AM.  First 15 patients are seen on a first come, first serve basis.    [place] Providers:  Organization         Address  Phone   Notes  [facility] [address], [place] [phone] Also accepts self-pay patients.  [name]  [id number] [place], [place]  [phone]   [place], [address], [place] [phone]   [facility] [address] [place] [phone]   Lucianne Lei [address], [place]   [phone] Only accepts [place] Access [place] patients after they have their name applied to their card.   Self-Pay (no insurance) in [facility]:  Organization         Address  Phone   Notes  Sickle Cell Patients, [facility] [place] [phone]   [facility] [place] [phone]  [facility] Urgent Care [place]  [place], [address], [place] [phone]   [facility]/Dr. Osei-Bonsu  [address], [place] or [address], [place] [phone] Phone number for both [place] and [place] locations is the same.  [facility]gent Medical and [facility] [address] [place] [phone]   [facility] [address] [place] or [address] [phone]   [facility] [address] [place] [phone], phone; [phone], fax Sees patients 1st and 3rd Saturday of every month.  Must not qualify for public or private insurance (i.e. Medicaid, Medicare, [place] Health Choice, Veterans' Benefits)  Household income should be no more than 200% of the poverty level The clinic cannot treat you if you are pregnant or think you are pregnant  Sexually transmitted diseases are not treated at the clinic.    Dental Care: Organization         Address  Phone  Notes  [facility] of [facility] [place] [phone] Accepts children up to age 21 who are enrolled in [place] or [place]; pregnant women with a Medicaid card; and children who have  applied for Medicaid or [place] Health Choice, but were declined, whose parents can pay a reduced fee at time of service.  [facility] Department of [facility]  [address], [place] [phone] Accepts children up to age 40 who are enrolled in [place] or [place]; pregnant women with a Medicaid card; and children who have applied for Medicaid or [place] Health Choice, but were declined, whose parents can pay a reduced fee at time of service.  [place] Adult Dental Access PROGRAM  [place] [phone] Patients are seen by appointment only. Walk-ins are not accepted. [place] will see patients 66 years of age and older. Monday - Tuesday (8am-5pm) Most Wednesdays (8:30-5pm) $30 per visit, cash only  [facility] Adult Dental Access PROGRAM  [address], [facility] [phone] Patients are seen by appointment only. Walk-ins are not accepted. [place] will see patients 43 years of age and older. One Wednesday Evening (Monthly: Volunteer Based).  $30 per visit, cash only  [place]  [phone] for adults; Children under age 38, call Graduate Pediatric Dentistry at [phone]. Children aged 31-14, please call [phone] to request a pediatric application.  Dental services are provided in all areas of dental care including fillings, crowns and bridges, complete and partial dentures, implants, gum treatment, root canals, and extractions. Preventive care is also provided. Treatment is provided to both adults and children. Patients are selected via a lottery and there is often a waiting list.   [facility] [address] [place]  [phone] [email or web address]   [facility] [address] [place], [place] [phone], Ext. [phone] Second and Fourth Thursday of each month, opens at 6:30 AM; Clinic ends at 9 AM.  Patients are seen on a first-come first-served basis, and a  limited number are seen during each clinic.   [facility]  [address] Hillard Danker [place], [place] [phone]   Eligibility Requirements You must have lived in [place], [place], or [place] counties for at least the last three months.   You cannot be eligible for state or federal sponsored  healthcare insurance, including [employer], [place], or [employer].   You generally cannot be eligible for healthcare insurance through your employer.    How to apply: Eligibility screenings are held every Tuesday and Wednesday afternoon from 1:00 pm until 4:00 pm. You do not need an appointment for the interview!  [facility] [address] [place], [place]   [place]  [place] Department  [place]  [phone]    Behavioral Health Resources in the Community: Intensive Outpatient Programs Organization         Address  Phone  Notes  [place]. [address] [place], [place] [phone]   [facility] [address], [place], [place]   ADS: Alcohol & Drug Svcs [address], [place], [place]   [place] [address]  [place], [place] or [phone]   Substance Abuse Resources Organization         Address  Phone  Notes  Alcohol and Drug Services  [phone]   [place]  [phone]   [facility] [place]   Chinita Pester  [phone]   Residential & Outpatient Substance Abuse Program  [phone]   Psychological Services Organization         Address  Phone  Notes  [facility] [place]  [place]  [phone]   [place] [address] [place] or [phone]    Mobile Crisis Teams Organization          Address  Phone  Notes  [facility], Mobile Crisis Care Unit  [phone]   [facility]  [address] [place], [place]   [place] Levels [address], [place] [phone]    Self-Help/Support Groups Organization         Address  Phone             Notes  [place]. of [place] - variety of support groups  [place] Call for more information  Narcotics Anonymous (NA), [facility] [address], [employer] [place]  2 meetings at this location   Special educational needs teacher         Address  Phone  Notes  [facility]AP Residential Treatment [place],    [place]  [phone]   [facility]  [address], [place] [id number], [place], [place]   [place], [place] [phone] Admissions: 8am-3pm M-F  Incentives Substance [place] [address]    [place], [place] [phone]   [facility] [address] [place], [place], [place]   [facility] [address]  [place], [place]   [facility] Programs - Intensive Outpatient [address]een [address], [place], [place]   [facility] ([place] [address].,  [place], [place] [phone] or [phone]   [facility] Services (RTS) [address] [place], [place] Accepts Medicaid  [facility] [place] [address],  [place] [phone] Substance Abuse/Addiction Treatment   [facility] Organization         Address  Phone  Notes  [facility]  [phone]   Domenic Schwab, PhD [address] [place], [place]   [phone] or (  [place]) [phone]   [facility]   [address] [place], [place] [phone]   [address], [place], [place] [phone] Insurance/Medicaid/sponsorship  through [facility] and Families [address] [place], [place] [phone] [place] [address]   [place], [place] [phone]    Dr. Adele Schilder  [phone]   [facility] Dept. 1) [address] [place] 2) [place] 3)  [place] [address], [place] [phone]  [phone]   [place] [phone] or [phone] (After Hours)

## 2015-07-15 NOTE — ED Notes (Signed)
Patient comes in today for complains of bilateral knee pain. Patient has Hx of gout Patient refuses to take off pants for assessment. States he knows its his gout and he has not been on medication for 3 months.

## 2015-07-20 ENCOUNTER — Emergency Department (INDEPENDENT_AMBULATORY_CARE_PROVIDER_SITE_OTHER)
Admission: EM | Admit: 2015-07-20 | Discharge: 2015-07-20 | Disposition: A | Discharge status: Home / Self Care | Payer: Medicare Other | Source: Home / Self Care

## 2015-07-20 ENCOUNTER — Encounter (HOSPITAL_COMMUNITY): Payer: Self-pay | Admitting: Emergency Medicine

## 2015-07-20 ENCOUNTER — Emergency Department (INDEPENDENT_AMBULATORY_CARE_PROVIDER_SITE_OTHER): Payer: Medicare Other

## 2015-07-20 DIAGNOSIS — J069 Acute upper respiratory infection, unspecified: Secondary | ICD-10-CM | POA: Diagnosis not present

## 2015-07-20 DIAGNOSIS — R05 Cough: Secondary | ICD-10-CM | POA: Diagnosis not present

## 2015-07-20 MED ORDER — AMOXICILLIN 500 MG PO CAPS: 500.0000 mg | ORAL_CAPSULE | Freq: Three times a day (TID) | ORAL | Status: DC

## 2015-07-20 NOTE — ED Notes (Signed)
The patient presented to the [facility] with a complaint of post nasal drip, congestion and  Cough that has been ongoing for 3 weeks.

## 2015-07-20 NOTE — Discharge Instructions (Signed)

## 2016-12-04 ENCOUNTER — Encounter (HOSPITAL_COMMUNITY): Payer: Self-pay | Admitting: Emergency Medicine

## 2016-12-04 ENCOUNTER — Ambulatory Visit (HOSPITAL_COMMUNITY)
Admission: EM | Admit: 2016-12-04 | Discharge: 2016-12-04 | Disposition: A | Discharge status: Home / Self Care | Payer: Medicare Other | Attending: Family Medicine | Admitting: Family Medicine

## 2016-12-04 DIAGNOSIS — L03116 Cellulitis of left lower limb: Secondary | ICD-10-CM | POA: Diagnosis not present

## 2016-12-04 MED ORDER — CLINDAMYCIN HCL 300 MG PO CAPS: 300.0000 mg | ORAL_CAPSULE | Freq: Three times a day (TID) | ORAL | 0 refills | Status: AC

## 2016-12-04 MED ORDER — HYDROCODONE-ACETAMINOPHEN 5-325 MG PO TABS: 1.0000 | ORAL_TABLET | Freq: Four times a day (QID) | ORAL | 0 refills | Status: DC | PRN

## 2016-12-04 NOTE — Discharge Instructions (Signed)
I am treating you tonight for cellulitis. I have prescribed Clindamycin. Take 1 tablet three times a day. I have also prescribed a medicine for pain called hydrocodone, this medicine is a narcotic, it will cause drowsiness, and it is addictive. Do not take more than what is necessary, do not drink alcohol while taking, and do not operate any heavy machinery while taking this medicine.   Should your pain and swelling persist, or fail to resolve, follow up with primary care provider or return to clinic. I also recommend you follow-up with a primary care provider for management of your chronic conditions as well as for full physical.

## 2016-12-04 NOTE — ED Triage Notes (Signed)
Here for chronic edema to LLE... sts he has been seen in the past for similar sx   Hx of cellulitis and borderline HTN and Gout  Brought back on wheel chair  Sx today also include pain  Denies SOB, dyspnea, CP  A&O x4... NAD

## 2016-12-04 NOTE — ED Provider Notes (Signed)
CSN: [id number]     Arrival date & time 12/04/16  1754 History   First MD Initiated Contact with Patient 12/04/16 1851     Chief Complaint  Patient presents with  . Leg Swelling   (Consider location/radiation/quality/duration/timing/severity/associated sxs/prior Treatment) 76 year old male presents to clinic for evaluation of left leg pain, swelling, edema, and erythema. Has a past history of cellulitis, states this is a chronic problem has been ongoing for several years. He denies any fever, nausea, or other type of malaise, he does say that this particular episode is been ongoing for 4 days, been difficult for him to walk and bear weight due to pain in the leg. He denies any smoking, recent hospitalizations, trauma, long trips, history of clots, or history of cancer. He denies any shortness of breath, wheezing, or exertional dyspnea, he is able to sleep flat at night with one pillow.   The history is provided by the patient.    Past Medical History:  Diagnosis Date  . Cellulitis    left lower leg  . Gout   . Hypertension    "borderline"  . Sleep apnea    History reviewed. No pertinent surgical history. History reviewed. No pertinent family history. Social History  Substance Use Topics  . Smoking status: Never Smoker  . Smokeless tobacco: Never Used  . Alcohol use No    Review of Systems  Constitutional: Negative for chills, fatigue and fever.  HENT: Negative.   Respiratory: Negative for cough, choking, chest tightness, shortness of breath and wheezing.   Cardiovascular: Positive for leg swelling. Negative for chest pain and palpitations.  Gastrointestinal: Negative for diarrhea, nausea and vomiting.  Genitourinary: Negative.   Musculoskeletal: Negative.   Skin: Positive for color change.  Neurological: Negative.   All other systems reviewed and are negative.   Allergies  Sulfa antibiotics  Home Medications   Prior to Admission medications   Medication Sig Start  Date End Date Taking? Authorizing Provider  aspirin 325 MG tablet Take 650 mg by mouth every 4 (four) hours as needed for mild pain.    Historical Provider, MD  clindamycin (CLEOCIN) 300 MG capsule Take 1 capsule (300 mg total) by mouth 3 (three) times daily. 12/04/16 12/11/16  Barnet Glasgow, NP  HYDROcodone-acetaminophen (NORCO/VICODIN) 5-325 MG tablet Take 1 tablet by mouth every 6 (six) hours as needed. 12/04/16   Barnet Glasgow, NP   Meds Ordered and Administered this Visit  Medications - No data to display  BP 126/79 (BP Location: Left Arm)   Pulse 72   Temp 98.7 F (37.1 C) (Oral)   Resp 20   SpO2 97%  No data found.   Physical Exam  Constitutional: He is oriented to person, place, and time. He appears well-developed and well-nourished. No distress.  HENT:  Head: Normocephalic and atraumatic.  Right Ear: External ear normal.  Left Ear: External ear normal.  Eyes: Conjunctivae are normal. Right eye exhibits no discharge. Left eye exhibits no discharge.  Cardiovascular: Normal rate and regular rhythm.   Pulmonary/Chest: Effort normal and breath sounds normal.  Musculoskeletal: He exhibits edema and tenderness.  Left leg edema and erythema distal to the knee. +2 pitting edema, pulses intact distally, no calf pain.  Neurological: He is alert and oriented to person, place, and time.  Skin: Skin is warm. Capillary refill takes less than 2 seconds. He is not diaphoretic.  Psychiatric: He has a normal mood and affect. His behavior is normal.  Nursing note and vitals reviewed.  Urgent Care Course     Procedures (including critical care time)  Labs Review Labs Reviewed - No data to display  Imaging Review No results found.   MDM   1. Cellulitis of left lower extremity    Index of suspicion for DVT, or other type of clot is low, patient signs and symptoms are consistent with prior history of cellulitis. His last treated with clindamycin with success, I am doing this as  well today. For pain given hydrocodone, now also encouraged following up with a primary care provider for further evaluation, and management of his care.    Barnet Glasgow, NP 12/04/16 1914

## 2016-12-14 ENCOUNTER — Ambulatory Visit (HOSPITAL_COMMUNITY)
Admission: EM | Admit: 2016-12-14 | Discharge: 2016-12-14 | Disposition: A | Discharge status: Home / Self Care | Payer: Medicare Other | Attending: Family Medicine | Admitting: Family Medicine

## 2016-12-14 ENCOUNTER — Encounter (HOSPITAL_COMMUNITY): Payer: Self-pay | Admitting: Family Medicine

## 2016-12-14 DIAGNOSIS — L03116 Cellulitis of left lower limb: Secondary | ICD-10-CM | POA: Diagnosis not present

## 2016-12-14 MED ORDER — CEFTRIAXONE SODIUM 1 G IJ SOLR
1.0000 g | Freq: Once | INTRAMUSCULAR | Status: AC
  Administered 2016-12-14: 1 g via INTRAMUSCULAR

## 2016-12-14 MED ORDER — CEFTRIAXONE SODIUM 1 G IJ SOLR
INTRAMUSCULAR | Status: AC
  Filled 2016-12-14: qty 10

## 2016-12-14 MED ORDER — HYDROCODONE-ACETAMINOPHEN 5-325 MG PO TABS: 1.0000 | ORAL_TABLET | Freq: Four times a day (QID) | ORAL | 0 refills | Status: DC | PRN

## 2016-12-14 MED ORDER — DOXYCYCLINE HYCLATE 100 MG PO TABS: 100.0000 mg | ORAL_TABLET | Freq: Two times a day (BID) | ORAL | 0 refills | Status: DC

## 2016-12-14 NOTE — ED Triage Notes (Signed)
Patient reports left lower leg swelling, redness, and pain x 3 weeks. Denies decreased CMS but states that moving his toes is hard because of the swelling. Swelling goes past knee into left thigh.

## 2016-12-14 NOTE — ED Provider Notes (Signed)
[facility]    CSN: [id number] Arrival date & time: 12/14/16  1340     History   Chief Complaint No chief complaint on file.   HPI Eric Schultz is a 76 y.o. male.   There is a 76 year old man who presents with poor hygiene and chronic left leg cellulitis. He states that he needs pain medicine.  Patient was seen 2 weeks ago. He was prescribed clindamycin which she says he took but there was no improvement.      Past Medical History:  Diagnosis Date  . Cellulitis    left lower leg  . Gout   . Hypertension    "borderline"  . Sleep apnea     Patient Active Problem List   Diagnosis Date Noted  . Fever 02/09/2014  . Sepsis ([place]) 02/09/2014  . Hypokalemia 02/09/2014  . Thrombocytopenia ([place]) 02/09/2014    History reviewed. No pertinent surgical history.     Home Medications    Prior to Admission medications   Medication Sig Start Date End Date Taking? Authorizing Provider  aspirin 325 MG tablet Take 650 mg by mouth every 4 (four) hours as needed for mild pain.    Historical Provider, MD  doxycycline (VIBRA-TABS) 100 MG tablet Take 1 tablet (100 mg total) by mouth 2 (two) times daily. 12/14/16   Robyn Haber, MD  HYDROcodone-acetaminophen (NORCO/VICODIN) 5-325 MG tablet Take 1 tablet by mouth every 6 (six) hours as needed. 12/14/16   Robyn Haber, MD    Family History No family history on file.  Social History Social History  Substance Use Topics  . Smoking status: Never Smoker  . Smokeless tobacco: Never Used  . Alcohol use No     Allergies   Sulfa antibiotics   Review of Systems Review of Systems  Constitutional: Negative.   HENT: Negative.   Respiratory: Negative.   Cardiovascular: Negative.   Gastrointestinal: Negative.   Musculoskeletal: Positive for gait problem.  Skin: Positive for rash.     Physical Exam Triage Vital Signs ED Triage Vitals [12/14/16 1430]  Enc Vitals Group     BP 137/83     Pulse Rate 68   Resp 18     Temp 97.4 F (36.3 C)     Temp Source Oral     SpO2 97 %     Weight      Height      Head Circumference      Peak Flow      Pain Score      Pain Loc      Pain Edu?      Excl. in [place]?    No data found.   Updated Vital Signs BP 137/83 (BP Location: Right Arm)   Pulse 68   Temp 97.4 F (36.3 C) (Oral)   Resp 18   SpO2 97%    Physical Exam  Constitutional: He is oriented to person, place, and time. He appears well-developed and well-nourished.  HENT:  Right Ear: External ear normal.  Left Ear: External ear normal.  Eyes: Conjunctivae and EOM are normal. Pupils are equal, round, and reactive to light.  Neck: Normal range of motion. Neck supple.  Pulmonary/Chest: Effort normal.  Musculoskeletal: He exhibits tenderness. He exhibits no deformity.  Left lower extremity is diffusely and circumferentially reddened with induration, no fluctuation, and no localized pain or tenderness.  Neurological: He is alert and oriented to person, place, and time.  Skin: Skin is warm and dry. Rash noted.  Nursing note and vitals reviewed.    UC Treatments / Results  Labs (all labs ordered are listed, but only abnormal results are displayed) Labs Reviewed - No data to display  EKG  EKG Interpretation None       Radiology No results found.  Procedures Procedures (including critical care time)  Medications Ordered in UC Medications  cefTRIAXone (ROCEPHIN) injection 1 g (not administered)     Initial Impression / Assessment and Plan / UC Course  I have reviewed the triage vital signs and the nursing notes.  Pertinent labs & imaging results that were available during my care of the patient were reviewed by me and considered in my medical decision making (see chart for details).     Final Clinical Impressions(s) / UC Diagnoses   Final diagnoses:  Cellulitis of left lower extremity    New Prescriptions New Prescriptions   DOXYCYCLINE (VIBRA-TABS) 100 MG  TABLET    Take 1 tablet (100 mg total) by mouth 2 (two) times daily.     Robyn Haber, MD 12/14/16 [phone]

## 2016-12-14 NOTE — ED Notes (Signed)
Teach back method used for follow up with PCP, antibiotics, and pain medication.

## 2016-12-14 NOTE — Discharge Instructions (Signed)
Please make an appointment with the doctor noted below for follow-up.

## 2016-12-18 ENCOUNTER — Ambulatory Visit (HOSPITAL_COMMUNITY)
Admission: EM | Admit: 2016-12-18 | Discharge: 2016-12-18 | Disposition: A | Discharge status: Home / Self Care | Payer: Medicare Other | Attending: Internal Medicine | Admitting: Internal Medicine

## 2016-12-18 ENCOUNTER — Encounter (HOSPITAL_COMMUNITY): Payer: Self-pay | Admitting: Emergency Medicine

## 2016-12-18 DIAGNOSIS — M79605 Pain in left leg: Secondary | ICD-10-CM

## 2016-12-18 DIAGNOSIS — I872 Venous insufficiency (chronic) (peripheral): Secondary | ICD-10-CM | POA: Diagnosis not present

## 2016-12-18 MED ORDER — OXYCODONE-ACETAMINOPHEN 5-325 MG PO TABS: 1.0000 | ORAL_TABLET | Freq: Four times a day (QID) | ORAL | 0 refills | Status: DC | PRN

## 2016-12-18 NOTE — Discharge Instructions (Signed)
Keep her leg elevated. Continue your medications. Your pain medication has been changed to the strongest medicines that we can prescribe. It is unlikely that we will be able to continue to prescribe additional pain medications. We will have to be prescribed by your primary care provider or specialist.

## 2016-12-18 NOTE — ED Triage Notes (Signed)
PT reports he is here for pain medicine. PT states, "I need something stronger than this hydrocodone I've been taking." PT has been on hydrocodone for 2-3 weeks

## 2016-12-18 NOTE — ED Provider Notes (Signed)
CSN: [id number]     Arrival date & time 12/18/16  1402 History   First MD Initiated Contact with Patient 12/18/16 1420     Chief Complaint  Patient presents with  . Leg Pain   (Consider location/radiation/quality/duration/timing/severity/associated sxs/prior Treatment) 76 year old male with a history of chronic left leg swelling and pain. He has been seen now the fourth time this month by ER and urgent care for pain in the left lower extremity. There is swelling, erythema and discoloration somewhat similar to venous stasis changes. He has been taking hydrocodone prescribed by the ER and urgent care for the past couple of weeks. He states there is been no change in his leg status except for the pain is increasing and he is out of his hydrocodone. He is requesting something stronger. He also states that he has an appointment with a PCP in 5 days.      Past Medical History:  Diagnosis Date  . Cellulitis    left lower leg  . Gout   . Hypertension    "borderline"  . Sleep apnea    History reviewed. No pertinent surgical history. No family history on file. Social History  Substance Use Topics  . Smoking status: Never Smoker  . Smokeless tobacco: Never Used  . Alcohol use No    Review of Systems  Constitutional: Positive for activity change.  Respiratory: Negative.   Musculoskeletal:       As per history of present illness.  Skin: Positive for color change. Negative for wound.  Neurological: Negative.   All other systems reviewed and are negative.   Allergies  Sulfa antibiotics  Home Medications   Prior to Admission medications   Medication Sig Start Date End Date Taking? Authorizing Provider  aspirin 325 MG tablet Take 650 mg by mouth every 4 (four) hours as needed for mild pain.    Historical Provider, MD  doxycycline (VIBRA-TABS) 100 MG tablet Take 1 tablet (100 mg total) by mouth 2 (two) times daily. 12/14/16   Robyn Haber, MD  oxyCODONE-acetaminophen  (PERCOCET/ROXICET) 5-325 MG tablet Take 1-2 tablets by mouth every 6 (six) hours as needed for severe pain. 12/18/16   Janne Napoleon, NP   Meds Ordered and Administered this Visit  Medications - No data to display  BP (!) 147/84 (BP Location: Left Arm) Comment: rn notified  Pulse 63 Comment:    Temp 98.6 F (37 C) (Oral)   Resp 16   Ht 5\' 11"  (1.803 m)   Wt 265 lb (120.2 kg)   SpO2 100%   BMI 36.96 kg/m  No data found.   Physical Exam  Constitutional: He is oriented to person, place, and time. He appears well-developed and well-nourished. No distress.  Neck: Neck supple.  Pulmonary/Chest: Effort normal.  Musculoskeletal: He exhibits edema and tenderness.  Left lower extremity with 3+ pitting edema, erythema, rough skin some purplish discoloration and tenderness. The patient states that it the appearance has not changed since the last time he was evaluated here. There are no open lesions, drainage or purulence. Unable to palpate total pulse secondary to edema and tenderness.  Neurological: He is alert and oriented to person, place, and time.  Skin: No rash noted.  Psychiatric: He has a normal mood and affect.  Nursing note and vitals reviewed.   Urgent Care Course     Procedures (including critical care time)  Labs Review Labs Reviewed - No data to display  Imaging Review No results found.   Visual Acuity  Review  Right Eye Distance:   Left Eye Distance:   Bilateral Distance:    Right Eye Near:   Left Eye Near:    Bilateral Near:         MDM   1. Left leg pain   2. Venous stasis dermatitis of left lower extremity    Keep her leg elevated. Continue your medications. Your pain medication has been changed to the strongest medicines that we can prescribe. It is unlikely that we will be able to continue to prescribe additional pain medications. We will have to be prescribed by your primary care provider or specialist. Meds ordered this encounter  Medications  .  oxyCODONE-acetaminophen (PERCOCET/ROXICET) 5-325 MG tablet    Sig: Take 1-2 tablets by mouth every 6 (six) hours as needed for severe pain.    Dispense:  20 tablet    Refill:  0    Order Specific Question:   Supervising Provider    Answer:   Sherlene Shams [[id number]]       Janne Napoleon, NP 12/18/16 [phone]

## 2016-12-24 ENCOUNTER — Encounter: Payer: Self-pay | Admitting: Family Medicine

## 2016-12-24 ENCOUNTER — Ambulatory Visit (INDEPENDENT_AMBULATORY_CARE_PROVIDER_SITE_OTHER): Payer: Medicare Other | Admitting: Family Medicine

## 2016-12-24 VITALS — BP 123/68 | HR 65 | Temp 98.4°F | Resp 18 | Ht 70.0 in | Wt 296.6 lb

## 2016-12-24 DIAGNOSIS — L03116 Cellulitis of left lower limb: Secondary | ICD-10-CM | POA: Diagnosis not present

## 2016-12-24 DIAGNOSIS — I10 Essential (primary) hypertension: Secondary | ICD-10-CM

## 2016-12-24 DIAGNOSIS — I872 Venous insufficiency (chronic) (peripheral): Secondary | ICD-10-CM | POA: Diagnosis not present

## 2016-12-24 MED ORDER — OXYCODONE-ACETAMINOPHEN 5-325 MG PO TABS: 1.0000 | ORAL_TABLET | Freq: Four times a day (QID) | ORAL | 0 refills | Status: DC | PRN

## 2016-12-24 MED ORDER — CEPHALEXIN 500 MG PO CAPS: 500.0000 mg | ORAL_CAPSULE | Freq: Four times a day (QID) | ORAL | 0 refills | Status: DC

## 2016-12-24 MED ORDER — CEFTRIAXONE SODIUM 1 G IJ SOLR
1.0000 g | Freq: Once | INTRAMUSCULAR | Status: AC
  Administered 2016-12-24: 1 g via INTRAMUSCULAR

## 2016-12-24 NOTE — Patient Instructions (Signed)
     IF you received an x-ray today, you will receive an invoice from [facility]. Please contact [facility] at [phone] with questions or concerns regarding your invoice.   IF you received labwork today, you will receive an invoice from [facility]. Please contact [facility] at [phone] with questions or concerns regarding your invoice.   Our billing staff will not be able to assist you with questions regarding bills from these companies.  You will be contacted with the lab results as soon as they are available. The fastest way to get your results is to activate your My Chart account. Instructions are located on the last page of this paperwork. If you have not heard from us regarding the results in 2 weeks, please contact this office.     

## 2016-12-24 NOTE — Progress Notes (Signed)
Subjective:    Patient ID: Eric Schultz, male    DOB: [birth year], 76 y.o.   MRN: [id number]  12/24/2016  Cellulitis (Left lower leg)   HPI This 76 y.o. male presents to establish care and for follow-up of LLE cellulitis.  Has suffered with chronic venous stasis and intermittent cellulitis.  Dr. Joseph Art at [facility] recommended establishing care and for ongoing treatment of cellulitis.  No fever/chills/sweats.  Took antibiotics as prescribed by Dr. Joseph Art yet symptoms continue.  See UC office visit on 12/04/16 with Dr. Joseph Art, 12/14/16 with Dr.Lauenstein, 12/18/16 with Dr. Valere Dross.  No compression stockings.    Last physical:  Not sure; 2-3 years ago.   There is no immunization history on file for this patient. BP Readings from Last 3 Encounters:  12/31/16 (!) 149/83  12/24/16 123/68  12/18/16 (!) 147/84   Wt Readings from Last 3 Encounters:  12/31/16 298 lb 3.2 oz (135.3 kg)  12/24/16 296 lb 9.6 oz (134.5 kg)  12/18/16 265 lb (120.2 kg)    Review of Systems  Constitutional: Negative for activity change, appetite change, chills, diaphoresis, fatigue and fever.  Respiratory: Negative for cough and shortness of breath.   Cardiovascular: Positive for leg swelling. Negative for chest pain and palpitations.  Gastrointestinal: Negative for abdominal pain, diarrhea, nausea and vomiting.  Endocrine: Negative for cold intolerance, heat intolerance, polydipsia, polyphagia and polyuria.  Skin: Positive for color change. Negative for rash and wound.  Neurological: Negative for dizziness, tremors, seizures, syncope, facial asymmetry, speech difficulty, weakness, light-headedness, numbness and headaches.  Psychiatric/Behavioral: Negative for dysphoric mood and sleep disturbance. The patient is not nervous/anxious.     Past Medical History:  Diagnosis Date  . Cellulitis    left lower leg  . Gout   . Hypertension    "borderline"  . Sleep apnea    No past surgical history on  file. Allergies  Allergen Reactions  . Sulfa Antibiotics Nausea And Vomiting    Social History   Social History  . Marital status: Single    Spouse name: N/A  . Number of children: N/A  . Years of education: N/A   Occupational History  . Not on file.   Social History Main Topics  . Smoking status: Never Smoker  . Smokeless tobacco: Never Used  . Alcohol use No  . Drug use: No  . Sexual activity: Not on file   Other Topics Concern  . Not on file   Social History Narrative   Marital status: single      Children: none      Employment: semi-retired;       Tobacco: none      Alcohol: none         No family history on file.     Objective:    BP 123/68   Pulse 65   Temp 98.4 F (36.9 C) (Oral)   Resp 18   Ht 5\' 10"  (1.778 m)   Wt 296 lb 9.6 oz (134.5 kg)   SpO2 95%   BMI 42.56 kg/m  Physical Exam  Constitutional: He is oriented to person, place, and time. He appears well-developed and well-nourished. No distress.  HENT:  Head: Normocephalic and atraumatic.  Right Ear: External ear normal.  Left Ear: External ear normal.  Nose: Nose normal.  Mouth/Throat: Oropharynx is clear and moist.  Eyes: Conjunctivae and EOM are normal. Pupils are equal, round, and reactive to light.  Neck: Normal range of motion. Neck supple. Carotid bruit is  not present. No thyromegaly present.  Cardiovascular: Normal rate, regular rhythm, normal heart sounds and intact distal pulses.  Exam reveals no gallop and no friction rub.   No murmur heard. 2+ pitting and non-pitting edema B lower extremities L>R with hyperpigmentation and hypertrophy of skin diffusely of BLE.  Mild warmth and erythema anterior and posterior L calf; no streaking.    Pulmonary/Chest: Effort normal and breath sounds normal. He has no wheezes. He has no rales.  Musculoskeletal: He exhibits edema.  Lymphadenopathy:    He has no cervical adenopathy.  Neurological: He is alert and oriented to person, place, and time.  No cranial nerve deficit.  Skin: Skin is warm and dry. No rash noted. He is not diaphoretic.  Psychiatric: He has a normal mood and affect. His behavior is normal.  Nursing note and vitals reviewed.  PROCEDURE: UNABOOT PLACED ON LLE.     Assessment & Plan:   1. Cellulitis of leg, left   2. Essential hypertension   3. Venous stasis dermatitis of both lower extremities    -unaboot placement in office; RTC one week for reevaluation. -s/p Ceftriaxone in office. -rx for Keflex and oxycodone. -most consistent with venous stasis moreso than cellulitis however I have no baseline for patient; thus, will treat for both.   Orders Placed This Encounter  Procedures  . CBC with Differential/Platelet  . Comprehensive metabolic panel   Meds ordered this encounter  Medications  . cefTRIAXone (ROCEPHIN) injection 1 g  . DISCONTD: oxyCODONE-acetaminophen (PERCOCET/ROXICET) 5-325 MG tablet    Sig: Take 1-2 tablets by mouth every 6 (six) hours as needed for severe pain.    Dispense:  30 tablet    Refill:  0  . cephALEXin (KEFLEX) 500 MG capsule    Sig: Take 1 capsule (500 mg total) by mouth 4 (four) times daily.    Dispense:  40 capsule    Refill:  0    Return in about 1 week (around 12/31/2016) for recheck L leg swelling.Norwood Levo, M.D. Primary Care at [facility] previously [facility] [place] [address] [place], [place]  [address] [phone] phone [phone] fax

## 2016-12-25 LAB — COMPREHENSIVE METABOLIC PANEL
ALK PHOS: 115 IU/L (ref 39–117)
ALT: 14 IU/L (ref 0–44)
AST: 25 IU/L (ref 0–40)
Albumin/Globulin Ratio: 0.8 — ABNORMAL LOW (ref 1.2–2.2)
Albumin: 3.5 g/dL (ref 3.5–4.8)
BUN/Creatinine Ratio: 12 (ref 10–24)
BUN: 11 mg/dL (ref 8–27)
Bilirubin Total: 0.6 mg/dL (ref 0.0–1.2)
CALCIUM: 8.6 mg/dL (ref 8.6–10.2)
CO2: 28 mmol/L (ref 18–29)
CREATININE: 0.91 mg/dL (ref 0.76–1.27)
Chloride: 102 mmol/L (ref 96–106)
GFR, EST AFRICAN AMERICAN: 95 mL/min/{1.73_m2} (ref >59)
GFR, EST NON AFRICAN AMERICAN: 82 mL/min/{1.73_m2} (ref >59)
GLOBULIN, TOTAL: 4.2 g/dL (ref 1.5–4.5)
GLUCOSE: 88 mg/dL (ref 65–99)
Potassium: 3.9 mmol/L (ref 3.5–5.2)
Sodium: 141 mmol/L (ref 134–144)
TOTAL PROTEIN: 7.7 g/dL (ref 6.0–8.5)

## 2016-12-25 LAB — CBC WITH DIFFERENTIAL/PLATELET
BASOS: 0 %
Basophils Absolute: 0 10*3/uL (ref 0.0–0.2)
EOS (ABSOLUTE): 0.2 10*3/uL (ref 0.0–0.4)
Eos: 3 %
HEMATOCRIT: 38.8 % (ref 37.5–51.0)
Hemoglobin: 13.3 g/dL (ref 13.0–17.7)
IMMATURE GRANS (ABS): 0 10*3/uL (ref 0.0–0.1)
Immature Granulocytes: 1 %
LYMPHS: 29 %
Lymphocytes Absolute: 1.4 10*3/uL (ref 0.7–3.1)
MCH: 33.2 pg — ABNORMAL HIGH (ref 26.6–33.0)
MCHC: 34.3 g/dL (ref 31.5–35.7)
MCV: 97 fL (ref 79–97)
MONOS ABS: 1 10*3/uL — AB (ref 0.1–0.9)
Monocytes: 20 %
NEUTROS PCT: 47 %
Neutrophils Absolute: 2.3 10*3/uL (ref 1.4–7.0)
Platelets: 138 10*3/uL — ABNORMAL LOW (ref 150–379)
RBC: 4.01 x10E6/uL — AB (ref 4.14–5.80)
RDW: 13.8 % (ref 12.3–15.4)
WBC: 4.8 10*3/uL (ref 3.4–10.8)

## 2016-12-31 ENCOUNTER — Encounter: Payer: Self-pay | Admitting: Family Medicine

## 2016-12-31 ENCOUNTER — Ambulatory Visit (INDEPENDENT_AMBULATORY_CARE_PROVIDER_SITE_OTHER): Payer: Medicare Other | Admitting: Family Medicine

## 2016-12-31 VITALS — BP 149/83 | HR 59 | Temp 98.8°F | Resp 16 | Ht 70.0 in | Wt 298.2 lb

## 2016-12-31 DIAGNOSIS — I872 Venous insufficiency (chronic) (peripheral): Secondary | ICD-10-CM

## 2016-12-31 MED ORDER — OXYCODONE-ACETAMINOPHEN 5-325 MG PO TABS: 1.0000 | ORAL_TABLET | Freq: Four times a day (QID) | ORAL | 0 refills | Status: DC | PRN

## 2016-12-31 NOTE — Progress Notes (Signed)
Subjective:    Patient ID: Eric Schultz, male    DOB: [birth year], 76 y.o.   MRN: [id number]  12/31/2016  Follow-up (recheck on leg swelling) and Medication Refill (Oxycodone)   HPI This 76 y.o. male presents for evaluation of venous stasis dermatitis of LEFT lower extremity.  Doing well.  Denies fever/chills/sweats.  Requesting refill of oxycodone.  No concerns.   Review of Systems  Constitutional: Negative for activity change, appetite change, chills, diaphoresis, fatigue and fever.  Respiratory: Negative for cough and shortness of breath.   Cardiovascular: Negative for chest pain, palpitations and leg swelling.  Gastrointestinal: Negative for abdominal pain, diarrhea, nausea and vomiting.  Endocrine: Negative for cold intolerance, heat intolerance, polydipsia, polyphagia and polyuria.  Skin: Positive for color change. Negative for pallor, rash and wound.  Neurological: Negative for dizziness, tremors, seizures, syncope, facial asymmetry, speech difficulty, weakness, light-headedness, numbness and headaches.  Psychiatric/Behavioral: Negative for dysphoric mood and sleep disturbance. The patient is not nervous/anxious.     Past Medical History:  Diagnosis Date  . Cellulitis    left lower leg  . Gout   . Hypertension    "borderline"  . Sleep apnea    No past surgical history on file. Allergies  Allergen Reactions  . Sulfa Antibiotics Nausea And Vomiting    Social History   Social History  . Marital status: Single    Spouse name: N/A  . Number of children: N/A  . Years of education: N/A   Occupational History  . Not on file.   Social History Main Topics  . Smoking status: Never Smoker  . Smokeless tobacco: Never Used  . Alcohol use No  . Drug use: No  . Sexual activity: Not on file   Other Topics Concern  . Not on file   Social History Narrative   Marital status: single      Children: none      Employment: semi-retired;       Tobacco: none   Alcohol: none         No family history on file.     Objective:    BP (!) 149/83   Pulse (!) 59   Temp 98.8 F (37.1 C) (Oral)   Resp 16   Ht 5\' 10"  (1.778 m)   Wt 298 lb 3.2 oz (135.3 kg)   SpO2 96%   BMI 42.79 kg/m  Physical Exam  Constitutional: He is oriented to person, place, and time. He appears well-developed and well-nourished. No distress.  HENT:  Head: Normocephalic and atraumatic.  Eyes: Conjunctivae and EOM are normal. Pupils are equal, round, and reactive to light.  Neck: Normal range of motion. Neck supple. Carotid bruit is not present. No thyromegaly present.  Cardiovascular: Normal rate, regular rhythm, normal heart sounds and intact distal pulses.  Exam reveals no gallop and no friction rub.   No murmur heard. Pulmonary/Chest: Effort normal and breath sounds normal. He has no wheezes. He has no rales.  Lymphadenopathy:    He has no cervical adenopathy.  Neurological: He is alert and oriented to person, place, and time. No cranial nerve deficit.  Skin: Skin is warm and dry. No rash noted. He is not diaphoretic. There is erythema.  LLE with persistent edema 1+ to proximal thigh yet much improved from week prior.  +diffuse flaking and thickened skin anterior shin.   No warmth to skin; non-tender.  Psychiatric: He has a normal mood and affect. His behavior is normal.  Nursing note  and vitals reviewed.  Results for orders placed or performed in visit on 12/24/16  CBC with Differential/Platelet  Result Value Ref Range   WBC 4.8 3.4 - 10.8 x10E3/uL   RBC 4.01 (L) 4.14 - 5.80 x10E6/uL   Hemoglobin 13.3 13.0 - 17.7 g/dL   Hematocrit 38.8 37.5 - 51.0 %   MCV 97 79 - 97 fL   MCH 33.2 (H) 26.6 - 33.0 pg   MCHC 34.3 31.5 - 35.7 g/dL   RDW 13.8 12.3 - 15.4 %   Platelets 138 (L) 150 - 379 x10E3/uL   Neutrophils 47 Not Estab. %   Lymphs 29 Not Estab. %   Monocytes 20 Not Estab. %   Eos 3 Not Estab. %   Basos 0 Not Estab. %   Neutrophils Absolute 2.3 1.4 - 7.0  x10E3/uL   Lymphocytes Absolute 1.4 0.7 - 3.1 x10E3/uL   Monocytes Absolute 1.0 (H) 0.1 - 0.9 x10E3/uL   EOS (ABSOLUTE) 0.2 0.0 - 0.4 x10E3/uL   Basophils Absolute 0.0 0.0 - 0.2 x10E3/uL   Immature Granulocytes 1 Not Estab. %   Immature Grans (Abs) 0.0 0.0 - 0.1 x10E3/uL   Hematology Comments: Note:   Comprehensive metabolic panel  Result Value Ref Range   Glucose 88 65 - 99 mg/dL   BUN 11 8 - 27 mg/dL   Creatinine, Ser 0.91 0.76 - 1.27 mg/dL   GFR calc non Af Amer 82 >59 mL/min/1.73   GFR calc Af Amer 95 >59 mL/min/1.73   BUN/Creatinine Ratio 12 10 - 24   Sodium 141 134 - 144 mmol/L   Potassium 3.9 3.5 - 5.2 mmol/L   Chloride 102 96 - 106 mmol/L   CO2 28 18 - 29 mmol/L   Calcium 8.6 8.6 - 10.2 mg/dL   Total Protein 7.7 6.0 - 8.5 g/dL   Albumin 3.5 3.5 - 4.8 g/dL   Globulin, Total 4.2 1.5 - 4.5 g/dL   Albumin/Globulin Ratio 0.8 (L) 1.2 - 2.2   Bilirubin Total 0.6 0.0 - 1.2 mg/dL   Alkaline Phosphatase 115 39 - 117 IU/L   AST 25 0 - 40 IU/L   ALT 14 0 - 44 IU/L       Assessment & Plan:   1. Venous stasis dermatitis of left lower extremity    -improving with UNABOOT. -s/p replacement of UNABOOT in office. -refill of oxycodone provided.   No orders of the defined types were placed in this encounter.  Meds ordered this encounter  Medications  . oxyCODONE-acetaminophen (PERCOCET/ROXICET) 5-325 MG tablet    Sig: Take 1-2 tablets by mouth every 6 (six) hours as needed for severe pain.    Dispense:  30 tablet    Refill:  0    Return in about 1 week (around 01/07/2017) for recheck leg swelling/UNABOOT.   Norwood Levo, M.D. Primary Care at [facility] previously [facility] [place] [address] [place], [place]  [address] [phone] phone [phone] fax

## 2016-12-31 NOTE — Patient Instructions (Signed)
     IF you received an x-ray today, you will receive an invoice from [facility]. Please contact [facility] at [phone] with questions or concerns regarding your invoice.   IF you received labwork today, you will receive an invoice from [facility]. Please contact [facility] at [phone] with questions or concerns regarding your invoice.   Our billing staff will not be able to assist you with questions regarding bills from these companies.  You will be contacted with the lab results as soon as they are available. The fastest way to get your results is to activate your My Chart account. Instructions are located on the last page of this paperwork. If you have not heard from us regarding the results in 2 weeks, please contact this office.     

## 2017-04-17 ENCOUNTER — Encounter (HOSPITAL_COMMUNITY): Payer: Self-pay

## 2017-04-17 ENCOUNTER — Ambulatory Visit (HOSPITAL_COMMUNITY)
Admission: EM | Admit: 2017-04-17 | Discharge: 2017-04-17 | Disposition: A | Discharge status: Home / Self Care | Payer: Medicare Other | Attending: Family | Admitting: Family

## 2017-04-17 DIAGNOSIS — R600 Localized edema: Secondary | ICD-10-CM

## 2017-04-17 DIAGNOSIS — H109 Unspecified conjunctivitis: Secondary | ICD-10-CM

## 2017-04-17 MED ORDER — MUPIROCIN 2 % EX OINT: 1.0000 "application " | TOPICAL_OINTMENT | Freq: Two times a day (BID) | CUTANEOUS | 0 refills | Status: DC

## 2017-04-17 MED ORDER — DOXYCYCLINE HYCLATE 100 MG PO TBEC: 100.0000 mg | DELAYED_RELEASE_TABLET | Freq: Two times a day (BID) | ORAL | 0 refills | Status: DC

## 2017-04-17 MED ORDER — ERYTHROMYCIN 5 MG/GM OP OINT: TOPICAL_OINTMENT | OPHTHALMIC | 0 refills | Status: DC

## 2017-04-17 NOTE — ED Triage Notes (Signed)
Pt said he has cellulitis in his legs and said he has had it for years and now it's getting worse. Swelling, red and discolored. Has not been on any medication for it. Pt said it is painful to the touch.

## 2017-04-17 NOTE — Discharge Instructions (Signed)
As discussed, likely to see vascular for further evaluation of suspected venous insufficiency and recurrent cellulitis. We'll can start antibiotics this evening. We'll also give you a topical antibiotic to use in the future once acute infection has subsided. Please contact the vascular surgeon information listed on this paperwork.  Ensure to take probiotics while on antibiotics and also for 2 weeks after completion. It is important to re-colonize the gut with good bacteria and also to prevent any diarrheal infections associated with antibiotic use.   Also give you an antibiotic for suspected  bacterial pink eye.  Please return for further evaluation to urgent care or emergency room for any increased swelling, fever, overall not feeling well, leg pain

## 2017-04-17 NOTE — ED Provider Notes (Signed)
[place]    CSN: [id number] Arrival date & time: 04/17/17  1821     History   Chief Complaint Chief Complaint  Patient presents with  . Leg Swelling    HPI Eric Schultz is a 76 y.o. male.   Patient complains of bilateral LE swelling, redness , past month. Elevate 'once and while' and however not helping. Hasn't tried any medications, no compression stockings. Worried about infection. NO fever, chills, purulent discharge.  Has a history of cellulitis in both legs for "years". Has not been on any medication for this.Last saw PCP 12/2016 for leg swelling, pain. UNABOOT applied however states 'didnt really help.'   Numbness in feet.   No CP, sob. No recent surgery, mobilization.  No h/o mrsa  'Infections in both eyes 2-3 months.'  Unchanged. Yellow discharge, particularly in morning.  No eye doctor. Doesn't wear contacts. NO nasal congestion, sore throat, ear pain. No vision changes  Wearing old glasses - needs new pair. No foreign body sensation, eye pain.      seen 4/26, 4/22, and 4/12  at urgent care    Past Medical History:  Diagnosis Date  . Cellulitis    left lower leg  . Gout   . Hypertension    "borderline"  . Sleep apnea     There are no active problems to display for this patient.   History reviewed. No pertinent surgical history.     Home Medications    Prior to Admission medications   Medication Sig Start Date End Date Taking? Authorizing Provider  aspirin 325 MG tablet Take 650 mg by mouth every 4 (four) hours as needed for mild pain.    [provider]  cephALEXin (KEFLEX) 500 MG capsule Take 1 capsule (500 mg total) by mouth 4 (four) times daily. 12/24/16   Wardell Honour, MD  doxycycline (DORYX) 100 MG EC tablet Take 1 tablet (100 mg total) by mouth 2 (two) times daily. 04/17/17   Burnard Hawthorne, FNP  erythromycin ophthalmic ointment Use one half inch four times daily to affected eye (s) x 7 days. 04/17/17   Burnard Hawthorne, FNP  mupirocin ointment (BACTROBAN) 2 % Place 1 application into the nose 2 (two) times daily. 04/17/17   Burnard Hawthorne, FNP  oxyCODONE-acetaminophen (PERCOCET/ROXICET) 5-325 MG tablet Take 1-2 tablets by mouth every 6 (six) hours as needed for severe pain. 12/31/16   Wardell Honour, MD    Family History No family history on file.  Social History Social History  Substance Use Topics  . Smoking status: Never Smoker  . Smokeless tobacco: Never Used  . Alcohol use No     Allergies   Sulfa antibiotics   Review of Systems Review of Systems  Constitutional: Negative for chills and fever.  HENT: Negative for congestion, sinus pain and sore throat.   Eyes: Positive for discharge and redness. Negative for photophobia, pain, itching and visual disturbance.  Respiratory: Negative for cough and shortness of breath.   Cardiovascular: Positive for leg swelling (chronic). Negative for chest pain and palpitations.  Gastrointestinal: Negative for nausea and vomiting.     Physical Exam Triage Vital Signs ED Triage Vitals  Enc Vitals Group     BP 04/17/17 1834 (!) 155/78     Pulse Rate 04/17/17 1834 (!) 58     Resp 04/17/17 1834 (!) 22     Temp 04/17/17 1834 97.8 F (36.6 C)     Temp Source 04/17/17  1834 Oral     SpO2 04/17/17 1834 96 %     Weight --      Height --      Head Circumference --      Peak Flow --      Pain Score 04/17/17 1837 2     Pain Loc --      Pain Edu? --      Excl. in [place]? --    No data found.   Updated Vital Signs BP (!) 155/78 (BP Location: Left Arm)   Pulse (!) 58   Temp 97.8 F (36.6 C) (Oral)   Resp (!) 22   SpO2 96%   Visual Acuity Right Eye Distance:   Left Eye Distance:   Bilateral Distance:    Right Eye Near:   Left Eye Near:    Bilateral Near:     Physical Exam  Constitutional: He appears well-developed and well-nourished.  HENT:  Head: Normocephalic and atraumatic.  Right Ear: Hearing, tympanic membrane, external  ear and ear canal normal. No drainage, swelling or tenderness. Tympanic membrane is not injected, not erythematous and not bulging. No middle ear effusion. No decreased hearing is noted.  Left Ear: Hearing, tympanic membrane, external ear and ear canal normal. No drainage, swelling or tenderness. Tympanic membrane is not injected, not erythematous and not bulging.  No middle ear effusion. No decreased hearing is noted.  Nose: Nose normal. Right sinus exhibits no maxillary sinus tenderness and no frontal sinus tenderness. Left sinus exhibits no maxillary sinus tenderness and no frontal sinus tenderness.  Mouth/Throat: Uvula is midline, oropharynx is clear and moist and mucous membranes are normal. No oropharyngeal exudate, posterior oropharyngeal edema, posterior oropharyngeal erythema or tonsillar abscesses.  Eyes: Pupils are equal, round, and reactive to light. EOM and lids are normal. Lids are everted and swept, no foreign bodies found. Right eye exhibits discharge (purulent). Right eye exhibits no exudate and no hordeolum. No foreign body present in the right eye. Left eye exhibits discharge (purulent). Left eye exhibits no exudate and no hordeolum. No foreign body present in the left eye. Right conjunctiva is injected. Left conjunctiva is injected.  No external eye lesions. Surrounding skin intact.   Bilateral eye:   Diffuse injection of the conjunctiva. No white spots, opacity, or foreign body appreciated. No collection of blood or pus in the anterior chamber. No ciliary flush surrounding iris.   No photophobia or eye pain appreciated during exam.     Cardiovascular: Regular rhythm and normal heart sounds.   BLE +1 non pitting edema. No palpable cords or masses.  No asymmetry in calf size when compared bilaterally LE warm and palpable pedal pulses.   Pulmonary/Chest: Effort normal and breath sounds normal. No respiratory distress. He has no wheezes. He has no rhonchi. He has no rales.    Lymphadenopathy:       Head (right side): No submental, no submandibular, no tonsillar, no preauricular, no posterior auricular and no occipital adenopathy present.       Head (left side): No submental, no submandibular, no tonsillar, no preauricular, no posterior auricular and no occipital adenopathy present.    He has no cervical adenopathy.       Right cervical: No superficial cervical, no deep cervical and no posterior cervical adenopathy present.      Left cervical: No superficial cervical, no deep cervical and no posterior cervical adenopathy present.  Neurological: He is alert.  Skin: Skin is warm and dry. There is erythema.  Discoloration of BLE. Hardened, scaly skin of BLE. Erythema or increased warmth BLE. NO discharge.   Psychiatric: He has a normal mood and affect. His speech is normal and behavior is normal.  Vitals reviewed.    UC Treatments / Results  Labs (all labs ordered are listed, but only abnormal results are displayed) Labs Reviewed - No data to display  EKG  EKG Interpretation None       Radiology No results found.  Procedures Procedures (including critical care time)  Medications Ordered in UC Medications - No data to display   Initial Impression / Assessment and Plan / UC Course  I have reviewed the triage vital signs and the nursing notes.  Pertinent labs & imaging results that were available during my care of the patient were reviewed by me and considered in my medical decision making (see chart for details).      Final Clinical Impressions(s) / UC Diagnoses   Final diagnoses:  Leg edema  Bacterial conjunctivitis of both eyes   Based on long-standing history of bilateral lower extremity swelling, suspected venous insufficiency, and worsening of symptoms over the past month,  I will treat today with doxycycline. Reassured as overall patient feels well. I strongly encouraged him to call vascular surgeon for further and more formal  evaluation of chronic symptom. Eye symptoms most consistent with bacterial conjunctivitis. Will treat empirically. Return precautions given.  New Prescriptions New Prescriptions   DOXYCYCLINE (DORYX) 100 MG EC TABLET    Take 1 tablet (100 mg total) by mouth 2 (two) times daily.   ERYTHROMYCIN OPHTHALMIC OINTMENT    Use one half inch four times daily to affected eye (s) x 7 days.   MUPIROCIN OINTMENT (BACTROBAN) 2 %    Place 1 application into the nose 2 (two) times daily.     Controlled Substance Prescriptions [place] Controlled Substance Registry consulted? Not Applicable   Burnard Hawthorne, [facility] 04/17/17 2011

## 2017-05-13 ENCOUNTER — Encounter (HOSPITAL_COMMUNITY): Payer: Self-pay | Admitting: Physician Assistant

## 2017-05-13 ENCOUNTER — Ambulatory Visit (HOSPITAL_COMMUNITY)
Admission: EM | Admit: 2017-05-13 | Discharge: 2017-05-13 | Disposition: A | Discharge status: Home / Self Care | Payer: Medicare Other | Attending: Family Medicine | Admitting: Family Medicine

## 2017-05-13 DIAGNOSIS — R600 Localized edema: Secondary | ICD-10-CM

## 2017-05-13 MED ORDER — NAPROXEN 375 MG PO TABS: 375.0000 mg | ORAL_TABLET | Freq: Two times a day (BID) | ORAL | 0 refills | Status: DC

## 2017-05-13 NOTE — ED Provider Notes (Signed)
[place]    CSN: [id number] Arrival date & time: 05/13/17  1450     History   Chief Complaint Chief Complaint  Patient presents with  . Leg Swelling    HPI Eric Schultz is a 76 y.o. male.   76 year old male with history of gout, hypertension, chronic swelling of bilateral lower extremity comes in for worsening of pain in the right lower leg for the past 2 weeks. Patient states he has been treated with antibiotics on and off for the pain and infection. Denies chest pain, shortness of breath, weakness, dizziness. Denies fever, chills, night sweats. Denies spreading erythema, increased warmth. Patient has not followed with PCP or vascular surgery for chronic venous insufficiency. Has not been wearing his compression socks or Unaboot for his symptoms. States he is here for pain control.      Past Medical History:  Diagnosis Date  . Cellulitis    left lower leg  . Gout   . Hypertension    "borderline"  . Sleep apnea     There are no active problems to display for this patient.   History reviewed. No pertinent surgical history.     Home Medications    Prior to Admission medications   Medication Sig Start Date End Date Taking? Authorizing Provider  aspirin 325 MG tablet Take 650 mg by mouth every 4 (four) hours as needed for mild pain.    [provider]  cephALEXin (KEFLEX) 500 MG capsule Take 1 capsule (500 mg total) by mouth 4 (four) times daily. 12/24/16   Wardell Honour, MD  doxycycline (DORYX) 100 MG EC tablet Take 1 tablet (100 mg total) by mouth 2 (two) times daily. 04/17/17   Burnard Hawthorne, FNP  erythromycin ophthalmic ointment Use one half inch four times daily to affected eye (s) x 7 days. 04/17/17   Burnard Hawthorne, FNP  mupirocin ointment (BACTROBAN) 2 % Place 1 application into the nose 2 (two) times daily. 04/17/17   Burnard Hawthorne, FNP  naproxen (NAPROSYN) 375 MG tablet Take 1 tablet (375 mg total) by mouth 2 (two) times  daily. 05/13/17   Tasia Catchings, Deania Siguenza V, PA-C  oxyCODONE-acetaminophen (PERCOCET/ROXICET) 5-325 MG tablet Take 1-2 tablets by mouth every 6 (six) hours as needed for severe pain. 12/31/16   Wardell Honour, MD    Family History History reviewed. No pertinent family history.  Social History Social History  Substance Use Topics  . Smoking status: Never Smoker  . Smokeless tobacco: Never Used  . Alcohol use No     Allergies   Sulfa antibiotics   Review of Systems Review of Systems  Reason unable to perform ROS: See HPI as above.     Physical Exam Triage Vital Signs ED Triage Vitals  Enc Vitals Group     BP 05/13/17 1604 132/78     Pulse Rate 05/13/17 1604 78     Resp 05/13/17 1604 18     Temp 05/13/17 1604 98.6 F (37 C)     Temp Source 05/13/17 1604 Oral     SpO2 05/13/17 1604 97 %     Weight --      Height --      Head Circumference --      Peak Flow --      Pain Score 05/13/17 1606 3     Pain Loc --      Pain Edu? --      Excl. in [place]? --  No data found.   Updated Vital Signs BP 132/78 (BP Location: Left Arm)   Pulse 78   Temp 98.6 F (37 C) (Oral)   Resp 18   SpO2 97%   Physical Exam  Constitutional: He is oriented to person, place, and time. He appears well-developed and well-nourished. No distress.  HENT:  Head: Normocephalic and atraumatic.  Eyes: Pupils are equal, round, and reactive to light. Conjunctivae are normal.  Cardiovascular: Normal rate, regular rhythm and normal heart sounds.  Exam reveals no gallop and no friction rub.   No murmur heard. Pulmonary/Chest: Effort normal and breath sounds normal. He has no wheezes. He has no rales.  Musculoskeletal:  Bilateral lower extremity swelling, equal in size. Thickening/dry skin around bilateral lower extremities, with healing scabs. Equal erythema of bilateral extremities, patient states baseline. No obvious increase in warmth. No pitting edema. No calf pain on palpation. Tenderness on palpation of anterior  shin area, pain distractible.   Unable to obtain pedal pulses, unable to assess cap refill due to thickening of toe nails.   Neurological: He is alert and oriented to person, place, and time.     UC Treatments / Results  Labs (all labs ordered are listed, but only abnormal results are displayed) Labs Reviewed - No data to display  EKG  EKG Interpretation None       Radiology No results found.  Procedures Procedures (including critical care time)  Medications Ordered in UC Medications - No data to display   Initial Impression / Assessment and Plan / UC Course  I have reviewed the triage vital signs and the nursing notes.  Pertinent labs & imaging results that were available during my care of the patient were reviewed by me and considered in my medical decision making (see chart for details).    Given without spreading erythema, obvious increase in warmth, fevers, no indication for antibiotics right now. Patient without shortness of breath, chest pain, no tenderness to palpation of calf, and equal swelling on bilateral lower extremity, low suspicion for PE/DVT right now. Naproxen for pain. Discussed with patient will need to follow up with vascular specialist for evaluation of chronic venous insufficiency. Return precautions given.   Final Clinical Impressions(s) / UC Diagnoses   Final diagnoses:  Bilateral lower extremity edema    New Prescriptions Discharge Medication List as of 05/13/2017  4:25 PM    START taking these medications   Details  naproxen (NAPROSYN) 375 MG tablet Take 1 tablet (375 mg total) by mouth 2 (two) times daily., Starting Wed 05/13/2017, Normal         Cathlean Sauer V, PA-C 05/13/17 1747

## 2017-05-13 NOTE — ED Triage Notes (Signed)
Pt  Reports  He  Has   Cellulitis  Of  Legs   He    Pain   And   Swelling   Of  r     Ankle   And      r    Foot      And      Some  Redness       painfull  To  Touch

## 2017-05-13 NOTE — Discharge Instructions (Signed)
Take naproxen for pain. Wear compression socks/unaboot for swelling, elevate your legs. Follow up with vascular specialist to evaluate for chronic venous insufficiency. If experiencing chest pain, shortness of breath, trouble breathing, pain and swelling in the calf, weakness, dizziness, passing out, go to the emergency department for further evaluation.

## 2017-07-10 ENCOUNTER — Other Ambulatory Visit: Payer: Self-pay

## 2017-07-10 ENCOUNTER — Emergency Department (HOSPITAL_COMMUNITY): Payer: Non-veteran care

## 2017-07-10 ENCOUNTER — Emergency Department (HOSPITAL_COMMUNITY)
Admission: EM | Admit: 2017-07-10 | Discharge: 2017-07-10 | Disposition: A | Discharge status: Home / Self Care | Payer: Non-veteran care | Attending: Emergency Medicine | Admitting: Emergency Medicine

## 2017-07-10 ENCOUNTER — Encounter (HOSPITAL_COMMUNITY): Payer: Self-pay | Admitting: *Deleted

## 2017-07-10 DIAGNOSIS — M25571 Pain in right ankle and joints of right foot: Secondary | ICD-10-CM | POA: Insufficient documentation

## 2017-07-10 DIAGNOSIS — R609 Edema, unspecified: Secondary | ICD-10-CM

## 2017-07-10 DIAGNOSIS — M25572 Pain in left ankle and joints of left foot: Secondary | ICD-10-CM | POA: Insufficient documentation

## 2017-07-10 DIAGNOSIS — M25579 Pain in unspecified ankle and joints of unspecified foot: Secondary | ICD-10-CM | POA: Diagnosis present

## 2017-07-10 DIAGNOSIS — E876 Hypokalemia: Secondary | ICD-10-CM | POA: Diagnosis not present

## 2017-07-10 DIAGNOSIS — M79671 Pain in right foot: Secondary | ICD-10-CM | POA: Diagnosis not present

## 2017-07-10 DIAGNOSIS — R0602 Shortness of breath: Secondary | ICD-10-CM | POA: Diagnosis not present

## 2017-07-10 DIAGNOSIS — M79672 Pain in left foot: Secondary | ICD-10-CM | POA: Diagnosis not present

## 2017-07-10 LAB — COMPREHENSIVE METABOLIC PANEL
ALK PHOS: 134 U/L — AB (ref 38–126)
ALT: 28 U/L (ref 17–63)
AST: 36 U/L (ref 15–41)
Albumin: 3.7 g/dL (ref 3.5–5.0)
Anion gap: 5 (ref 5–15)
BUN: 10 mg/dL (ref 6–20)
CO2: 25 mmol/L (ref 22–32)
Calcium: 8.7 mg/dL — ABNORMAL LOW (ref 8.9–10.3)
Chloride: 108 mmol/L (ref 101–111)
Creatinine, Ser: 0.81 mg/dL (ref 0.61–1.24)
GFR calc Af Amer: >60 mL/min (ref >60)
GFR calc non Af Amer: >60 mL/min (ref >60)
Glucose, Bld: 95 mg/dL (ref 65–99)
Potassium: 3.4 mmol/L — ABNORMAL LOW (ref 3.5–5.1)
SODIUM: 138 mmol/L (ref 135–145)
Total Bilirubin: 0.7 mg/dL (ref 0.3–1.2)
Total Protein: 7.3 g/dL (ref 6.5–8.1)

## 2017-07-10 LAB — CBC WITH DIFFERENTIAL/PLATELET
Basophils Absolute: 0 10*3/uL (ref 0.0–0.1)
Basophils Relative: 0 %
Eosinophils Absolute: 0.1 10*3/uL (ref 0.0–0.7)
Eosinophils Relative: 3 %
HCT: 40.6 % (ref 39.0–52.0)
HEMOGLOBIN: 14.1 g/dL (ref 13.0–17.0)
LYMPHS ABS: 1.4 10*3/uL (ref 0.7–4.0)
Lymphocytes Relative: 32 %
MCH: 33.7 pg (ref 26.0–34.0)
MCHC: 34.7 g/dL (ref 30.0–36.0)
MCV: 97.1 fL (ref 78.0–100.0)
MONOS PCT: 9 %
Monocytes Absolute: 0.4 10*3/uL (ref 0.1–1.0)
Neutro Abs: 2.5 10*3/uL (ref 1.7–7.7)
Neutrophils Relative %: 56 %
Platelets: 90 10*3/uL — ABNORMAL LOW (ref 150–400)
RBC: 4.18 MIL/uL — AB (ref 4.22–5.81)
RDW: 13.8 % (ref 11.5–15.5)
WBC: 4.4 10*3/uL (ref 4.0–10.5)

## 2017-07-10 LAB — TROPONIN I: Troponin I: <0.03 ng/mL (ref <0.03)

## 2017-07-10 LAB — BRAIN NATRIURETIC PEPTIDE: B Natriuretic Peptide: 84.8 pg/mL (ref 0.0–100.0)

## 2017-07-10 MED ORDER — FUROSEMIDE 20 MG PO TABS: 20.0000 mg | ORAL_TABLET | Freq: Every day | ORAL | 0 refills | Status: DC

## 2017-07-10 MED ORDER — POTASSIUM CHLORIDE CRYS ER 20 MEQ PO TBCR
40.0000 meq | EXTENDED_RELEASE_TABLET | Freq: Once | ORAL | Status: AC
  Administered 2017-07-10: 40 meq via ORAL
  Filled 2017-07-10: qty 2

## 2017-07-10 MED ORDER — POTASSIUM CHLORIDE CRYS ER 20 MEQ PO TBCR: 20.0000 meq | EXTENDED_RELEASE_TABLET | Freq: Two times a day (BID) | ORAL | 0 refills | Status: DC

## 2017-07-10 MED ORDER — CEPHALEXIN 500 MG PO CAPS: 500.0000 mg | ORAL_CAPSULE | Freq: Four times a day (QID) | ORAL | 0 refills | Status: DC

## 2017-07-10 MED ORDER — FUROSEMIDE 20 MG PO TABS
40.0000 mg | ORAL_TABLET | Freq: Once | ORAL | Status: AC
  Administered 2017-07-10: 40 mg via ORAL
  Filled 2017-07-10: qty 2

## 2017-07-10 NOTE — ED Notes (Signed)
Pt stable, ambulatory, states understanding of discharge instructions 

## 2017-07-10 NOTE — ED Notes (Signed)
Pt returned from xray

## 2017-07-10 NOTE — ED Triage Notes (Addendum)
Pt in c/o bil foot pain, pt has swelling, redness, discoloration, and cracks to bil feet, pt c/o of pain with ambulation, a&o X4, Pt denies SOB, pt appears to have labored breathing

## 2017-07-10 NOTE — ED Notes (Signed)
Secretary called pt's son who is on the way

## 2017-07-10 NOTE — ED Notes (Signed)
Pt dressed ambulating in hall.  Requested pt to wait in room for Dr. Dayna Barker who is aware and will come update him.

## 2017-07-10 NOTE — ED Notes (Signed)
Pt refused wheelchair.  

## 2017-07-10 NOTE — ED Notes (Signed)
Pt refused urinal.  Demanded this RN take all the wires off "NOW".  Notified Dr. Dayna Barker who advised pt able to come off monitor and ambulate.

## 2017-07-11 NOTE — ED Provider Notes (Signed)
[place] EMERGENCY DEPARTMENT Provider Note   CSN: [id number] Arrival date & time: 07/10/17  1518     History   Chief Complaint Chief Complaint  Patient presents with  . Foot Pain    HPI Eric Schultz is a 76 y.o. male.   Foot Pain  This is a recurrent problem. The current episode started more than 2 days ago. The problem occurs constantly. The problem has been gradually worsening. Pertinent negatives include no chest pain, no headaches and no shortness of breath. Nothing aggravates the symptoms. Nothing relieves the symptoms. He has tried nothing for the symptoms.    Past Medical History:  Diagnosis Date  . Cellulitis    left lower leg  . Gout   . Hypertension    "borderline"  . Sleep apnea     There are no active problems to display for this patient.   History reviewed. No pertinent surgical history.     Home Medications    Prior to Admission medications   Medication Sig Start Date End Date Taking? Authorizing Provider  aspirin 325 MG tablet Take 650 mg by mouth every 4 (four) hours as needed for mild pain.    [provider]  cephALEXin (KEFLEX) 500 MG capsule Take 1 capsule (500 mg total) 4 (four) times daily by mouth. 07/10/17   Brynja Marker, Corene Cornea, MD  doxycycline (DORYX) 100 MG EC tablet Take 1 tablet (100 mg total) by mouth 2 (two) times daily. 04/17/17   Burnard Hawthorne, FNP  erythromycin ophthalmic ointment Use one half inch four times daily to affected eye (s) x 7 days. 04/17/17   Burnard Hawthorne, FNP  furosemide (LASIX) 20 MG tablet Take 1 tablet (20 mg total) daily by mouth. 07/10/17   Claretta Kendra, Corene Cornea, MD  mupirocin ointment (BACTROBAN) 2 % Place 1 application into the nose 2 (two) times daily. 04/17/17   Burnard Hawthorne, FNP  naproxen (NAPROSYN) 375 MG tablet Take 1 tablet (375 mg total) by mouth 2 (two) times daily. 05/13/17   Tasia Catchings, Amy V, PA-C  oxyCODONE-acetaminophen (PERCOCET/ROXICET) 5-325 MG tablet Take 1-2 tablets  by mouth every 6 (six) hours as needed for severe pain. 12/31/16   Wardell Honour, MD  potassium chloride SA (K-DUR,KLOR-CON) 20 MEQ tablet Take 1 tablet (20 mEq total) 2 (two) times daily by mouth. 07/10/17   Vester Balthazor, Corene Cornea, MD    Family History No family history on file.  Social History Social History   Tobacco Use  . Smoking status: Never Smoker  . Smokeless tobacco: Never Used  Substance Use Topics  . Alcohol use: No  . Drug use: No     Allergies   Sulfa antibiotics   Review of Systems Review of Systems  Respiratory: Negative for shortness of breath.   Cardiovascular: Positive for leg swelling. Negative for chest pain.  Skin: Positive for wound (bilateral feet).  Neurological: Negative for headaches.  All other systems reviewed and are negative.    Physical Exam Updated Vital Signs BP 139/82 (BP Location: Left Arm)   Pulse 67   Temp 98.1 F (36.7 C) (Oral)   Resp 16   Ht 5\' 11"  (1.803 m)   Wt 124.7 kg (275 lb)   SpO2 98%   BMI 38.35 kg/m   Physical Exam  Constitutional: He appears well-developed and well-nourished.  HENT:  Head: Normocephalic and atraumatic.  Eyes: Conjunctivae and EOM are normal.  Neck: Normal range of motion.  Cardiovascular: Normal rate and regular rhythm.  Exam reveals no friction rub.  No murmur heard. Pulmonary/Chest: Effort normal. No stridor. No respiratory distress.  Abdominal: Soft. He exhibits no distension. There is no tenderness.  Musculoskeletal: Normal range of motion. He exhibits edema (BLE).  Neurological: He is alert.  Skin: Skin is warm and dry.  Dry cracked skin on bottom of both feet  Nursing note and vitals reviewed.    ED Treatments / Results  Labs (all labs ordered are listed, but only abnormal results are displayed) Labs Reviewed  CBC WITH DIFFERENTIAL/PLATELET - Abnormal; Notable for the following components:      Result Value   RBC 4.18 (*)    Platelets 90 (*)    All other components within normal  limits  COMPREHENSIVE METABOLIC PANEL - Abnormal; Notable for the following components:   Potassium 3.4 (*)    Calcium 8.7 (*)    Alkaline Phosphatase 134 (*)    All other components within normal limits  TROPONIN I  BRAIN NATRIURETIC PEPTIDE    EKG  EKG Interpretation None       Radiology Dg Chest 2 View  Result Date: 07/10/2017 CLINICAL DATA:  Shortness of Breath EXAM: CHEST  2 VIEW COMPARISON:  07/20/2015 FINDINGS: Cardiac shadow remains enlarged. Aortic calcifications are again seen. The lungs are well aerated bilaterally with mild interstitial changes which may represent early edema. No focal infiltrate or sizable effusion is seen. No bony abnormality is noted. IMPRESSION: Mild interstitial edema without focal infiltrate. Electronically Signed   By: Inez Catalina M.D.   On: 07/10/2017 20:06    Procedures Procedures (including critical care time)  Medications Ordered in ED Medications  furosemide (LASIX) tablet 40 mg (40 mg Oral Given 07/10/17 2134)  potassium chloride SA (K-DUR,KLOR-CON) CR tablet 40 mEq (40 mEq Oral Given 07/10/17 2134)     Initial Impression / Assessment and Plan / ED Course  I have reviewed the triage vital signs and the nursing notes.  Pertinent labs & imaging results that were available during my care of the patient were reviewed by me and considered in my medical decision making (see chart for details).  Suspect likely new onset HF but patient ready to go before able to assess response to Lasix. States he can follow up with a cardiologist within [place] system for further workup.   Final Clinical Impressions(s) / ED Diagnoses   Final diagnoses:  Foot pain, right  Foot pain, left  Edema, unspecified type  Hypokalemia    ED Discharge Orders        Ordered    furosemide (LASIX) 20 MG tablet  Daily     07/10/17 2136    potassium chloride SA (K-DUR,KLOR-CON) 20 MEQ tablet  2 times daily     07/10/17 2136    cephALEXin (KEFLEX) 500 MG capsule  4  times daily     07/10/17 2136       Sol Odor, Corene Cornea, MD 07/11/17 [phone]

## 2017-09-02 ENCOUNTER — Ambulatory Visit: Payer: Self-pay | Admitting: Podiatry

## 2017-09-16 ENCOUNTER — Ambulatory Visit: Payer: Medicare Other | Admitting: Podiatry

## 2017-09-23 ENCOUNTER — Ambulatory Visit: Payer: Medicare Other | Admitting: Podiatry

## 2017-09-26 ENCOUNTER — Ambulatory Visit (INDEPENDENT_AMBULATORY_CARE_PROVIDER_SITE_OTHER): Payer: Medicare Other

## 2017-09-26 ENCOUNTER — Other Ambulatory Visit: Payer: Self-pay

## 2017-09-26 ENCOUNTER — Encounter (HOSPITAL_COMMUNITY): Payer: Self-pay | Admitting: Emergency Medicine

## 2017-09-26 ENCOUNTER — Ambulatory Visit (HOSPITAL_COMMUNITY)
Admission: EM | Admit: 2017-09-26 | Discharge: 2017-09-26 | Disposition: A | Discharge status: Home / Self Care | Payer: Medicare Other | Attending: Family Medicine | Admitting: Family Medicine

## 2017-09-26 DIAGNOSIS — L03031 Cellulitis of right toe: Secondary | ICD-10-CM | POA: Diagnosis not present

## 2017-09-26 DIAGNOSIS — L089 Local infection of the skin and subcutaneous tissue, unspecified: Secondary | ICD-10-CM | POA: Diagnosis not present

## 2017-09-26 MED ORDER — AMOXICILLIN-POT CLAVULANATE 875-125 MG PO TABS: 1.0000 | ORAL_TABLET | Freq: Two times a day (BID) | ORAL | 0 refills | Status: DC

## 2017-09-26 NOTE — ED Triage Notes (Signed)
Pt states the toe on his R foot is infection for over a month.

## 2017-09-26 NOTE — ED Provider Notes (Signed)
[place]   [id number] 09/26/17 Arrival Time: 7989  ASSESSMENT & PLAN:  1. Cellulitis of toe of right foot     Meds ordered this encounter  Medications  . amoxicillin-clavulanate (AUGMENTIN) 875-125 MG tablet    Sig: Take 1 tablet by mouth 2 (two) times daily.    Dispense:  20 tablet    Refill:  0   Call Podiatry office that was provided and get Toe and toenails taken care of.  Reviewed expectations re: course of current medical issues. Questions answered. Outlined signs and symptoms indicating need for more acute intervention. Patient verbalized understanding. After Visit Summary given.   SUBJECTIVE: History from: patient. Eric Schultz is a 77 y.o. male who presents with complaint of persistent pain in right 4th toe.  He has been having an infection in his right 4th toe for a month. Reports abrupt onset about a month ago. Described symptoms have gradually worsened since beginning.  ROS: As per HPI.   OBJECTIVE:  Vitals:   09/26/17 1241 09/26/17 1242  BP:  (!) 172/84  Pulse: 60   Resp: 18   Temp: 97.6 F (36.4 C)   SpO2: 100%     General appearance: alert; no distress Eyes: PERRLA; EOMI; conjunctiva normal HENT: normocephalic; atraumatic; TMs normal; nasal mucosa normal; oral mucosa normal Neck: supple  Lungs: clear to auscultation bilaterally Heart: regular rate and rhythm Abdomen: soft, non-tender; bowel sounds normal; no masses or organomegaly; no guarding or rebound tenderness Back: no CVA tenderness Extremities: Right LE with venous stasis, Right foot with mycotic toenails.  Right 4th toe with ulcer on medial side with tenderness.  Swelling right 4th toe with erythema. Skin: warm and dry Neurologic: normal gait; normal symmetric reflexes Psychological: alert and cooperative; normal mood and affect  Labs:  Labs Reviewed - No data to display  Imaging: Dg Foot Complete Right  Result Date: 09/26/2017 CLINICAL DATA:  infected for about a  month. Right foot is swollen to the appearance, fourth toe is dark on the medial aspect of the toe and nailbed. Jammed the right foot and later became infected. Patient stated that he is not a diabetic. No prior injury to the right foot EXAM: RIGHT FOOT COMPLETE - 3+ VIEW COMPARISON:  None. FINDINGS: There is no evidence of fracture or dislocation. There is no evidence of arthropathy or other focal bone abnormality. Soft tissues are unremarkable. IMPRESSION: Negative. Electronically Signed   By: Lucrezia Europe M.D.   On: 09/26/2017 13:35    Allergies  Allergen Reactions  . Sulfa Antibiotics Nausea And Vomiting    Past Medical History:  Diagnosis Date  . Cellulitis    left lower leg  . Gout   . Hypertension    "borderline"  . Sleep apnea    Social History   Socioeconomic History  . Marital status: Single    Spouse name: Not on file  . Number of children: Not on file  . Years of education: Not on file  . Highest education level: Not on file  Social Needs  . Financial resource strain: Not on file  . Food insecurity - worry: Not on file  . Food insecurity - inability: Not on file  . Transportation needs - medical: Not on file  . Transportation needs - non-medical: Not on file  Occupational History  . Not on file  Tobacco Use  . Smoking status: Never Smoker  . Smokeless tobacco: Never Used  Substance and Sexual Activity  . Alcohol use:  No  . Drug use: No  . Sexual activity: Not on file  Other Topics Concern  . Not on file  Social History Narrative   Marital status: single      Children: none      Employment: semi-retired;       Tobacco: none      Alcohol: none         No family history on file. History reviewed. No pertinent surgical history.   Lysbeth Penner, FNP 09/26/17 1357

## 2018-01-20 ENCOUNTER — Encounter: Payer: Self-pay | Admitting: Family Medicine

## 2018-04-09 ENCOUNTER — Ambulatory Visit (HOSPITAL_COMMUNITY)
Admission: EM | Admit: 2018-04-09 | Discharge: 2018-04-09 | Disposition: A | Discharge status: Home / Self Care | Payer: Medicare PPO | Attending: Family Medicine | Admitting: Family Medicine

## 2018-04-09 ENCOUNTER — Encounter (HOSPITAL_COMMUNITY): Payer: Self-pay

## 2018-04-09 DIAGNOSIS — G4710 Hypersomnia, unspecified: Secondary | ICD-10-CM

## 2018-04-09 DIAGNOSIS — G4730 Sleep apnea, unspecified: Secondary | ICD-10-CM | POA: Diagnosis not present

## 2018-04-09 DIAGNOSIS — K5901 Slow transit constipation: Secondary | ICD-10-CM

## 2018-04-09 MED ORDER — POLYETHYLENE GLYCOL 3350 17 GM/SCOOP PO POWD: 1.0000 | Freq: Every day | ORAL | 0 refills | Status: DC

## 2018-04-09 NOTE — ED Provider Notes (Signed)
[place]    CSN: [id number] Arrival date & time: 04/09/18  1525     History   Chief Complaint Chief Complaint  Patient presents with  . Issues with sleep apnea    HPI Eric Schultz is a 77 y.o. male.   This is a 77 year old veteran who comes in complaining of increasing somnolence.  He was diagnosed with sleep apnea 10 years ago.  He was prescribed a CPAP machine but this has not been working for him.  He wanted an evaluation for his sleep apnea today.  Patient also has chronic constipation.  Is been taking a laxative followed by chocolate milk which seems to help but the chronic constipation has become a problem.  He is having no acute abdominal pain.     Past Medical History:  Diagnosis Date  . Cellulitis    left lower leg  . Gout   . Hypertension    "borderline"  . Sleep apnea     There are no active problems to display for this patient.   History reviewed. No pertinent surgical history.     Home Medications    Prior to Admission medications   Medication Sig Start Date End Date Taking? Authorizing Provider  aspirin 325 MG tablet Take 650 mg by mouth every 4 (four) hours as needed for mild pain.    [provider]  erythromycin ophthalmic ointment Use one half inch four times daily to affected eye (s) x 7 days. 04/17/17   Burnard Hawthorne, FNP  furosemide (LASIX) 20 MG tablet Take 1 tablet (20 mg total) daily by mouth. 07/10/17   Mesner, Corene Cornea, MD  mupirocin ointment (BACTROBAN) 2 % Place 1 application into the nose 2 (two) times daily. 04/17/17   Burnard Hawthorne, FNP  polyethylene glycol powder (MIRALAX) powder Take 255 g by mouth daily. 04/09/18   Robyn Haber, MD    Family History History reviewed. No pertinent family history.  Social History Social History   Tobacco Use  . Smoking status: Never Smoker  . Smokeless tobacco: Never Used  Substance Use Topics  . Alcohol use: No  . Drug use: No     Allergies     Sulfa antibiotics   Review of Systems Review of Systems  Constitutional: Positive for fatigue.  Gastrointestinal: Positive for constipation.     Physical Exam Triage Vital Signs ED Triage Vitals  Enc Vitals Group     BP 04/09/18 1555 133/71     Pulse Rate 04/09/18 1555 68     Resp 04/09/18 1555 20     Temp 04/09/18 1555 97.6 F (36.4 C)     Temp Source 04/09/18 1555 Oral     SpO2 04/09/18 1555 95 %     Weight --      Height --      Head Circumference --      Peak Flow --      Pain Score 04/09/18 1551 0     Pain Loc --      Pain Edu? --      Excl. in [place]? --    No data found.  Updated Vital Signs BP 133/71 (BP Location: Right Arm)   Pulse 68   Temp 97.6 F (36.4 C) (Oral)   Resp 20   SpO2 95%    Physical Exam  Constitutional: He is oriented to person, place, and time. He appears well-developed and well-nourished.  HENT:  Right Ear: External ear normal.  Left Ear:  External ear normal.  Eyes: Pupils are equal, round, and reactive to light. Conjunctivae are normal.  Neck: Normal range of motion. Neck supple.  Cardiovascular: Normal rate, regular rhythm and normal heart sounds.  Pulmonary/Chest: Effort normal and breath sounds normal.  Abdominal: Soft. Bowel sounds are normal. There is no tenderness.  Musculoskeletal: Normal range of motion.  Neurological: He is alert and oriented to person, place, and time.  Skin: Skin is warm and dry.  Nursing note and vitals reviewed.    UC Treatments / Results  Labs (all labs ordered are listed, but only abnormal results are displayed) Labs Reviewed - No data to display  EKG None  Radiology No results found.  Procedures Procedures (including critical care time)  Medications Ordered in UC Medications - No data to display  Initial Impression / Assessment and Plan / UC Course  I have reviewed the triage vital signs and the nursing notes.  Pertinent labs & imaging results that were available during my care of  the patient were reviewed by me and considered in my medical decision making (see chart for details).   Final Clinical Impressions(s) / UC Diagnoses   Final diagnoses:  Hypersomnia with sleep apnea  Slow transit constipation     Discharge Instructions     You will need to follow-up at the [facility] for the sleep apnea evaluation and treatment.    ED Prescriptions    Medication Sig Dispense Auth. Provider   polyethylene glycol powder (MIRALAX) powder Take 255 g by mouth daily. 255 g Robyn Haber, MD     Controlled Substance Prescriptions [place] Controlled Substance Registry consulted? Not Applicable   Robyn Haber, MD 04/09/18 [phone]

## 2018-04-09 NOTE — ED Triage Notes (Signed)
Pt presents with complaints of issues with sleep apnea

## 2018-04-09 NOTE — Discharge Instructions (Addendum)
You will need to follow-up at the [facility] for the sleep apnea evaluation and treatment.

## 2018-06-06 ENCOUNTER — Ambulatory Visit (HOSPITAL_COMMUNITY)
Admission: EM | Admit: 2018-06-06 | Discharge: 2018-06-06 | Disposition: A | Discharge status: Home / Self Care | Payer: Medicare PPO | Attending: Family Medicine | Admitting: Family Medicine

## 2018-06-06 ENCOUNTER — Encounter (HOSPITAL_COMMUNITY): Payer: Self-pay | Admitting: Family Medicine

## 2018-06-06 DIAGNOSIS — M109 Gout, unspecified: Secondary | ICD-10-CM

## 2018-06-06 MED ORDER — INDOMETHACIN 25 MG PO CAPS: 25.0000 mg | ORAL_CAPSULE | Freq: Three times a day (TID) | ORAL | 0 refills | Status: DC | PRN

## 2018-06-06 NOTE — ED Provider Notes (Signed)
[name]    CSN: [id number] Arrival date & time: 06/06/18  1600     History   Chief Complaint No chief complaint on file.   HPI Eric Schultz is a 77 y.o. male.   c/o gout flair up in his L knee, requesting refill of indomethacin, uncertain which dose to take.  Patient has a documented history of gout in his knees.  He also has an achy right shoulder.  Patient's had numerous episodes of gout in his feet.  He also has swelling in his left lower extremity which is chronic.     Past Medical History:  Diagnosis Date  . Cellulitis    left lower leg  . Gout   . Hypertension    "borderline"  . Sleep apnea     There are no active problems to display for this patient.   History reviewed. No pertinent surgical history.     Home Medications    Prior to Admission medications   Medication Sig Start Date End Date Taking? Authorizing Provider  aspirin 325 MG tablet Take 650 mg by mouth every 4 (four) hours as needed for mild pain.    [provider]  indomethacin (INDOCIN) 25 MG capsule Take 1 capsule (25 mg total) by mouth 3 (three) times daily as needed. 06/06/18   Robyn Haber, MD    Family History No family history on file.  Social History Social History   Tobacco Use  . Smoking status: Never Smoker  . Smokeless tobacco: Never Used  Substance Use Topics  . Alcohol use: No  . Drug use: No     Allergies   Sulfa antibiotics   Review of Systems Review of Systems  Cardiovascular: Positive for leg swelling.  Musculoskeletal: Positive for joint swelling.  All other systems reviewed and are negative.    Physical Exam Triage Vital Signs ED Triage Vitals [06/06/18 1612]  Enc Vitals Group     BP (!) 151/90     Pulse Rate 62     Resp 18     Temp 97.9 F (36.6 C)     Temp src      SpO2 98 %     Weight      Height      Head Circumference      Peak Flow      Pain Score 2     Pain Loc      Pain Edu?      Excl. in [place]?     No data found.  Updated Vital Signs BP (!) 151/90   Pulse 62   Temp 97.9 F (36.6 C)   Resp 18   SpO2 98%    Physical Exam  Constitutional: He is oriented to person, place, and time. He appears well-developed and well-nourished. No distress.  Eyes: Pupils are equal, round, and reactive to light. Conjunctivae are normal.  Neck: Normal range of motion. Neck supple.  Pulmonary/Chest: Effort normal.  Musculoskeletal: Normal range of motion. He exhibits edema and tenderness. He exhibits no deformity.  Patient is tender in the anterior aspect of his left knee diffusely.  It does not appear to be a significant effusion  Neurological: He is alert and oriented to person, place, and time.  Skin:  Brawny induration in left lower extremity with 3+ edema  Nursing note and vitals reviewed.    UC Treatments / Results  Labs (all labs ordered are listed, but only abnormal results are displayed) Labs Reviewed - No  data to display  EKG None  Radiology No results found.  Procedures Procedures (including critical care time)  Medications Ordered in UC Medications - No data to display  Initial Impression / Assessment and Plan / UC Course  I have reviewed the triage vital signs and the nursing notes.  Pertinent labs & imaging results that were available during my care of the patient were reviewed by me and considered in my medical decision making (see chart for details).    Final Clinical Impressions(s) / UC Diagnoses   Final diagnoses:  Acute gout of left knee, unspecified cause   Discharge Instructions   None    ED Prescriptions    Medication Sig Dispense Auth. Provider   indomethacin (INDOCIN) 25 MG capsule Take 1 capsule (25 mg total) by mouth 3 (three) times daily as needed. 30 capsule Robyn Haber, MD     Controlled Substance Prescriptions [place] Controlled Substance Registry consulted? Not Applicable   Robyn Haber, MD 06/06/18 1622

## 2018-06-06 NOTE — ED Triage Notes (Signed)
Pt c/o gout flair up in his L knee, requesting refill of indomethacin

## 2018-06-15 ENCOUNTER — Ambulatory Visit (HOSPITAL_COMMUNITY): Payer: Medicare PPO

## 2018-06-15 ENCOUNTER — Ambulatory Visit (HOSPITAL_COMMUNITY)
Admission: EM | Admit: 2018-06-15 | Discharge: 2018-06-15 | Disposition: A | Discharge status: Home / Self Care | Payer: Medicare PPO | Attending: Family Medicine | Admitting: Family Medicine

## 2018-06-15 ENCOUNTER — Encounter (HOSPITAL_COMMUNITY): Payer: Self-pay | Admitting: Emergency Medicine

## 2018-06-15 DIAGNOSIS — I10 Essential (primary) hypertension: Secondary | ICD-10-CM

## 2018-06-15 DIAGNOSIS — J209 Acute bronchitis, unspecified: Secondary | ICD-10-CM | POA: Diagnosis not present

## 2018-06-15 MED ORDER — FLUTICASONE PROPIONATE 50 MCG/ACT NA SUSP: 1.0000 | Freq: Every day | NASAL | 0 refills | Status: DC

## 2018-06-15 MED ORDER — BENZONATATE 100 MG PO CAPS: 200.0000 mg | ORAL_CAPSULE | Freq: Three times a day (TID) | ORAL | 0 refills | Status: DC

## 2018-06-15 NOTE — ED Provider Notes (Signed)
Patient: Eric Schultz MRN: [id number] DOB: 1941/05/23 PCP: Patient, No Pcp Per     Subjective:  Chief Complaint  Patient presents with  . Cough    HPI: The patient is a 77 y.o. male who presents today for cough x 5 days. [place] the yellow metal." he states it was a lot of work. This was last weekend. He states cough is dry in nature. He denies being short of breath or having wheezing. He does not have asthma/COPD and does not smoke. No fevers/chills that he has recorded. He was chilled at one point, but never took his temperature. He has no ear pain, sinus pain/pressure. His throat is sore from coughing and his nose is congested. He has taken some over the counter medications. He forgets the name of medication. Denies any sick contacts. He has mild swelling in his legs that he has had for years.   Has diagnosis of HTN in his chart. When I asked him about this he said his heart is fine. He denies any chest pain, shortness of breath, headache or vision changes.   Review of Systems  Constitutional: Negative for chills and fever.  HENT: Positive for congestion and sore throat. Negative for ear pain, postnasal drip, rhinorrhea and sinus pressure.   Eyes: Negative for itching.  Respiratory: Positive for cough. Negative for shortness of breath and wheezing.   Cardiovascular: Positive for leg swelling. Negative for chest pain.  Gastrointestinal: Negative for abdominal pain, nausea and vomiting.    Allergies Patient is allergic to sulfa antibiotics.  Past Medical History Patient  has a past medical history of Cellulitis, Gout, Hypertension, and Sleep apnea.  Surgical History Patient  has no past surgical history on file.  Family History Pateint's family history is not on file.  Social History Patient  reports that he has never smoked. He has never used smokeless tobacco. He reports that he does not drink alcohol or use drugs.     Objective: Vitals:   06/15/18 1946  BP: (!) 179/91  Pulse: 77  Resp: 18  Temp: 98.5 F (36.9 C)  SpO2: 97%    160/100 manually in left arm by MD.   There is no height or weight on file to calculate BMI.  Physical Exam  Constitutional:  Obese, disheveled.   HENT:  Right Ear: External ear normal.  Left Ear: External ear normal.  Mouth/Throat: No oropharyngeal exudate.  Cobblestoning on posterior pharynx   Neck: Normal range of motion. Neck supple.  Cardiovascular: Normal rate, regular rhythm and normal heart sounds.  Edema in bilateral lower legs.   Pulmonary/Chest: Effort normal and breath sounds normal. He has no wheezes. He has no rales.  Abdominal: Soft. Bowel sounds are normal.  Lymphadenopathy:    He has no cervical adenopathy.  Vitals reviewed.  CXR: refused.     Assessment/plan: The primary encounter diagnosis was Acute bronchitis, unspecified organism. A diagnosis of Essential hypertension was also pertinent to this visit.  -bronchitis: patient declined CXR. Discussed this his lungs are clear on exam and he has had no fever. Viral in presentation and conservative therapy only warranted at this point. Recommended cool mist humidifier, fluids, honey, robitussin DM and treating him with flonase and tessalon pearls as needed. Discussed if fever/chills, sputum production or worsening symptoms needs to come back for another evaluation. See below.   -HTN: multiple elevated readings. Discussed I am more concerned with this and his leg swelling with  the cough. Wanted to see if heart enlarged on xray. When I discussed this with him he states, "my family has strong hearts, I am fine." I offered HTN treatment with follow up and he states he will follow up with his normal doctor. Discussed at risk for stroke, heart attack, CHF etc. If continues to have such elevated HTN. Precautions given.     Orma Flaming, MD  06/15/2018    Orma Flaming, MD 06/15/18 2043

## 2018-06-15 NOTE — ED Triage Notes (Signed)
Pt c/o coughing x5 days.

## 2018-06-15 NOTE — Discharge Instructions (Addendum)
Viral in nature. Make sure you drink fluids. Cool mist humidifier at night, honey and robitussin Dm. Sent you in tessalon pearls up to three times a day for cough and flonase for post nasal drip.   Im concerned about your blood pressure. I need you to follow up with your doctor in next few days to have this checked.   Please come back if fever or worsening symptoms.

## 2018-07-14 ENCOUNTER — Ambulatory Visit (HOSPITAL_COMMUNITY)
Admission: EM | Admit: 2018-07-14 | Discharge: 2018-07-14 | Disposition: A | Discharge status: Home / Self Care | Payer: Medicare PPO | Attending: Family Medicine | Admitting: Family Medicine

## 2018-07-14 ENCOUNTER — Encounter (HOSPITAL_COMMUNITY): Payer: Self-pay | Admitting: Emergency Medicine

## 2018-07-14 ENCOUNTER — Other Ambulatory Visit: Payer: Self-pay

## 2018-07-14 DIAGNOSIS — K5900 Constipation, unspecified: Secondary | ICD-10-CM

## 2018-07-14 MED ORDER — POLYETHYLENE GLYCOL 3350 17 GM/SCOOP PO POWD: 17.0000 g | Freq: Every day | ORAL | 0 refills | Status: DC

## 2018-07-14 MED ORDER — DOCUSATE SODIUM 50 MG PO CAPS: 100.0000 mg | ORAL_CAPSULE | Freq: Two times a day (BID) | ORAL | 0 refills | Status: DC

## 2018-07-14 MED ORDER — FLEET ENEMA 7-19 GM/118ML RE ENEM: 1.0000 | ENEMA | Freq: Once | RECTAL | 0 refills | Status: AC

## 2018-07-14 NOTE — Discharge Instructions (Signed)
Please use Miralax for moderate to severe constipation. Take 1 capful once a day for the next 2-3 days. Please also start docusate stool softener, twice a day for at least 1 week. If stools become loose, cut down to once a day for another week. If stools remain loose, cut back to 1 pill every other day for a third week. You can stop docusate thereafter and resume as needed for constipation. Please also consider using enema once to help move stool at end of colon.  To help reduce constipation and promote bowel health: 1. Drink at least 64 ounces of water each day 2. Eat plenty of fiber (fruits, vegetables, whole grains, legumes) 3. Be physically active or exercise including walking, jogging, swimming, yoga, etc. 4. For active constipation use a stool softener (docusate) or an osmotic laxative (like Miralax) each day, or as needed.

## 2018-07-14 NOTE — ED Triage Notes (Signed)
Pt is a poor historian.  He says he does not know when he last had a bowel movement and reports some mild rectal bleeding.  He does not report any pain.  Pt has a PCP at the [facility] in [place], but states he cant get an appointment for 6 months with him.

## 2018-07-15 NOTE — ED Provider Notes (Signed)
[place]    CSN: [id number] Arrival date & time: 07/14/18  1201     History   Chief Complaint Chief Complaint  Patient presents with  . Constipation    HPI Eric Schultz is a 77 y.o. male history of gout, hypertension, OSA presenting today for evaluation of constipation.  Patient states that his last true bowel movement was approximately 2 weeks ago.  Since he has had small insufficient bowel movements with straining..  States that he has had issues with constipation his entire life.  Is not currently on any daily maintenance medicine for constipation.  He has had occasional rectal bleeding.  Denies previous colonoscopy.  Denies family history of colon cancer.  Denies abdominal pain, nausea or vomiting.  Eating and drinking like normal.  Denies fevers.  He has not tried any over-the-counter medicines.  He is unsure what has been helpful for him in the past.  Patient states he does not drink much water during the day.  HPI  Past Medical History:  Diagnosis Date  . Cellulitis    left lower leg  . Gout   . Hypertension    "borderline"  . Sleep apnea     There are no active problems to display for this patient.   History reviewed. No pertinent surgical history.     Home Medications    Prior to Admission medications   Medication Sig Start Date End Date Taking? Authorizing Provider  aspirin 325 MG tablet Take 650 mg by mouth every 4 (four) hours as needed for mild pain.    [provider]  benzonatate (TESSALON) 100 MG capsule Take 2 capsules (200 mg total) by mouth 3 (three) times daily. 06/15/18   Orma Flaming, MD  docusate sodium (COLACE) 50 MG capsule Take 2 capsules (100 mg total) by mouth 2 (two) times daily. 07/14/18   Wieters, Hallie C, PA-C  fluticasone (FLONASE) 50 MCG/ACT nasal spray Place 1 spray into both nostrils daily. 06/15/18   Orma Flaming, MD  indomethacin (INDOCIN) 25 MG capsule Take 1 capsule (25 mg total) by mouth 3 (three)  times daily as needed. 06/06/18   Robyn Haber, MD  polyethylene glycol powder (GLYCOLAX/MIRALAX) powder Take 17 g by mouth daily. 07/14/18   Wieters, Elesa Hacker, PA-C    Family History History reviewed. No pertinent family history.  Social History Social History   Tobacco Use  . Smoking status: Never Smoker  . Smokeless tobacco: Never Used  Substance Use Topics  . Alcohol use: No  . Drug use: No     Allergies   Sulfa antibiotics   Review of Systems Review of Systems  Constitutional: Negative for appetite change, fatigue and fever.  Eyes: Negative for redness, itching and visual disturbance.  Respiratory: Negative for shortness of breath.   Cardiovascular: Negative for chest pain and leg swelling.  Gastrointestinal: Positive for constipation. Negative for abdominal distention, abdominal pain, diarrhea, nausea and vomiting.  Genitourinary: Negative for decreased urine volume and difficulty urinating.  Musculoskeletal: Negative for arthralgias and myalgias.  Skin: Negative for color change and rash.  Neurological: Negative for dizziness, syncope, weakness, light-headedness and headaches.     Physical Exam Triage Vital Signs ED Triage Vitals  Enc Vitals Group     BP 07/14/18 1340 (!) 169/98     Pulse Rate 07/14/18 1340 62     Resp --      Temp 07/14/18 1340 (!) 97 F (36.1 C)     Temp Source 07/14/18 1340  Oral     SpO2 07/14/18 1340 94 %     Weight --      Height --      Head Circumference --      [place] Flow --      Pain Score 07/14/18 1341 0     Pain Loc --      Pain Edu? --      Excl. in [place]? --    No data found.  Updated Vital Signs BP (!) 169/98 (BP Location: Right Wrist)   Pulse 62   Temp (!) 97 F (36.1 C) (Oral)   SpO2 94%   Visual Acuity Right Eye Distance:   Left Eye Distance:   Bilateral Distance:    Right Eye Near:   Left Eye Near:    Bilateral Near:     Physical Exam  Constitutional: He is oriented to person, place, and time. He  appears well-developed and well-nourished.  HENT:  Head: Normocephalic and atraumatic.  Mouth/Throat: Oropharynx is clear and moist.  Eyes: Pupils are equal, round, and reactive to light. Conjunctivae and EOM are normal.  Neck: Neck supple.  Cardiovascular: Normal rate and regular rhythm.  No murmur heard. Pulmonary/Chest: Effort normal and breath sounds normal. No respiratory distress.  Breathing comfortably at rest, CTABL, no wheezing, rales or other adventitious sounds auscultated  Abdominal: Soft. There is no tenderness.  Abdomen large due to body habitus, soft, nontender to light and deep palpation throughout abdomen  Musculoskeletal: He exhibits no edema.  Neurological: He is alert and oriented to person, place, and time.  Skin: Skin is warm and dry.  Psychiatric: He has a normal mood and affect.  Nursing note and vitals reviewed.    UC Treatments / Results  Labs (all labs ordered are listed, but only abnormal results are displayed) Labs Reviewed - No data to display  EKG None  Radiology No results found.  Procedures Procedures (including critical care time)  Medications Ordered in UC Medications - No data to display  Initial Impression / Assessment and Plan / UC Course  I have reviewed the triage vital signs and the nursing notes.  Pertinent labs & imaging results that were available during my care of the patient were reviewed by me and considered in my medical decision making (see chart for details).     Discussed with patient obtaining x-ray in order to rule out obstruction given last bowel movement 2 weeks ago.  Patient declined as he does not like the chemicals.  He opted for treatment for constipation given he is not in any pain.  Will provide patient with MiraLAX and docusate to use in conjunction to help move stools.  Also recommend utilizing an enema to help move any stool towards distal end of colon.  Discussed lifestyle modifications, increasing water and  fiber.Discussed strict return precautions. Patient verbalized understanding and is agreeable with plan.  Final Clinical Impressions(s) / UC Diagnoses   Final diagnoses:  Constipation, unspecified constipation type     Discharge Instructions     Please use Miralax for moderate to severe constipation. Take 1 capful once a day for the next 2-3 days. Please also start docusate stool softener, twice a day for at least 1 week. If stools become loose, cut down to once a day for another week. If stools remain loose, cut back to 1 pill every other day for a third week. You can stop docusate thereafter and resume as needed for constipation. Please also consider using enema once  to help move stool at end of colon.  To help reduce constipation and promote bowel health: 1. Drink at least 64 ounces of water each day 2. Eat plenty of fiber (fruits, vegetables, whole grains, legumes) 3. Be physically active or exercise including walking, jogging, swimming, yoga, etc. 4. For active constipation use a stool softener (docusate) or an osmotic laxative (like Miralax) each day, or as needed.   ED Prescriptions    Medication Sig Dispense Auth. Provider   polyethylene glycol powder (GLYCOLAX/MIRALAX) powder Take 17 g by mouth daily. 255 g Wieters, Hallie C, PA-C   docusate sodium (COLACE) 50 MG capsule Take 2 capsules (100 mg total) by mouth 2 (two) times daily. 20 capsule Wieters, Hallie C, PA-C   sodium phosphate (FLEET) 7-19 GM/118ML ENEM Place 133 mLs (1 enema total) rectally once for 1 dose. 230 mL Wieters, Hallie C, PA-C     Controlled Substance Prescriptions [place] Controlled Substance Registry consulted? Not Applicable   Janith Lima, [place] 07/15/18 4166

## 2018-08-19 ENCOUNTER — Encounter (HOSPITAL_COMMUNITY): Payer: Self-pay | Admitting: Emergency Medicine

## 2018-08-19 ENCOUNTER — Ambulatory Visit (HOSPITAL_COMMUNITY)
Admission: EM | Admit: 2018-08-19 | Discharge: 2018-08-19 | Disposition: A | Discharge status: Home / Self Care | Payer: Medicare PPO | Attending: Family Medicine | Admitting: Family Medicine

## 2018-08-19 DIAGNOSIS — R600 Localized edema: Secondary | ICD-10-CM | POA: Insufficient documentation

## 2018-08-19 DIAGNOSIS — I872 Venous insufficiency (chronic) (peripheral): Secondary | ICD-10-CM | POA: Insufficient documentation

## 2018-08-19 MED ORDER — FUROSEMIDE 20 MG PO TABS: 20.0000 mg | ORAL_TABLET | Freq: Every day | ORAL | 0 refills | Status: DC

## 2018-08-19 MED ORDER — IBUPROFEN 800 MG PO TABS: 800.0000 mg | ORAL_TABLET | Freq: Three times a day (TID) | ORAL | 0 refills | Status: DC

## 2018-08-19 MED ORDER — POTASSIUM CHLORIDE CRYS ER 20 MEQ PO TBCR: 20.0000 meq | EXTENDED_RELEASE_TABLET | Freq: Every day | ORAL | 0 refills | Status: DC

## 2018-08-19 NOTE — ED Provider Notes (Signed)
[place]    CSN: [id number] Arrival date & time: 08/19/18  1049     History   Chief Complaint Chief Complaint  Patient presents with  . Foot Pain    HPI PLATO Eric Schultz is a 77 y.o. male.   HPI  Generally noncompliant elderly gentleman who does not have a PCP or go for medical care regularly.  [facility]  He states going to a PCP is "too expensive".  He would like to go to the [facility] for his primary care but states that the wait is too long. He is been to the urgent care center in ER frequently for pedal edema.  He tells me he has "venous stasis dermatitis".  He does not use any compression stockings.  He does not take diuretics.  He does not elevate his feet.  He does not use any lotion to his skin.  He does not watch his salt intake. He is here today for swollen feet and ankles.  Left greater than right.  There is some discoloration of the skin.  No open skin.  No redness or tenderness.  No fever or chills. Denies chest pain.  He denies feeling more short of breath, but is observed to be mildly dyspneic.  He thinks this is his baseline.  Past Medical History:  Diagnosis Date  . Cellulitis    left lower leg  . Gout   . Hypertension    "borderline"  . Sleep apnea     There are no active problems to display for this patient.   History reviewed. No pertinent surgical history.     Home Medications    Prior to Admission medications   Medication Sig Start Date End Date Taking? Authorizing Provider  fluticasone (FLONASE) 50 MCG/ACT nasal spray Place 1 spray into both nostrils daily. 06/15/18  Yes Orma Flaming, MD  indomethacin (INDOCIN) 25 MG capsule Take 1 capsule (25 mg total) by mouth 3 (three) times daily as needed. 06/06/18  Yes Robyn Haber, MD  furosemide (LASIX) 20 MG tablet Take 1 tablet (20 mg total) by mouth daily. 08/19/18   Raylene Everts, MD  ibuprofen (ADVIL,MOTRIN) 800 MG tablet Take 1 tablet (800  mg total) by mouth 3 (three) times daily. 08/19/18   Raylene Everts, MD  polyethylene glycol powder (GLYCOLAX/MIRALAX) powder Take 17 g by mouth daily. 07/14/18   Wieters, Hallie C, PA-C  potassium chloride SA (K-DUR,KLOR-CON) 20 MEQ tablet Take 1 tablet (20 mEq total) by mouth daily. 08/19/18   Raylene Everts, MD    Family History History reviewed. No pertinent family history.  Social History Social History   Tobacco Use  . Smoking status: Never Smoker  . Smokeless tobacco: Never Used  Substance Use Topics  . Alcohol use: No  . Drug use: No     Allergies   Sulfa antibiotics   Review of Systems Review of Systems  Constitutional: Negative for chills and fever.  HENT: Negative for ear pain and sore throat.   Eyes: Negative for pain and visual disturbance.  Respiratory: Negative for cough and shortness of breath.   Cardiovascular: Positive for leg swelling. Negative for chest pain and palpitations.  Gastrointestinal: Negative for abdominal pain and vomiting.  Genitourinary: Negative for dysuria and hematuria.  Musculoskeletal: Negative for arthralgias and back pain.  Skin: Positive for color change. Negative for rash.  Neurological: Negative for seizures and syncope.  All other systems reviewed and are negative.  Physical Exam Triage Vital Signs ED Triage Vitals  Enc Vitals Group     BP 08/19/18 1208 (!) 151/75     Pulse Rate 08/19/18 1208 (!) 56     Resp 08/19/18 1208 20     Temp 08/19/18 1208 97.7 F (36.5 C)     Temp Source 08/19/18 1208 Oral     SpO2 08/19/18 1208 95 %     Weight --      Height --      Head Circumference --      [place] Flow --      Pain Score 08/19/18 1209 3     Pain Loc --      Pain Edu? --      Excl. in [place]? --    No data found.  Updated Vital Signs BP (!) 151/75 (BP Location: Left Arm)   Pulse (!) 56   Temp 97.7 F (36.5 C) (Oral)   Resp 20   SpO2 95%      Physical Exam Constitutional:      General: He is not in  acute distress.    Appearance: He is well-developed. He is obese.     Comments: He walks taking very small steps, holding onto the wall.  Dyspnea with minor exertion.  Somewhat disheveled in appearance  HENT:     Head: Normocephalic and atraumatic.     Mouth/Throat:     Mouth: Mucous membranes are moist.  Eyes:     Conjunctiva/sclera: Conjunctivae normal.     Pupils: Pupils are equal, round, and reactive to light.  Neck:     Musculoskeletal: Normal range of motion.     Vascular: No carotid bruit.  Cardiovascular:     Rate and Rhythm: Normal rate and regular rhythm.     Heart sounds: Murmur present.     Comments: Soft systolic murmur.  No ectopy Pulmonary:     Effort: Pulmonary effort is normal. No respiratory distress.     Comments: Poor air movement.  Faint bibasilar rales Abdominal:     General: There is no distension.     Palpations: Abdomen is soft.     Comments: Large abdominal pannus  Musculoskeletal: Normal range of motion.     Right lower leg: Edema present.     Left lower leg: Edema present.     Comments: He has gross pitting edema from the tibial tuberosity down to the toes bilaterally.  Left is greater than right.  Brawny edema.  Scaling skin.  No tenderness or open wound to suggest infection  Skin:    General: Skin is warm and dry.  Neurological:     General: No focal deficit present.     Mental Status: He is alert. Mental status is at baseline.  Psychiatric:        Mood and Affect: Mood normal.     Comments: Poor judgment      UC Treatments / Results  Labs (all labs ordered are listed, but only abnormal results are displayed) Labs Reviewed - No data to display  EKG None  Radiology No results found.  Procedures Procedures (including critical care time)  Medications Ordered in UC Medications - No data to display  Initial Impression / Assessment and Plan / UC Course  I have reviewed the triage vital signs and the nursing notes.  Pertinent labs &  imaging results that were available during my care of the patient were reviewed by me and considered in my medical decision making (see  chart for details).     I explained to the patient that he has a medical condition that he is allowing to go completely untreated.  This, if it progresses, could limit his mobility, put him in a wheelchair, lead to recurring infections and even amputations.  I have encouraged him to establish with a PCP or go to the [place] on a regular basis Final Clinical Impressions(s) / UC Diagnoses   Final diagnoses:  Pedal edema  Venous stasis dermatitis of both lower extremities     Discharge Instructions     Take the lasix every day for fluid This can reduce your potassium, so take a potassium with it Take ibuprofen 3 x a day with food- as needed for pain Elevate feet Use a good lotion on the legs Return for worsening swelling, pain or redness You need to see a PCP or go to the [place] regularly   ED Prescriptions    Medication Sig Dispense Auth. Provider   furosemide (LASIX) 20 MG tablet Take 1 tablet (20 mg total) by mouth daily. 30 tablet Raylene Everts, MD   potassium chloride SA (K-DUR,KLOR-CON) 20 MEQ tablet Take 1 tablet (20 mEq total) by mouth daily. 30 tablet Raylene Everts, MD   ibuprofen (ADVIL,MOTRIN) 800 MG tablet Take 1 tablet (800 mg total) by mouth 3 (three) times daily. 21 tablet Raylene Everts, MD     Controlled Substance Prescriptions [place] Controlled Substance Registry consulted? Not Applicable   Raylene Everts, MD 08/19/18 1354

## 2018-08-19 NOTE — ED Triage Notes (Signed)
Pt presents to [facility] for assessment of bilateral foot swelling with pain and difficulty ambulating.  Denies SOB or chest pain.  Denies hx of CHF, hx of HTN.

## 2018-08-19 NOTE — Discharge Instructions (Addendum)
Take the lasix every day for fluid This can reduce your potassium, so take a potassium with it Take ibuprofen 3 x a day with food- as needed for pain Elevate feet Use a good lotion on the legs Return for worsening swelling, pain or redness You need to see a PCP or go to the [place] regularly

## 2018-09-13 ENCOUNTER — Emergency Department (HOSPITAL_COMMUNITY)
Admission: EM | Admit: 2018-09-13 | Discharge: 2018-09-13 | Disposition: A | Discharge status: Home / Self Care | Payer: Medicare PPO | Attending: Emergency Medicine | Admitting: Emergency Medicine

## 2018-09-13 ENCOUNTER — Other Ambulatory Visit: Payer: Self-pay

## 2018-09-13 DIAGNOSIS — Z79899 Other long term (current) drug therapy: Secondary | ICD-10-CM | POA: Insufficient documentation

## 2018-09-13 DIAGNOSIS — I10 Essential (primary) hypertension: Secondary | ICD-10-CM | POA: Insufficient documentation

## 2018-09-13 DIAGNOSIS — M79605 Pain in left leg: Secondary | ICD-10-CM | POA: Insufficient documentation

## 2018-09-13 DIAGNOSIS — R609 Edema, unspecified: Secondary | ICD-10-CM | POA: Diagnosis not present

## 2018-09-13 DIAGNOSIS — L03115 Cellulitis of right lower limb: Secondary | ICD-10-CM | POA: Insufficient documentation

## 2018-09-13 MED ORDER — GABAPENTIN 300 MG PO CAPS: 300.0000 mg | ORAL_CAPSULE | Freq: Three times a day (TID) | ORAL | 0 refills | Status: DC

## 2018-09-13 MED ORDER — CEPHALEXIN 500 MG PO CAPS: 500.0000 mg | ORAL_CAPSULE | Freq: Two times a day (BID) | ORAL | 0 refills | Status: AC

## 2018-09-13 NOTE — ED Triage Notes (Signed)
Pt dropped off at tonight via POV.  Pt c/o swelling to both feet for a month, and off and on for years.  Last here `08/19/18 for same.

## 2018-09-13 NOTE — ED Notes (Signed)
Patient verbalizes understanding of discharge instructions. Opportunity for questioning and answers were provided. Armband removed by staff, pt discharged from ED in wheelchair.  

## 2018-09-13 NOTE — Discharge Instructions (Addendum)
Follow up with [place] doctor.

## 2018-09-13 NOTE — ED Provider Notes (Signed)
[place] EMERGENCY DEPARTMENT Provider Note   CSN: [id number] Arrival date & time: 09/13/18  1929     History   Chief Complaint Chief Complaint  Patient presents with  . Foot Swelling    HPI Eric Schultz is a 78 y.o. male.  The history is provided by the patient.  Illness  Location:  Legs Quality:  Pain  Severity:  Mild Onset quality:  Gradual Timing:  Intermittent Progression:  Waxing and waning Chronicity:  Chronic Context:  Patient with chronic venous stasis, worsening pain in bilateral legs with ambulation. Patient with some new redness to RLE. Relieved by:  Nothing Worsened by:  Nothing Associated symptoms: rash   Associated symptoms: no abdominal pain, no chest pain, no congestion, no cough, no diarrhea, no ear pain, no fatigue, no fever, no headaches, no loss of consciousness, no myalgias, no nausea, no rhinorrhea, no shortness of breath, no sore throat, no vomiting and no wheezing     Past Medical History:  Diagnosis Date  . Cellulitis    left lower leg  . Gout   . Hypertension    "borderline"  . Sleep apnea     There are no active problems to display for this patient.   No past surgical history on file.      Home Medications    Prior to Admission medications   Medication Sig Start Date End Date Taking? Authorizing Provider  cephALEXin (KEFLEX) 500 MG capsule Take 1 capsule (500 mg total) by mouth 2 (two) times daily for 10 days. 09/13/18 09/23/18  Josceline Chenard, DO  fluticasone (FLONASE) 50 MCG/ACT nasal spray Place 1 spray into both nostrils daily. 06/15/18   Orma Flaming, MD  furosemide (LASIX) 20 MG tablet Take 1 tablet (20 mg total) by mouth daily. 08/19/18   Raylene Everts, MD  gabapentin (NEURONTIN) 300 MG capsule Take 1 capsule (300 mg total) by mouth 3 (three) times daily for 30 days. 09/13/18 10/13/18  Layn Kye, DO  ibuprofen (ADVIL,MOTRIN) 800 MG tablet Take 1 tablet (800 mg total) by mouth 3 (three)  times daily. 08/19/18   Raylene Everts, MD  indomethacin (INDOCIN) 25 MG capsule Take 1 capsule (25 mg total) by mouth 3 (three) times daily as needed. 06/06/18   Robyn Haber, MD  polyethylene glycol powder (GLYCOLAX/MIRALAX) powder Take 17 g by mouth daily. 07/14/18   Wieters, Hallie C, PA-C  potassium chloride SA (K-DUR,KLOR-CON) 20 MEQ tablet Take 1 tablet (20 mEq total) by mouth daily. 08/19/18   Raylene Everts, MD    Family History No family history on file.  Social History Social History   Tobacco Use  . Smoking status: Never Smoker  . Smokeless tobacco: Never Used  Substance Use Topics  . Alcohol use: No  . Drug use: No     Allergies   Sulfa antibiotics   Review of Systems Review of Systems  Constitutional: Negative for chills, fatigue and fever.  HENT: Negative for congestion, ear pain, rhinorrhea and sore throat.   Eyes: Negative for pain and visual disturbance.  Respiratory: Negative for cough, shortness of breath and wheezing.   Cardiovascular: Positive for leg swelling. Negative for chest pain and palpitations.  Gastrointestinal: Negative for abdominal pain, diarrhea, nausea and vomiting.  Genitourinary: Negative for dysuria and hematuria.  Musculoskeletal: Negative for arthralgias, back pain and myalgias.  Skin: Positive for rash. Negative for color change.  Neurological: Negative for seizures, loss of consciousness, syncope and headaches.  All other systems  reviewed and are negative.    Physical Exam Updated Vital Signs  ED Triage Vitals  Enc Vitals Group     BP 09/13/18 1945 (!) 154/84     Pulse Rate 09/13/18 1945 (!) 57     Resp 09/13/18 1953 18     Temp 09/13/18 1953 (!) 97.4 F (36.3 C)     Temp Source 09/13/18 1953 Oral     SpO2 09/13/18 1945 98 %     Weight 09/13/18 1938 285 lb (129.3 kg)     Height 09/13/18 1938 5\' 11"  (1.803 m)     Head Circumference --      [place] Flow --      Pain Score 09/13/18 1937 0     Pain Loc --       Pain Edu? --      Excl. in [place]? --     Physical Exam Vitals signs and nursing note reviewed.  Constitutional:      General: He is not in acute distress.    Appearance: He is well-developed. He is not ill-appearing.  HENT:     Head: Normocephalic and atraumatic.     Nose: Nose normal.     Mouth/Throat:     Mouth: Mucous membranes are moist.  Eyes:     Conjunctiva/sclera: Conjunctivae normal.     Pupils: Pupils are equal, round, and reactive to light.  Neck:     Musculoskeletal: Neck supple.  Cardiovascular:     Rate and Rhythm: Normal rate and regular rhythm.     Pulses: Normal pulses.     Heart sounds: Normal heart sounds. No murmur.  Pulmonary:     Effort: Pulmonary effort is normal. No respiratory distress.     Breath sounds: Normal breath sounds.  Abdominal:     General: There is no distension.     Palpations: Abdomen is soft.     Tenderness: There is no abdominal tenderness.  Musculoskeletal: Normal range of motion.        General: No swelling.     Right lower leg: Edema present.     Left lower leg: Edema present.  Skin:    General: Skin is warm and dry.     Findings: Rash present.     Comments: Venous stasis changes to bilateral lower extremities, mild redness around chronic changes in RLE  Neurological:     Mental Status: He is alert.      ED Treatments / Results  Labs (all labs ordered are listed, but only abnormal results are displayed) Labs Reviewed - No data to display  EKG None  Radiology No results found.  Procedures Procedures (including critical care time)  Medications Ordered in ED Medications - No data to display   Initial Impression / Assessment and Plan / ED Course  I have reviewed the triage vital signs and the nursing notes.  Pertinent labs & imaging results that were available during my care of the patient were reviewed by me and considered in my medical decision making (see chart for details).     Eric Schultz is a  78 year old male with history of venous stasis who presents the ED with leg swelling.  Patient normal vitals.  No fever.  Patient states that he has had increased pain in both legs especially with ambulation.  He has noticed some new redness in the right lower extremity.  He denies any shortness of breath, chest pain.  Patient is overall well-appearing.  He has chronic venous stasis  changes in bilateral lower extremities.  Left worse than his right which is baseline per previous notes and per the patient.  He is not on any blood thinners.  He has been unable to follow-up with his primary care doctor.  He has some increased redness around his chronic venous stasis changes in his right lower extremity and will start antibiotics.  Has normal vitals.  No fever.  No concern for sepsis.  Patient would likely benefit from compression stockings and was recommended to use them.  Patient with likely some neuropathic pain and will try gabapentin.  However any further pain management needs to be done through primary doctor.  No concern for heart failure as patient is clear breath sounds on exam.  Given return precautions and discharged from ED in good condition.  This chart was dictated using voice recognition software.  Despite best efforts to proofread,  errors can occur which can change the documentation meaning.   Final Clinical Impressions(s) / ED Diagnoses   Final diagnoses:  Peripheral edema  Cellulitis of right lower extremity    ED Discharge Orders         Ordered    gabapentin (NEURONTIN) 300 MG capsule  3 times daily     09/13/18 2145    cephALEXin (KEFLEX) 500 MG capsule  2 times daily     09/13/18 2145           Lennice Sites, DO 09/14/18 4132

## 2018-09-24 ENCOUNTER — Ambulatory Visit (HOSPITAL_COMMUNITY)
Admission: EM | Admit: 2018-09-24 | Discharge: 2018-09-24 | Disposition: A | Discharge status: Home / Self Care | Payer: Medicare PPO | Attending: Internal Medicine | Admitting: Internal Medicine

## 2018-09-24 ENCOUNTER — Encounter (HOSPITAL_COMMUNITY): Payer: Self-pay

## 2018-09-24 DIAGNOSIS — M7989 Other specified soft tissue disorders: Secondary | ICD-10-CM | POA: Diagnosis not present

## 2018-09-24 DIAGNOSIS — M79605 Pain in left leg: Secondary | ICD-10-CM

## 2018-09-24 DIAGNOSIS — M79604 Pain in right leg: Secondary | ICD-10-CM

## 2018-09-24 MED ORDER — IBUPROFEN 800 MG PO TABS: 800.0000 mg | ORAL_TABLET | Freq: Three times a day (TID) | ORAL | 0 refills | Status: DC

## 2018-09-24 MED ORDER — HYDROCODONE-ACETAMINOPHEN 5-325 MG PO TABS: 1.0000 | ORAL_TABLET | Freq: Four times a day (QID) | ORAL | 0 refills | Status: DC | PRN

## 2018-09-24 NOTE — ED Notes (Signed)
Patient able to ambulate independently  

## 2018-09-24 NOTE — ED Provider Notes (Signed)
[place]    CSN: [id number] Arrival date & time: 09/24/18  1453     History   Chief Complaint Chief Complaint  Patient presents with  . Swollen Feet & Legs    HPI Eric Schultz is a 78 y.o. male.   Eric Schultz presents today with pain in his feet and legs, Eric Schultz has chronic swelling and was seen in the ER for this on January 20.  Has been taking ibuprofen with some relief of symptoms but feels that Eric Schultz might need something stronger.  It does not sound like pain is worse, just not resolved.  Eric Schultz has not been wearing compression stockings.    HPI  Past Medical History:  Diagnosis Date  . Cellulitis    left lower leg  . Gout   . Hypertension    "borderline"  . Sleep apnea       History reviewed. No pertinent surgical history.     Home Medications    Prior to Admission medications   Medication Sig Start Date End Date Taking? Authorizing Provider  fluticasone (FLONASE) 50 MCG/ACT nasal spray Place 1 spray into both nostrils daily. 06/15/18   Orma Flaming, MD  furosemide (LASIX) 20 MG tablet Take 1 tablet (20 mg total) by mouth daily. 08/19/18   Raylene Everts, MD  gabapentin (NEURONTIN) 300 MG capsule Take 1 capsule (300 mg total) by mouth 3 (three) times daily for 30 days. 09/13/18 10/13/18  Curatolo, Adam, DO  HYDROcodone-acetaminophen (NORCO/VICODIN) 5-325 MG tablet Take 1 tablet by mouth 4 (four) times daily as needed. 09/24/18   Wynona Luna, MD  ibuprofen (ADVIL,MOTRIN) 800 MG tablet Take 1 tablet (800 mg total) by mouth 3 (three) times daily. 09/24/18   Wynona Luna, MD  indomethacin (INDOCIN) 25 MG capsule Take 1 capsule (25 mg total) by mouth 3 (three) times daily as needed. 06/06/18   Robyn Haber, MD  polyethylene glycol powder (GLYCOLAX/MIRALAX) powder Take 17 g by mouth daily. 07/14/18   Wieters, Hallie C, PA-C  potassium chloride SA (K-DUR,KLOR-CON) 20 MEQ tablet Take 1 tablet (20 mEq total) by mouth daily. 08/19/18   Raylene Everts, MD    Family History History reviewed. No pertinent family history.  Social History Social History   Tobacco Use  . Smoking status: Never Smoker  . Smokeless tobacco: Never Used  Substance Use Topics  . Alcohol use: No  . Drug use: No     Allergies   Sulfa antibiotics   Review of Systems Review of Systems  All other systems reviewed and are negative.    Physical Exam Triage Vital Signs ED Triage Vitals  Enc Vitals Group     BP 09/24/18 1553 (!) 167/96     Pulse Rate 09/24/18 1553 64     Resp 09/24/18 1553 17     Temp 09/24/18 1553 98.7 F (37.1 C)     Temp Source 09/24/18 1553 Oral     SpO2 09/24/18 1553 100 %     Weight --      Height --      Pain Score 09/24/18 1555 6     Pain Loc --    Updated Vital Signs BP (!) 167/96 (BP Location: Right Arm)   Pulse 64   Temp 98.7 F (37.1 C) (Oral)   Resp 17   SpO2 100%   Physical Exam Vitals signs and nursing note reviewed.  Constitutional:      General: Eric Schultz is not in acute distress.  Comments: Alert, nicely groomed  HENT:     Head: Atraumatic.  Eyes:     Comments: Conjugate gaze, no eye redness/drainage  Neck:     Musculoskeletal: Neck supple.  Cardiovascular:     Rate and Rhythm: Normal rate.  Pulmonary:     Effort: No respiratory distress.  Abdominal:     General: There is no distension.  Musculoskeletal: Normal range of motion.  Skin:    General: Skin is warm and dry.     Comments: No cyanosis Bilateral 3-4+ pitting edema, left greater than right.  Not tense.  Marked venous stasis changes with scale and hyperpigmentation, minimal erythema.  No current seeping.  She reports that Eric Schultz has had seeping in the past.  Neurological:     Mental Status: Eric Schultz is alert and oriented to person, place, and time.       Final Clinical Impressions(s) / UC Diagnoses   Final diagnoses:  Leg swelling  Pain in both lower extremities  Reviewed the rationale for venous compression stockings with the patient,  and encouraged him to give this a try.   Discharge Instructions     Controlling the leg swelling with compression stockings will help decrease pain in your legs.  Put the stockings on first thing after awakening and take off at bedtime.  Prescription for stockings given.; the pharmacy or medical supply store will measure your legs to get a good fit.  Ok to continue with ibuprofen (prescription sent to the pharmacy); prescription for a small number of vicodin (10 tablets) for emergency pain was also sent to the pharmacy.  Contact information for some primary care providers is in this handout.   ED Prescriptions    Medication Sig Dispense Auth. Provider   ibuprofen (ADVIL,MOTRIN) 800 MG tablet Take 1 tablet (800 mg total) by mouth 3 (three) times daily. 21 tablet Wynona Luna, MD   HYDROcodone-acetaminophen (NORCO/VICODIN) 5-325 MG tablet Take 1 tablet by mouth 4 (four) times daily as needed. 10 tablet Wynona Luna, MD     Controlled Substance Prescriptions [place] Controlled Substance Registry consulted? Yes, I have consulted the [place] Controlled Substances Registry for this patient, and feel the risk/benefit ratio today is favorable for proceeding with this prescription for a controlled substance.   Wynona Luna, MD 09/29/18 [phone]

## 2018-09-24 NOTE — Discharge Instructions (Addendum)
Controlling the leg swelling with compression stockings will help decrease pain in your legs.  Put the stockings on first thing after awakening and take off at bedtime.  Prescription for stockings given.; the pharmacy or medical supply store will measure your legs to get a good fit.  Ok to continue with ibuprofen (prescription sent to the pharmacy); prescription for a small number of vicodin (10 tablets) for emergency pain was also sent to the pharmacy.  Contact information for some primary care providers is in this handout.

## 2018-09-24 NOTE — ED Triage Notes (Signed)
Pt presents with ongoing swelling & pain in feet and legs.

## 2018-11-30 ENCOUNTER — Encounter (HOSPITAL_COMMUNITY): Payer: Self-pay

## 2018-11-30 ENCOUNTER — Emergency Department (HOSPITAL_COMMUNITY)
Admission: EM | Admit: 2018-11-30 | Discharge: 2018-11-30 | Disposition: A | Discharge status: Home / Self Care | Payer: No Typology Code available for payment source | Attending: Emergency Medicine | Admitting: Emergency Medicine

## 2018-11-30 ENCOUNTER — Emergency Department (HOSPITAL_COMMUNITY): Payer: No Typology Code available for payment source

## 2018-11-30 ENCOUNTER — Other Ambulatory Visit: Payer: Self-pay

## 2018-11-30 DIAGNOSIS — I10 Essential (primary) hypertension: Secondary | ICD-10-CM | POA: Insufficient documentation

## 2018-11-30 DIAGNOSIS — Z79899 Other long term (current) drug therapy: Secondary | ICD-10-CM | POA: Insufficient documentation

## 2018-11-30 DIAGNOSIS — R109 Unspecified abdominal pain: Secondary | ICD-10-CM | POA: Diagnosis not present

## 2018-11-30 DIAGNOSIS — B372 Candidiasis of skin and nail: Secondary | ICD-10-CM

## 2018-11-30 DIAGNOSIS — M545 Low back pain, unspecified: Secondary | ICD-10-CM

## 2018-11-30 LAB — URINALYSIS, ROUTINE W REFLEX MICROSCOPIC
Bilirubin Urine: NEGATIVE
Glucose, UA: NEGATIVE mg/dL
Hgb urine dipstick: NEGATIVE
Ketones, ur: NEGATIVE mg/dL
Leukocytes,Ua: NEGATIVE
Nitrite: NEGATIVE
Protein, ur: NEGATIVE mg/dL
Specific Gravity, Urine: 1.012 (ref 1.005–1.030)
pH: 6 (ref 5.0–8.0)

## 2018-11-30 MED ORDER — HYDROCODONE-ACETAMINOPHEN 5-325 MG PO TABS
2.0000 | ORAL_TABLET | Freq: Once | ORAL | Status: AC
  Administered 2018-11-30: 12:00:00 2 via ORAL
  Filled 2018-11-30: qty 2

## 2018-11-30 MED ORDER — NYSTATIN 100000 UNIT/GM EX POWD: Freq: Three times a day (TID) | CUTANEOUS | 0 refills | Status: DC

## 2018-11-30 MED ORDER — HYDROCODONE-ACETAMINOPHEN 5-325 MG PO TABS: 1.0000 | ORAL_TABLET | Freq: Four times a day (QID) | ORAL | 0 refills | Status: DC | PRN

## 2018-11-30 MED ORDER — NYSTATIN 100000 UNIT/GM EX POWD
Freq: Once | CUTANEOUS | Status: AC
  Administered 2018-11-30: 14:00:00 via TOPICAL
  Filled 2018-11-30: qty 15

## 2018-11-30 NOTE — ED Triage Notes (Signed)
Pt arrives for bilateral upper leg and knee pain. Pt reports hurts to stand, gets better after walking a few steps. Pt alert and oriented.

## 2018-11-30 NOTE — ED Provider Notes (Signed)
[facility] EMERGENCY DEPARTMENT Provider Note   CSN: [id number] Arrival date & time: 11/30/18  1138    History   Chief Complaint Chief Complaint  Patient presents with  . Leg Pain    HPI Eric Schultz is a 78 y.o. male.     HPI  78 yo M here w/ anterior abd wall/groin pain. Pt reports that for "years," he's had intermittent sharp, stabbing pain in his b/l groin and upper legs. The pain seems to hit him "in waves" and is worse when initially moving, but then improves. He has some associated pain in his lower back as well, though he does not feel like the pain wraps around his abdomen. He notes he has some urinary frequency "for a while" as well, and notes that the sharp pain does seem to relieve itself w/ urination. No flank pain. No h/o kidney stones. He reports that his pain shoots around his lower abdomen. No nausea, vomiting, diarrhea, or constipation. He also notes a burning component to the pain along his anterior lower abdominal wall and groin skin folds, and he's noticed a red, painful, itchy rash for "a few weeks." No discharge. No fever or chills. No change in pain w/ eating or drinking. No loss of bowel or bladder fxn, no LE weakness or numbness. He has chronic swelling of his b/l legs but denies any change. Denies h/o DVT or PE.  Past Medical History:  Diagnosis Date  . Cellulitis    left lower leg  . Gout   . Hypertension    "borderline"  . Sleep apnea     There are no active problems to display for this patient.   History reviewed. No pertinent surgical history.      Home Medications    Prior to Admission medications   Medication Sig Start Date End Date Taking? Authorizing Provider  fluticasone (FLONASE) 50 MCG/ACT nasal spray Place 1 spray into both nostrils daily. 06/15/18   Orma Flaming, MD  furosemide (LASIX) 20 MG tablet Take 1 tablet (20 mg total) by mouth daily. 08/19/18   Raylene Everts, MD  gabapentin (NEURONTIN) 300 MG  capsule Take 1 capsule (300 mg total) by mouth 3 (three) times daily for 30 days. 09/13/18 10/13/18  Curatolo, Adam, DO  HYDROcodone-acetaminophen (NORCO/VICODIN) 5-325 MG tablet Take 1-2 tablets by mouth every 6 (six) hours as needed for severe pain. 11/30/18   Duffy Bruce, MD  ibuprofen (ADVIL,MOTRIN) 800 MG tablet Take 1 tablet (800 mg total) by mouth 3 (three) times daily. 09/24/18   Wynona Luna, MD  indomethacin (INDOCIN) 25 MG capsule Take 1 capsule (25 mg total) by mouth 3 (three) times daily as needed. 06/06/18   Robyn Haber, MD  nystatin (MYCOSTATIN/NYSTOP) powder Apply topically 3 (three) times daily. To groin area/rash for 10 days 11/30/18   Duffy Bruce, MD  polyethylene glycol powder (GLYCOLAX/MIRALAX) powder Take 17 g by mouth daily. 07/14/18   Wieters, Hallie C, PA-C  potassium chloride SA (K-DUR,KLOR-CON) 20 MEQ tablet Take 1 tablet (20 mEq total) by mouth daily. 08/19/18   Raylene Everts, MD    Family History History reviewed. No pertinent family history.  Social History Social History   Tobacco Use  . Smoking status: Never Smoker  . Smokeless tobacco: Never Used  Substance Use Topics  . Alcohol use: No  . Drug use: No     Allergies   Sulfa antibiotics   Review of Systems Review of Systems  Constitutional: Negative  for chills, fatigue and fever.  HENT: Negative for congestion and rhinorrhea.   Eyes: Negative for visual disturbance.  Respiratory: Negative for cough, shortness of breath and wheezing.   Cardiovascular: Negative for chest pain and leg swelling.  Gastrointestinal: Negative for abdominal pain, diarrhea, nausea and vomiting.  Genitourinary: Negative for dysuria and flank pain.  Musculoskeletal: Positive for arthralgias, back pain and myalgias. Negative for neck pain and neck stiffness.  Skin: Positive for rash. Negative for wound.  Allergic/Immunologic: Negative for immunocompromised state.  Neurological: Negative for syncope,  weakness and headaches.  All other systems reviewed and are negative.    Physical Exam Updated Vital Signs BP (!) 154/82   Pulse 60   Temp (!) 97.5 F (36.4 C) (Oral)   Resp 16   Ht 5\' 11"  (1.803 m)   Wt 124.7 kg   SpO2 95%   BMI 38.35 kg/m   Physical Exam Vitals signs and nursing note reviewed.  Constitutional:      General: He is not in acute distress.    Appearance: He is well-developed.  HENT:     Head: Normocephalic and atraumatic.  Eyes:     Conjunctiva/sclera: Conjunctivae normal.  Neck:     Musculoskeletal: Neck supple.  Cardiovascular:     Rate and Rhythm: Normal rate and regular rhythm.     Heart sounds: Normal heart sounds. No murmur. No friction rub.  Pulmonary:     Effort: Pulmonary effort is normal. No respiratory distress.     Breath sounds: Normal breath sounds. No wheezing or rales.  Abdominal:     General: Abdomen is flat. There is no distension.     Palpations: Abdomen is soft.     Tenderness: There is no abdominal tenderness. There is no guarding or rebound.     Comments: No focal abd TTP. No CVAT.  Musculoskeletal:     Comments: No overt midline TTP across T or L spine. Mild paraspinal TTP lumbosacral spine.  Skin:    General: Skin is warm.     Capillary Refill: Capillary refill takes less than 2 seconds.     Comments: Erythematous, beefy red rash to lower abdominal pannus and inguinal folds. No induration. No drainage. No fluctuance.  Neurological:     Mental Status: He is alert and oriented to person, place, and time.     Motor: No abnormal muscle tone.      ED Treatments / Results  Labs (all labs ordered are listed, but only abnormal results are displayed) Labs Reviewed  URINALYSIS, ROUTINE W REFLEX MICROSCOPIC    EKG None  Radiology Ct Renal Stone Study  Result Date: 11/30/2018 CLINICAL DATA:  Bilateral flank pain. EXAM: CT ABDOMEN AND PELVIS WITHOUT CONTRAST TECHNIQUE: Multidetector CT imaging of the abdomen and pelvis was  performed following the standard protocol without IV contrast. COMPARISON:  None. FINDINGS: Lower chest: Linear and reticular lung base opacities are noted consistent with fibrosis. Heart borderline enlarged. No acute findings. Hepatobiliary: Morphologic changes of cirrhosis with central volume loss, enlargement of the lateral segment of the left lobe and caudate lobe and surface nodularity. No discrete liver mass. Multiple dependent gallstones. No gallbladder wall thickening or adjacent inflammation. No bile duct dilation. Pancreas: No mass or inflammation. 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 Lipoma measures 5.4 cm in greatest dimension. Spleen: Enlarged measuring 17.9 x 6.2 x 15.1 cm. No splenic mass or focal lesion. Adrenals/Urinary Tract: No adrenal masses.  Mild left renal cortical thinning. 4.6 cm exophytic midpole left renal mass, low in attenuation consistent with a cyst. 2.2 cm low-attenuation right midpole renal mass also consistent with a cyst. Three small nonobstructing stones noted in the right kidney. No left intrarenal stones. No hydronephrosis. Both ureters are normal in course and in caliber. No ureteral stones. Normal bladder. Stomach/Bowel: Stomach is unremarkable. Small bowel and colon are normal in caliber. No wall thickening or inflammation. There are scattered colonic diverticula without diverticulitis. Normal appendix visualized. Vascular/Lymphatic: Prominent external iliac and inguinal lymph nodes are noted bilaterally largest node a right inguinal node measuring 14 mm in short axis. Diffuse aortic atherosclerotic calcifications.  No aneurysm. Reproductive: Unremarkable. Other: Small fat containing umbilical hernia. No other hernia. No ascites. Musculoskeletal: No fracture or acute finding. No osteoblastic or osteolytic lesions. IMPRESSION: 1. No acute findings within the abdomen or pelvis. 2. Cirrhosis with  portal venous hypertension, the latter reflected by splenomegaly. No discrete liver mass. 3. Gallstones.  No acute cholecystitis. 4. Low-density renal masses consistent with cysts. Three nonobstructing stones in the right kidney. 5. Colonic diverticula without diverticulitis. 6. Aortic atherosclerosis. Electronically Signed   By: Lajean Manes M.D.   On: 11/30/2018 13:07    Procedures Procedures (including critical care time)  Medications Ordered in ED Medications  HYDROcodone-acetaminophen (NORCO/VICODIN) 5-325 MG per tablet 2 tablet (2 tablets Oral Given 11/30/18 1214)  nystatin (MYCOSTATIN/NYSTOP) topical powder ( Topical Given 11/30/18 1340)     Initial Impression / Assessment and Plan / ED Course  I have reviewed the triage vital signs and the nursing notes.  Pertinent labs & imaging results that were available during my care of the patient were reviewed by me and considered in my medical decision making (see chart for details).  Clinical Course as of Nov 29 1352  Tue Nov 30, 2018  1240 78 yo M with PMHx obesity, venous stasis, OSA, poor outpt PCP f/u, here with intermittent crampy, sharp lower back and groin pain. DDx is broad, includes UTI w/ bladder spasm, lumbar radicular pain, unlikely AAA. Pain is not c/w dissection. Pain also reproduces on palpation of what appears to be a candidal intertrigo, which could be contributing. No signs of bacterial superinfection or abscess. Will check UA, CT stone. Refuses labs and he is o/w well appearing and in NAD, so I think this is reasonable.   [CI]    Clinical Course User Index [CI] Duffy Bruce, MD       CT scan shows no acute findings. Pt has cirrhosis which looks like is a known issue, will have him f/u with PCP. Gallstones noted - no RUQ TTP on my exam, no n/v or signs of cholecystitis. Renal cysts noted. No AAA. Pt feels markeldy improved here in ED. UA neg.  Will tx for likely MSK pain, start on nystatin, and advise outpt follow-up.   Final Clinical Impressions(s) / ED Diagnoses   Final diagnoses:  Candidal intertrigo  Intermittent low back pain  Abdominal wall pain    ED Discharge Orders         Ordered    HYDROcodone-acetaminophen (NORCO/VICODIN) 5-325 MG tablet  Every 6 hours PRN     11/30/18 1354    nystatin (MYCOSTATIN/NYSTOP) powder  3 times daily     11/30/18 1354           Duffy Bruce, MD 11/30/18 1355

## 2018-11-30 NOTE — ED Notes (Signed)
Patient verbalizes understanding of discharge instructions. Opportunity for questioning and answers were provided. Armband removed by staff, pt discharged from ED. Pt wheeled to lobby. Prescriptions and follow up care reviewed  

## 2018-11-30 NOTE — ED Notes (Signed)
Patient transported to CT 

## 2018-11-30 NOTE — ED Notes (Signed)
Pt aware of need for urine sample.  

## 2018-11-30 NOTE — ED Notes (Signed)
Pharmacy messaged about unverified meds 

## 2019-01-03 ENCOUNTER — Other Ambulatory Visit: Payer: Self-pay

## 2019-01-03 ENCOUNTER — Encounter (HOSPITAL_COMMUNITY): Payer: Self-pay

## 2019-01-03 ENCOUNTER — Ambulatory Visit (HOSPITAL_COMMUNITY)
Admission: EM | Admit: 2019-01-03 | Discharge: 2019-01-03 | Disposition: A | Discharge status: Home / Self Care | Payer: Medicare PPO | Attending: Family Medicine | Admitting: Family Medicine

## 2019-01-03 DIAGNOSIS — M10071 Idiopathic gout, right ankle and foot: Secondary | ICD-10-CM | POA: Diagnosis not present

## 2019-01-03 DIAGNOSIS — M109 Gout, unspecified: Secondary | ICD-10-CM

## 2019-01-03 MED ORDER — INDOMETHACIN 25 MG PO CAPS: 25.0000 mg | ORAL_CAPSULE | Freq: Three times a day (TID) | ORAL | 0 refills | Status: DC | PRN

## 2019-01-03 NOTE — ED Triage Notes (Signed)
Patient presents to Urgent Care with complaints of needing a refill on his gout meds since having a flare up 4-5 days ago. Patient reports he is not sure when he ran out.

## 2019-01-03 NOTE — ED Provider Notes (Signed)
[facility]   [id number] 01/03/19 Arrival Time: 1101  ASSESSMENT & PLAN:  1. Podagra    Refilled: Meds ordered this encounter  Medications  . indomethacin (INDOCIN) 25 MG capsule    Sig: Take 1 capsule (25 mg total) by mouth 3 (three) times daily as needed (for gout).    Dispense:  30 capsule    Refill:  0   Reports the 25mg  tabs work.  Follow-up Information    [facility]   Why:  As needed. Contact information: [address] [place] [address] [phone]          Reviewed expectations re: course of current medical issues. Questions answered. Outlined signs and symptoms indicating need for more acute intervention. Patient verbalized understanding. After Visit Summary given.  SUBJECTIVE: History from: patient. Eric Schultz is a 78 y.o. male who reports a gout flare over the past 4-5 days; R great toe. Describes as an ache with sharp, transient exacerbations. H/O similar that "feels exactly the same". Usually Indocin works but he has run out. No known triggers. Able to bear weight on RLE but with pain. OTC analgesics with minimal relief. Afebrile. No recent illnesses. No toe injury reported.  No past surgical history on file.   ROS: As per HPI. All other systems negative.   OBJECTIVE:  Vitals:   01/03/19 1137  BP: (!) 148/87  Pulse: (!) 56  Resp: 18  Temp: 97.6 F (36.4 C)  TempSrc: Oral  SpO2: 100%    General appearance: alert; no distress Extremities: . RLE: chronic venous stasis skin changes; R great toe with moderate erythema and slight warmth; no drainage or areas of fluctuance Skin: warm and dry Neurologic: normal sensation of RLE; normal strength of RLE Psychological: alert and cooperative; normal mood and affect  Allergies  Allergen Reactions  . Sulfa Antibiotics Nausea And Vomiting    Past Medical History:  Diagnosis Date  . Cellulitis    left lower leg  . Gout   . Hypertension    "borderline"  . Sleep  apnea    Social History   Socioeconomic History  . Marital status: Single    Spouse name: Not on file  . Number of children: Not on file  . Years of education: Not on file  . Highest education level: Not on file  Occupational History  . Not on file  Social Needs  . Financial resource strain: Not on file  . Food insecurity:    Worry: Not on file    Inability: Not on file  . Transportation needs:    Medical: Not on file    Non-medical: Not on file  Tobacco Use  . Smoking status: Never Smoker  . Smokeless tobacco: Never Used  Substance and Sexual Activity  . Alcohol use: No  . Drug use: No  . Sexual activity: Not on file  Lifestyle  . Physical activity:    Days per week: Not on file    Minutes per session: Not on file  . Stress: Not on file  Relationships  . Social connections:    Talks on phone: Not on file    Gets together: Not on file    Attends religious service: Not on file    Active member of club or organization: Not on file    Attends meetings of clubs or organizations: Not on file    Relationship status: Not on file  Other Topics Concern  . Not on file  Social History Narrative  Marital status: single      Children: none      Employment: semi-retired;       Tobacco: none      Alcohol: none          No past surgical history on file.    Vanessa Kick, MD 01/03/19 1209

## 2019-01-12 ENCOUNTER — Ambulatory Visit (INDEPENDENT_AMBULATORY_CARE_PROVIDER_SITE_OTHER): Payer: Medicare PPO | Admitting: Podiatry

## 2019-01-12 ENCOUNTER — Encounter: Payer: Self-pay | Admitting: Podiatry

## 2019-01-12 ENCOUNTER — Other Ambulatory Visit: Payer: Self-pay

## 2019-01-12 ENCOUNTER — Other Ambulatory Visit: Payer: Self-pay | Admitting: Podiatry

## 2019-01-12 ENCOUNTER — Ambulatory Visit (INDEPENDENT_AMBULATORY_CARE_PROVIDER_SITE_OTHER): Payer: Medicare PPO

## 2019-01-12 VITALS — BP 179/96 | HR 64 | Temp 98.2°F | Resp 16

## 2019-01-12 DIAGNOSIS — M79672 Pain in left foot: Secondary | ICD-10-CM

## 2019-01-12 DIAGNOSIS — B351 Tinea unguium: Secondary | ICD-10-CM

## 2019-01-12 DIAGNOSIS — M79674 Pain in right toe(s): Secondary | ICD-10-CM

## 2019-01-12 DIAGNOSIS — M779 Enthesopathy, unspecified: Secondary | ICD-10-CM | POA: Diagnosis not present

## 2019-01-12 DIAGNOSIS — M79675 Pain in left toe(s): Secondary | ICD-10-CM

## 2019-01-12 DIAGNOSIS — M205X1 Other deformities of toe(s) (acquired), right foot: Secondary | ICD-10-CM | POA: Diagnosis not present

## 2019-01-12 DIAGNOSIS — M79671 Pain in right foot: Secondary | ICD-10-CM

## 2019-01-12 DIAGNOSIS — M205X9 Other deformities of toe(s) (acquired), unspecified foot: Secondary | ICD-10-CM

## 2019-01-12 MED ORDER — TRIAMCINOLONE ACETONIDE 10 MG/ML IJ SUSP
10.0000 mg | Freq: Once | INTRAMUSCULAR | Status: AC
  Administered 2019-01-12: 14:00:00 10 mg

## 2019-01-12 NOTE — Progress Notes (Signed)
   Subjective:    Patient ID: Eric Schultz, male    DOB: May 27, 1941, 78 y.o.   MRN: [id number]  HPI    Review of Systems  All other systems reviewed and are negative.      Objective:   Physical Exam        Assessment & Plan:

## 2019-01-13 NOTE — Progress Notes (Signed)
Subjective:   Patient ID: Eric Schultz, male   DOB: 78 y.o.   MRN: [id number]   HPI Patient presents with a lot of pain in the big toe joint right swelling in his lower legs that is chronic and severely thickened nailbeds 1-5 both feet that he cannot cut and become irritated.  Patient states that he has some form of cellulitis and it is been going on for years and his feet feel stiff.  Patient does not smoke and is not active   Review of Systems  All other systems reviewed and are negative.       Objective:  Physical Exam Vitals signs and nursing note reviewed.  Constitutional:      Appearance: He is well-developed.  Pulmonary:     Effort: Pulmonary effort is normal.  Musculoskeletal: Normal range of motion.  Skin:    General: Skin is warm.  Neurological:     Mental Status: He is alert.     Vascular status found to be diminished with pulses PT +1/4 and DP +1/4 with severe dry skin formation bilateral with swelling of the lower leg bilateral that is related most likely to venous or lymphatic disease.  Patient has inflammation pain of the first MPJ right with fluid buildup around the joint and is noted to have severely thickened dystrophic nailbeds 1-5 both feet that are impossible for him to cut.  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  I then debrided nailbeds 1-5 both feet with no iatrogenic bleeding and instructed on routine nail care which we will do and to come in earlier if any issues were to occur.  Discussed his  venous disease and do not recommend treatment unless it were to get worse and patient has tried compression therapy that has been difficult for him to do  X-rays indicate that there is moderate arthritis and no indications currently of structural hallux limitus deformity bilateral

## 2019-02-23 ENCOUNTER — Telehealth (HOSPITAL_COMMUNITY): Payer: Self-pay | Admitting: Internal Medicine

## 2019-02-23 ENCOUNTER — Other Ambulatory Visit: Payer: Self-pay

## 2019-02-23 ENCOUNTER — Encounter (HOSPITAL_COMMUNITY): Payer: Self-pay

## 2019-02-23 ENCOUNTER — Ambulatory Visit (HOSPITAL_COMMUNITY)
Admission: EM | Admit: 2019-02-23 | Discharge: 2019-02-23 | Disposition: A | Discharge status: Home / Self Care | Payer: Medicare PPO | Attending: Internal Medicine | Admitting: Internal Medicine

## 2019-02-23 DIAGNOSIS — K5901 Slow transit constipation: Secondary | ICD-10-CM | POA: Diagnosis not present

## 2019-02-23 MED ORDER — POLYETHYLENE GLYCOL 3350 17 G PO PACK: 17.0000 g | PACK | Freq: Every day | ORAL | 0 refills | Status: DC

## 2019-02-23 NOTE — ED Provider Notes (Signed)
[place]    CSN: [id number] Arrival date & time: 02/23/19  1553     History   Chief Complaint Chief Complaint  Patient presents with  . Blood In Stools    HPI Eric Schultz is a 78 y.o. male a history of gout, chronic pain comes to urgent care with complaints of intermittent diarrhea over the past several months.  Patient is on hydrocodone for chronic pain and is supposed to be on MiraLAX.  Patient does not remember taking MiraLAX on a regular basis.  Patient describes his bowel habits as twice a week typically constipated followed by diarrhea bowel movements.  His diet is mainly fast food and soda.  No fiber intake.  No nausea or vomiting.  No abdominal distention.  Patient denies any weight change.  Stool is brown with black dots in the stool.    HPI  Past Medical History:  Diagnosis Date  . Cellulitis    left lower leg  . Gout   . Hypertension    "borderline"  . Sleep apnea     There are no active problems to display for this patient.   History reviewed. No pertinent surgical history.     Home Medications    Prior to Admission medications   Medication Sig Start Date End Date Taking? Authorizing Provider  fluticasone (FLONASE) 50 MCG/ACT nasal spray Place 1 spray into both nostrils daily. 06/15/18   Orma Flaming, MD  furosemide (LASIX) 20 MG tablet Take 1 tablet (20 mg total) by mouth daily. 08/19/18   Raylene Everts, MD  gabapentin (NEURONTIN) 300 MG capsule Take 1 capsule (300 mg total) by mouth 3 (three) times daily for 30 days. 09/13/18 10/13/18  Curatolo, Adam, DO  HYDROcodone-acetaminophen (NORCO/VICODIN) 5-325 MG tablet Take 1-2 tablets by mouth every 6 (six) hours as needed for severe pain. 11/30/18   Duffy Bruce, MD  ibuprofen (ADVIL,MOTRIN) 800 MG tablet Take 1 tablet (800 mg total) by mouth 3 (three) times daily. 09/24/18   Wynona Luna, MD  indomethacin (INDOCIN) 25 MG capsule Take 1 capsule (25 mg total) by mouth 3 (three)  times daily as needed (for gout). 01/03/19   Vanessa Kick, MD  nystatin (MYCOSTATIN/NYSTOP) powder Apply topically 3 (three) times daily. To groin area/rash for 10 days 11/30/18   Duffy Bruce, MD  polyethylene glycol (MIRALAX) 17 g packet Take 17 g by mouth daily. 02/23/19   , Myrene Galas, MD  polyethylene glycol powder (GLYCOLAX/MIRALAX) powder Take 17 g by mouth daily. 07/14/18   Wieters, Hallie C, PA-C  potassium chloride SA (K-DUR,KLOR-CON) 20 MEQ tablet Take 1 tablet (20 mEq total) by mouth daily. 08/19/18   Raylene Everts, MD    Family History Family History  Problem Relation Age of Onset  . Heart failure Mother   . Heart failure Father     Social History Social History   Tobacco Use  . Smoking status: Never Smoker  . Smokeless tobacco: Never Used  Substance Use Topics  . Alcohol use: No  . Drug use: No     Allergies   Sulfa antibiotics   Review of Systems Review of Systems  Constitutional: Negative.   HENT: Negative.   Cardiovascular: Negative for chest pain.  Gastrointestinal: Positive for constipation and diarrhea. Negative for abdominal distention, abdominal pain, blood in stool, nausea and vomiting.  Genitourinary: Negative.   Musculoskeletal: Negative.   Skin: Negative.      Physical Exam Triage Vital Signs ED Triage Vitals  Enc Vitals Group     BP 02/23/19 1657 (!) 172/95     Pulse Rate 02/23/19 1657 68     Resp 02/23/19 1657 20     Temp 02/23/19 1657 98.5 F (36.9 C)     Temp Source 02/23/19 1657 Oral     SpO2 02/23/19 1657 97 %     Weight --      Height --      Head Circumference --      Peak Flow --      Pain Score 02/23/19 1655 0     Pain Loc --      Pain Edu? --      Excl. in [place]? --    No data found.  Updated Vital Signs BP (!) 172/95 (BP Location: Right Arm)   Pulse 68   Temp 98.5 F (36.9 C) (Oral)   Resp 20   SpO2 97%   Visual Acuity Right Eye Distance:   Left Eye Distance:   Bilateral Distance:    Right Eye  Near:   Left Eye Near:    Bilateral Near:     Physical Exam Cardiovascular:     Rate and Rhythm: Normal rate and regular rhythm.  Pulmonary:     Effort: Pulmonary effort is normal.     Breath sounds: Normal breath sounds.  Abdominal:     General: Bowel sounds are normal.     Palpations: Abdomen is soft.  Musculoskeletal: Normal range of motion.  Skin:    Capillary Refill: Capillary refill takes less than 2 seconds.  Neurological:     General: No focal deficit present.     Mental Status: He is oriented to person, place, and time.      UC Treatments / Results  Labs (all labs ordered are listed, but only abnormal results are displayed) Labs Reviewed - No data to display  EKG   Radiology No results found.  Procedures Procedures (including critical care time)  Medications Ordered in UC Medications - No data to display  Initial Impression / Assessment and Plan / UC Course  I have reviewed the triage vital signs and the nursing notes.  Pertinent labs & imaging results that were available during my care of the patient were reviewed by me and considered in my medical decision making (see chart for details).     1.  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 Final Clinical Impressions(s) / UC Diagnoses   Final diagnoses:  Slow transit constipation   Discharge Instructions   None    ED Prescriptions    None     Controlled Substance Prescriptions [place] Controlled Substance Registry consulted? No   Chase Picket, MD 02/23/19 2016

## 2019-02-23 NOTE — ED Triage Notes (Signed)
Patient presents to Urgent Care with complaints of "little black dots in my stool" and intermittent diarrhea since 4-5 months ago. Patient reports he would like to see a DOCTOR today, stating "P.A.s don't know anything, they just push around pills, I only want to see a real doctor today". Pt also endorses random intermittent naps also for 4-5 months.

## 2019-03-20 ENCOUNTER — Encounter (HOSPITAL_COMMUNITY): Payer: Self-pay

## 2019-03-20 ENCOUNTER — Inpatient Hospital Stay (HOSPITAL_COMMUNITY)
Admission: EM | Admit: 2019-03-20 | Discharge: 2019-03-25 | DRG: 871 | Disposition: A | Discharge status: Home / Self Care | Payer: Medicare PPO | Attending: Family Medicine | Admitting: Family Medicine

## 2019-03-20 ENCOUNTER — Other Ambulatory Visit: Payer: Self-pay

## 2019-03-20 ENCOUNTER — Emergency Department (HOSPITAL_COMMUNITY): Payer: Medicare PPO

## 2019-03-20 DIAGNOSIS — I872 Venous insufficiency (chronic) (peripheral): Secondary | ICD-10-CM

## 2019-03-20 DIAGNOSIS — L280 Lichen simplex chronicus: Secondary | ICD-10-CM | POA: Diagnosis present

## 2019-03-20 DIAGNOSIS — L03116 Cellulitis of left lower limb: Secondary | ICD-10-CM | POA: Diagnosis present

## 2019-03-20 DIAGNOSIS — N179 Acute kidney failure, unspecified: Secondary | ICD-10-CM

## 2019-03-20 DIAGNOSIS — I350 Nonrheumatic aortic (valve) stenosis: Secondary | ICD-10-CM | POA: Diagnosis present

## 2019-03-20 DIAGNOSIS — Z20828 Contact with and (suspected) exposure to other viral communicable diseases: Secondary | ICD-10-CM | POA: Diagnosis present

## 2019-03-20 DIAGNOSIS — K7460 Unspecified cirrhosis of liver: Secondary | ICD-10-CM | POA: Diagnosis present

## 2019-03-20 DIAGNOSIS — I5033 Acute on chronic diastolic (congestive) heart failure: Secondary | ICD-10-CM | POA: Diagnosis present

## 2019-03-20 DIAGNOSIS — E876 Hypokalemia: Secondary | ICD-10-CM

## 2019-03-20 DIAGNOSIS — I110 Hypertensive heart disease with heart failure: Secondary | ICD-10-CM | POA: Diagnosis present

## 2019-03-20 DIAGNOSIS — A4159 Other Gram-negative sepsis: Secondary | ICD-10-CM | POA: Diagnosis not present

## 2019-03-20 DIAGNOSIS — R0902 Hypoxemia: Secondary | ICD-10-CM

## 2019-03-20 DIAGNOSIS — J9691 Respiratory failure, unspecified with hypoxia: Secondary | ICD-10-CM

## 2019-03-20 DIAGNOSIS — J154 Pneumonia due to other streptococci: Secondary | ICD-10-CM | POA: Diagnosis present

## 2019-03-20 DIAGNOSIS — Z791 Long term (current) use of non-steroidal anti-inflammatories (NSAID): Secondary | ICD-10-CM

## 2019-03-20 DIAGNOSIS — J189 Pneumonia, unspecified organism: Secondary | ICD-10-CM | POA: Diagnosis present

## 2019-03-20 DIAGNOSIS — R9389 Abnormal findings on diagnostic imaging of other specified body structures: Secondary | ICD-10-CM

## 2019-03-20 DIAGNOSIS — D696 Thrombocytopenia, unspecified: Secondary | ICD-10-CM | POA: Diagnosis present

## 2019-03-20 DIAGNOSIS — Z8249 Family history of ischemic heart disease and other diseases of the circulatory system: Secondary | ICD-10-CM

## 2019-03-20 DIAGNOSIS — J9621 Acute and chronic respiratory failure with hypoxia: Secondary | ICD-10-CM

## 2019-03-20 DIAGNOSIS — G4733 Obstructive sleep apnea (adult) (pediatric): Secondary | ICD-10-CM | POA: Diagnosis present

## 2019-03-20 DIAGNOSIS — L03115 Cellulitis of right lower limb: Secondary | ICD-10-CM

## 2019-03-20 DIAGNOSIS — Z79899 Other long term (current) drug therapy: Secondary | ICD-10-CM

## 2019-03-20 DIAGNOSIS — M109 Gout, unspecified: Secondary | ICD-10-CM | POA: Diagnosis present

## 2019-03-20 DIAGNOSIS — Z79891 Long term (current) use of opiate analgesic: Secondary | ICD-10-CM

## 2019-03-20 DIAGNOSIS — Z882 Allergy status to sulfonamides status: Secondary | ICD-10-CM

## 2019-03-20 LAB — COMPREHENSIVE METABOLIC PANEL
ALT: 26 U/L (ref 0–44)
AST: 29 U/L (ref 15–41)
Albumin: 3.6 g/dL (ref 3.5–5.0)
Alkaline Phosphatase: 92 U/L (ref 38–126)
Anion gap: 10 (ref 5–15)
BUN: 13 mg/dL (ref 8–23)
CO2: 22 mmol/L (ref 22–32)
Calcium: 8.6 mg/dL — ABNORMAL LOW (ref 8.9–10.3)
Chloride: 105 mmol/L (ref 98–111)
Creatinine, Ser: 1.28 mg/dL — ABNORMAL HIGH (ref 0.61–1.24)
GFR calc Af Amer: >60 mL/min (ref >60)
GFR calc non Af Amer: 54 mL/min — ABNORMAL LOW (ref >60)
Glucose, Bld: 117 mg/dL — ABNORMAL HIGH (ref 70–99)
Potassium: 3.4 mmol/L — ABNORMAL LOW (ref 3.5–5.1)
Sodium: 137 mmol/L (ref 135–145)
Total Bilirubin: 2.2 mg/dL — ABNORMAL HIGH (ref 0.3–1.2)
Total Protein: 8.2 g/dL — ABNORMAL HIGH (ref 6.5–8.1)

## 2019-03-20 LAB — CBC WITH DIFFERENTIAL/PLATELET
Abs Immature Granulocytes: 0.14 10*3/uL — ABNORMAL HIGH (ref 0.00–0.07)
Basophils Absolute: 0 10*3/uL (ref 0.0–0.1)
Basophils Relative: 0 %
Eosinophils Absolute: 0 10*3/uL (ref 0.0–0.5)
Eosinophils Relative: 0 %
HCT: 43.2 % (ref 39.0–52.0)
Hemoglobin: 14.2 g/dL (ref 13.0–17.0)
Immature Granulocytes: 1 %
Lymphocytes Relative: 5 %
Lymphs Abs: 0.7 10*3/uL (ref 0.7–4.0)
MCH: 32.7 pg (ref 26.0–34.0)
MCHC: 32.9 g/dL (ref 30.0–36.0)
MCV: 99.5 fL (ref 80.0–100.0)
Monocytes Absolute: 0.5 10*3/uL (ref 0.1–1.0)
Monocytes Relative: 4 %
Neutro Abs: 12 10*3/uL — ABNORMAL HIGH (ref 1.7–7.7)
Neutrophils Relative %: 90 %
Platelets: 84 10*3/uL — ABNORMAL LOW (ref 150–400)
RBC: 4.34 MIL/uL (ref 4.22–5.81)
RDW: 13.7 % (ref 11.5–15.5)
WBC: 13.3 10*3/uL — ABNORMAL HIGH (ref 4.0–10.5)
nRBC: 0 % (ref 0.0–0.2)

## 2019-03-20 LAB — URINALYSIS, ROUTINE W REFLEX MICROSCOPIC
Bacteria, UA: NONE SEEN
Bilirubin Urine: NEGATIVE
Glucose, UA: NEGATIVE mg/dL
Ketones, ur: NEGATIVE mg/dL
Leukocytes,Ua: NEGATIVE
Nitrite: NEGATIVE
Protein, ur: NEGATIVE mg/dL
Specific Gravity, Urine: 1.02 (ref 1.005–1.030)
pH: 5 (ref 5.0–8.0)

## 2019-03-20 LAB — SARS CORONAVIRUS 2 BY RT PCR (HOSPITAL ORDER, PERFORMED IN ~~LOC~~ HOSPITAL LAB): SARS Coronavirus 2: NEGATIVE

## 2019-03-20 LAB — LACTIC ACID, PLASMA: Lactic Acid, Venous: 3.2 mmol/L (ref 0.5–1.9)

## 2019-03-20 MED ORDER — SODIUM CHLORIDE 0.9 % IV BOLUS
1000.0000 mL | Freq: Once | INTRAVENOUS | Status: AC
  Administered 2019-03-20: 1000 mL via INTRAVENOUS

## 2019-03-20 MED ORDER — VANCOMYCIN HCL 10 G IV SOLR
2000.0000 mg | Freq: Once | INTRAVENOUS | Status: AC
  Administered 2019-03-20: 2000 mg via INTRAVENOUS
  Filled 2019-03-20: qty 2000

## 2019-03-20 MED ORDER — ACETAMINOPHEN 325 MG PO TABS
650.0000 mg | ORAL_TABLET | Freq: Once | ORAL | Status: AC
  Administered 2019-03-20: 650 mg via ORAL
  Filled 2019-03-20: qty 2

## 2019-03-20 MED ORDER — SODIUM CHLORIDE 0.9 % IV SOLN
1.0000 g | Freq: Once | INTRAVENOUS | Status: AC
  Administered 2019-03-20: 1 g via INTRAVENOUS
  Filled 2019-03-20: qty 1

## 2019-03-20 NOTE — Progress Notes (Deleted)
A consult was received from an ED physician for cefepime and vancomycin per pharmacy dosing.  The patient's profile has been reviewed for ht/wt/allergies/indication/available labs.   A one time order has been placed for Cefepime 1 Gm and Vancomycin 2 Gm.  Further antibiotics/pharmacy consults should be ordered by admitting physician if indicated.                       Thank you, Dorrene German 03/20/2019  10:50 PM

## 2019-03-20 NOTE — ED Notes (Signed)
Date and time results received: 03/20/19 10:37 PM  (use smartphrase ".now" to insert current time)  Test: Lactic Acid Critical Value: 3.2  Name of Provider Notified: Dr.Kohut  Orders Received? Or Actions Taken?:none

## 2019-03-20 NOTE — ED Triage Notes (Signed)
Pt arrived via [facility] after being called out for weakness, shortness of breath, and dizziness. Pt has bilateral cellulitis, more significant on the left leg. Pt febrile upon arrival.

## 2019-03-20 NOTE — ED Notes (Signed)
Pt pulled all cords off stating that he does not like wearing them, this nurse explained the importance and pt agreed to put them back on. Pt would not reapply non-rebreather mask, placed on 4L [place]- oxygen saturation 92

## 2019-03-20 NOTE — ED Provider Notes (Signed)
[facility] DEPT Provider Note   CSN: [id number] Arrival date & time: 03/20/19  2108     History   Chief Complaint Chief Complaint  Patient presents with  . Shortness of Breath  . Fever    HPI Eric Schultz is a 78 y.o. male.     HPI   78 year old male with generalized weakness, cough & shortness of breath.  Symptom onset 2 days ago.  Progressively worsening since then.  Nonproductive cough.  Subjective fevers.  Feels generally weak.  Poor appetite.  No sick contacts that he is aware of.  Past Medical History:  Diagnosis Date  . Cellulitis    left lower leg  . Gout   . Hypertension    "borderline"  . Sleep apnea     There are no active problems to display for this patient.   History reviewed. No pertinent surgical history.      Home Medications    Prior to Admission medications   Medication Sig Start Date End Date Taking? Authorizing Provider  fluticasone (FLONASE) 50 MCG/ACT nasal spray Place 1 spray into both nostrils daily. 06/15/18   Orma Flaming, MD  furosemide (LASIX) 20 MG tablet Take 1 tablet (20 mg total) by mouth daily. 08/19/18   Raylene Everts, MD  gabapentin (NEURONTIN) 300 MG capsule Take 1 capsule (300 mg total) by mouth 3 (three) times daily for 30 days. 09/13/18 10/13/18  Curatolo, Adam, DO  HYDROcodone-acetaminophen (NORCO/VICODIN) 5-325 MG tablet Take 1-2 tablets by mouth every 6 (six) hours as needed for severe pain. 11/30/18   Duffy Bruce, MD  ibuprofen (ADVIL,MOTRIN) 800 MG tablet Take 1 tablet (800 mg total) by mouth 3 (three) times daily. 09/24/18   Wynona Luna, MD  indomethacin (INDOCIN) 25 MG capsule Take 1 capsule (25 mg total) by mouth 3 (three) times daily as needed (for gout). 01/03/19   Vanessa Kick, MD  nystatin (MYCOSTATIN/NYSTOP) powder Apply topically 3 (three) times daily. To groin area/rash for 10 days 11/30/18   Duffy Bruce, MD  polyethylene glycol (MIRALAX) 17 g packet Take 17  g by mouth daily. 02/23/19   Lamptey, Myrene Galas, MD  polyethylene glycol powder (GLYCOLAX/MIRALAX) powder Take 17 g by mouth daily. 07/14/18   Wieters, Hallie C, PA-C  potassium chloride SA (K-DUR,KLOR-CON) 20 MEQ tablet Take 1 tablet (20 mEq total) by mouth daily. 08/19/18   Raylene Everts, MD    Family History Family History  Problem Relation Age of Onset  . Heart failure Mother   . Heart failure Father     Social History Social History   Tobacco Use  . Smoking status: Never Smoker  . Smokeless tobacco: Never Used  Substance Use Topics  . Alcohol use: No  . Drug use: No     Allergies   Sulfa antibiotics   Review of Systems Review of Systems  All systems reviewed and negative, other than as noted in HPI.  Physical Exam Updated Vital Signs BP 139/77   Pulse (!) 110   Temp (!) 100.5 F (38.1 C) (Oral)   Resp (!) 34   Ht 5\' 11"  (1.803 m)   Wt 127 kg   SpO2 92%   BMI 39.05 kg/m   Physical Exam Vitals signs and nursing note reviewed.  Constitutional:      Appearance: He is obese.     Comments: Peers tired and somewhat chronically ill-appearing, but nontoxic.  HENT:     Head: Normocephalic and atraumatic.  Eyes:  General:        Right eye: No discharge.        Left eye: No discharge.     Conjunctiva/sclera: Conjunctivae normal.  Neck:     Musculoskeletal: Neck supple.  Cardiovascular:     Rate and Rhythm: Regular rhythm. Tachycardia present.     Heart sounds: Normal heart sounds. No murmur. No friction rub. No gallop.   Pulmonary:     Effort: Pulmonary effort is normal. No respiratory distress.     Breath sounds: Normal breath sounds.  Abdominal:     General: There is no distension.     Palpations: Abdomen is soft.     Tenderness: There is no abdominal tenderness.  Musculoskeletal:        General: No tenderness.     Comments: Bilateral lower extremity edema with venous stasis changes bilaterally.  There may be some superimposed cellulitis of the  left foot.  Skin:    General: Skin is warm and dry.  Neurological:     Mental Status: He is alert.  Psychiatric:        Behavior: Behavior normal.        Thought Content: Thought content normal.      ED Treatments / Results  Labs (all labs ordered are listed, but only abnormal results are displayed) Labs Reviewed  URINE CULTURE - Abnormal; Notable for the following components:      Result Value   Culture   (*)    Value: <10,000 COLONIES/mL INSIGNIFICANT GROWTH Performed at [facility], [address], [place], [place] [address]    All other components within normal limits  MRSA PCR SCREENING - Abnormal; Notable for the following components:   MRSA by PCR POSITIVE (*)    All other components within normal limits  CBC WITH DIFFERENTIAL/PLATELET - Abnormal; Notable for the following components:   WBC 13.3 (*)    Platelets 84 (*)    Neutro Abs 12.0 (*)    Abs Immature Granulocytes 0.14 (*)    All other components within normal limits  COMPREHENSIVE METABOLIC PANEL - Abnormal; Notable for the following components:   Potassium 3.4 (*)    Glucose, Bld 117 (*)    Creatinine, Ser 1.28 (*)    Calcium 8.6 (*)    Total Protein 8.2 (*)    Total Bilirubin 2.2 (*)    GFR calc non Af Amer 54 (*)    All other components within normal limits  LACTIC ACID, PLASMA - Abnormal; Notable for the following components:   Lactic Acid, Venous 3.2 (*)    All other components within normal limits  LACTIC ACID, PLASMA - Abnormal; Notable for the following components:   Lactic Acid, Venous 2.4 (*)    All other components within normal limits  URINALYSIS, ROUTINE W REFLEX MICROSCOPIC - Abnormal; Notable for the following components:   Hgb urine dipstick SMALL (*)    All other components within normal limits  BRAIN NATRIURETIC PEPTIDE - Abnormal; Notable for the following components:   B Natriuretic Peptide 1,486.7 (*)    All other components within normal limits  MAGNESIUM - Abnormal;  Notable for the following components:   Magnesium 1.6 (*)    All other components within normal limits  BASIC METABOLIC PANEL - Abnormal; Notable for the following components:   CO2 20 (*)    Glucose, Bld 119 (*)    Calcium 8.2 (*)    All other components within normal limits  CBC - Abnormal;  Notable for the following components:   WBC 11.4 (*)    MCV 100.7 (*)    Platelets 75 (*)    All other components within normal limits  BLOOD GAS, ARTERIAL - Abnormal; Notable for the following components:   pO2, Arterial 209 (*)    All other components within normal limits  CBC WITH DIFFERENTIAL/PLATELET - Abnormal; Notable for the following components:   RBC 3.64 (*)    Hemoglobin 12.4 (*)    HCT 36.4 (*)    MCH 34.1 (*)    Platelets 71 (*)    Abs Immature Granulocytes 0.11 (*)    All other components within normal limits  BASIC METABOLIC PANEL - Abnormal; Notable for the following components:   Potassium 3.2 (*)    Calcium 8.2 (*)    All other components within normal limits  CBC WITH DIFFERENTIAL/PLATELET - Abnormal; Notable for the following components:   RBC 3.75 (*)    Hemoglobin 12.6 (*)    HCT 36.9 (*)    Platelets 74 (*)    All other components within normal limits  BASIC METABOLIC PANEL - Abnormal; Notable for the following components:   Potassium 3.4 (*)    Calcium 7.9 (*)    All other components within normal limits  CULTURE, BLOOD (ROUTINE X 2)  CULTURE, BLOOD (ROUTINE X 2)  SARS CORONAVIRUS 2 (HOSPITAL ORDER, [facility])  BLOOD CULTURE ID PANEL (REFLEXED)  HIV ANTIBODY (ROUTINE TESTING W REFLEX)  LEGIONELLA PNEUMOPHILA SEROGP 1 UR AG  STREP PNEUMONIAE URINARY ANTIGEN  PROCALCITONIN    EKG None  Radiology Dg Chest Portable 1 View  Result Date: 03/20/2019 CLINICAL DATA:  Fever EXAM: PORTABLE CHEST 1 VIEW COMPARISON:  07/10/2017 FINDINGS: Cardiomegaly with central vascular congestion. Low lung volumes. Patchy opacity at the left base.  No pneumothorax. IMPRESSION: 1. Low lung volumes. Patchy atelectasis or mild infiltrate at the left base 2. Cardiomegaly with mild central congestion Electronically Signed   By: Donavan Foil M.D.   On: 03/20/2019 22:19    Procedures Procedures (including critical care time)  Medications Ordered in ED Medications  ceFEPIme (MAXIPIME) 1 g in sodium chloride 0.9 % 100 mL IVPB (1 g Intravenous New Bag/Given 03/20/19 2314)  vancomycin (VANCOCIN) 2,000 mg in sodium chloride 0.9 % 500 mL IVPB (2,000 mg Intravenous New Bag/Given 03/20/19 2313)  acetaminophen (TYLENOL) tablet 650 mg (650 mg Oral Given 03/20/19 2153)  sodium chloride 0.9 % bolus 1,000 mL (1,000 mLs Intravenous New Bag/Given 03/20/19 2255)     Initial Impression / Assessment and Plan / ED Course  I have reviewed the triage vital signs and the nursing notes.  Pertinent labs & imaging results that were available during my care of the patient were reviewed by me and considered in my medical decision making (see chart for details).   78 year old male with fever and generalized weakness.  Chest x-ray with questionable infiltrate.  Given his complaint of cough and shortness of breath as well as an oxygen requirement, pneumonia certainly consideration.  He is COVID negative.  Questionable cellulitis of left lower extremity superimposed on chronic stasis changes. Empirically received cefepime/vanc which should cover for both pulmonary source and cellulitis. With oxygen requirement, will admit.   Final Clinical Impressions(s) / ED Diagnoses   Final diagnoses:  Community acquired pneumonia, unspecified laterality  Cellulitis of left lower extremity    ED Discharge Orders    None       Virgel Manifold, MD 03/23/19 1756

## 2019-03-21 ENCOUNTER — Inpatient Hospital Stay (HOSPITAL_COMMUNITY): Payer: Medicare PPO

## 2019-03-21 DIAGNOSIS — M7989 Other specified soft tissue disorders: Secondary | ICD-10-CM | POA: Diagnosis not present

## 2019-03-21 DIAGNOSIS — J189 Pneumonia, unspecified organism: Secondary | ICD-10-CM | POA: Diagnosis not present

## 2019-03-21 DIAGNOSIS — Z79891 Long term (current) use of opiate analgesic: Secondary | ICD-10-CM | POA: Diagnosis not present

## 2019-03-21 DIAGNOSIS — Z882 Allergy status to sulfonamides status: Secondary | ICD-10-CM | POA: Diagnosis not present

## 2019-03-21 DIAGNOSIS — R0902 Hypoxemia: Secondary | ICD-10-CM | POA: Diagnosis present

## 2019-03-21 DIAGNOSIS — M109 Gout, unspecified: Secondary | ICD-10-CM | POA: Diagnosis present

## 2019-03-21 DIAGNOSIS — L280 Lichen simplex chronicus: Secondary | ICD-10-CM | POA: Diagnosis present

## 2019-03-21 DIAGNOSIS — Z8249 Family history of ischemic heart disease and other diseases of the circulatory system: Secondary | ICD-10-CM | POA: Diagnosis not present

## 2019-03-21 DIAGNOSIS — Z79899 Other long term (current) drug therapy: Secondary | ICD-10-CM | POA: Diagnosis not present

## 2019-03-21 DIAGNOSIS — I872 Venous insufficiency (chronic) (peripheral): Secondary | ICD-10-CM | POA: Diagnosis present

## 2019-03-21 DIAGNOSIS — A4159 Other Gram-negative sepsis: Secondary | ICD-10-CM | POA: Diagnosis present

## 2019-03-21 DIAGNOSIS — L03116 Cellulitis of left lower limb: Secondary | ICD-10-CM

## 2019-03-21 DIAGNOSIS — J9621 Acute and chronic respiratory failure with hypoxia: Secondary | ICD-10-CM | POA: Diagnosis present

## 2019-03-21 DIAGNOSIS — G4733 Obstructive sleep apnea (adult) (pediatric): Secondary | ICD-10-CM | POA: Diagnosis present

## 2019-03-21 DIAGNOSIS — K7460 Unspecified cirrhosis of liver: Secondary | ICD-10-CM | POA: Diagnosis present

## 2019-03-21 DIAGNOSIS — L03115 Cellulitis of right lower limb: Secondary | ICD-10-CM

## 2019-03-21 DIAGNOSIS — B9689 Other specified bacterial agents as the cause of diseases classified elsewhere: Secondary | ICD-10-CM | POA: Diagnosis not present

## 2019-03-21 DIAGNOSIS — I361 Nonrheumatic tricuspid (valve) insufficiency: Secondary | ICD-10-CM

## 2019-03-21 DIAGNOSIS — I350 Nonrheumatic aortic (valve) stenosis: Secondary | ICD-10-CM | POA: Diagnosis present

## 2019-03-21 DIAGNOSIS — I5033 Acute on chronic diastolic (congestive) heart failure: Secondary | ICD-10-CM | POA: Diagnosis present

## 2019-03-21 DIAGNOSIS — A4189 Other specified sepsis: Secondary | ICD-10-CM | POA: Diagnosis not present

## 2019-03-21 DIAGNOSIS — D72829 Elevated white blood cell count, unspecified: Secondary | ICD-10-CM | POA: Diagnosis not present

## 2019-03-21 DIAGNOSIS — I110 Hypertensive heart disease with heart failure: Secondary | ICD-10-CM | POA: Diagnosis present

## 2019-03-21 DIAGNOSIS — E876 Hypokalemia: Secondary | ICD-10-CM | POA: Diagnosis present

## 2019-03-21 DIAGNOSIS — Z791 Long term (current) use of non-steroidal anti-inflammatories (NSAID): Secondary | ICD-10-CM | POA: Diagnosis not present

## 2019-03-21 DIAGNOSIS — D696 Thrombocytopenia, unspecified: Secondary | ICD-10-CM | POA: Diagnosis present

## 2019-03-21 DIAGNOSIS — N179 Acute kidney failure, unspecified: Secondary | ICD-10-CM

## 2019-03-21 DIAGNOSIS — R7881 Bacteremia: Secondary | ICD-10-CM | POA: Diagnosis not present

## 2019-03-21 DIAGNOSIS — Z20828 Contact with and (suspected) exposure to other viral communicable diseases: Secondary | ICD-10-CM | POA: Diagnosis present

## 2019-03-21 DIAGNOSIS — J154 Pneumonia due to other streptococci: Secondary | ICD-10-CM | POA: Diagnosis present

## 2019-03-21 DIAGNOSIS — R509 Fever, unspecified: Secondary | ICD-10-CM | POA: Diagnosis not present

## 2019-03-21 LAB — CBC
HCT: 42.5 % (ref 39.0–52.0)
Hemoglobin: 13.9 g/dL (ref 13.0–17.0)
MCH: 32.9 pg (ref 26.0–34.0)
MCHC: 32.7 g/dL (ref 30.0–36.0)
MCV: 100.7 fL — ABNORMAL HIGH (ref 80.0–100.0)
Platelets: 75 10*3/uL — ABNORMAL LOW (ref 150–400)
RBC: 4.22 MIL/uL (ref 4.22–5.81)
RDW: 13.9 % (ref 11.5–15.5)
WBC: 11.4 10*3/uL — ABNORMAL HIGH (ref 4.0–10.5)
nRBC: 0 % (ref 0.0–0.2)

## 2019-03-21 LAB — PROCALCITONIN: Procalcitonin: 4.24 ng/mL

## 2019-03-21 LAB — BASIC METABOLIC PANEL
Anion gap: 12 (ref 5–15)
BUN: 14 mg/dL (ref 8–23)
CO2: 20 mmol/L — ABNORMAL LOW (ref 22–32)
Calcium: 8.2 mg/dL — ABNORMAL LOW (ref 8.9–10.3)
Chloride: 107 mmol/L (ref 98–111)
Creatinine, Ser: 1.14 mg/dL (ref 0.61–1.24)
GFR calc Af Amer: >60 mL/min (ref >60)
GFR calc non Af Amer: >60 mL/min (ref >60)
Glucose, Bld: 119 mg/dL — ABNORMAL HIGH (ref 70–99)
Potassium: 3.5 mmol/L (ref 3.5–5.1)
Sodium: 139 mmol/L (ref 135–145)

## 2019-03-21 LAB — BLOOD GAS, ARTERIAL
Acid-base deficit: 1.2 mmol/L (ref 0.0–2.0)
Bicarbonate: 22.1 mmol/L (ref 20.0–28.0)
Delivery systems: POSITIVE
Drawn by: 336831
Expiratory PAP: 5
FIO2: 90
Inspiratory PAP: 10
O2 Saturation: 99.5 %
Patient temperature: 98.6
RATE: 12 resp/min
pCO2 arterial: 34.1 mmHg (ref 32.0–48.0)
pH, Arterial: 7.427 (ref 7.350–7.450)
pO2, Arterial: 209 mmHg — ABNORMAL HIGH (ref 83.0–108.0)

## 2019-03-21 LAB — STREP PNEUMONIAE URINARY ANTIGEN: Strep Pneumo Urinary Antigen: NEGATIVE

## 2019-03-21 LAB — ECHOCARDIOGRAM COMPLETE
Height: 71 in
Weight: 4832.48 oz

## 2019-03-21 LAB — HIV ANTIBODY (ROUTINE TESTING W REFLEX): HIV Screen 4th Generation wRfx: NONREACTIVE

## 2019-03-21 LAB — LACTIC ACID, PLASMA: Lactic Acid, Venous: 2.4 mmol/L (ref 0.5–1.9)

## 2019-03-21 LAB — MRSA PCR SCREENING: MRSA by PCR: POSITIVE — AB

## 2019-03-21 LAB — MAGNESIUM: Magnesium: 1.6 mg/dL — ABNORMAL LOW (ref 1.7–2.4)

## 2019-03-21 LAB — BRAIN NATRIURETIC PEPTIDE: B Natriuretic Peptide: 1486.7 pg/mL — ABNORMAL HIGH (ref 0.0–100.0)

## 2019-03-21 MED ORDER — SODIUM CHLORIDE 0.9 % IV SOLN
500.0000 mg | Freq: Every day | INTRAVENOUS | Status: DC
  Administered 2019-03-21: 500 mg via INTRAVENOUS
  Filled 2019-03-21: qty 500

## 2019-03-21 MED ORDER — MUPIROCIN 2 % EX OINT
1.0000 "application " | TOPICAL_OINTMENT | Freq: Two times a day (BID) | CUTANEOUS | Status: AC
  Administered 2019-03-21 – 2019-03-25 (×9): 1 via NASAL
  Filled 2019-03-21: qty 22

## 2019-03-21 MED ORDER — ENOXAPARIN SODIUM 40 MG/0.4ML ~~LOC~~ SOLN: 40.0000 mg | SUBCUTANEOUS | Status: DC

## 2019-03-21 MED ORDER — CHLORHEXIDINE GLUCONATE CLOTH 2 % EX PADS
6.0000 | MEDICATED_PAD | Freq: Every day | CUTANEOUS | Status: DC
  Administered 2019-03-21: 6 via TOPICAL

## 2019-03-21 MED ORDER — PERFLUTREN LIPID MICROSPHERE
1.0000 mL | INTRAVENOUS | Status: AC | PRN
  Administered 2019-03-21: 2 mL via INTRAVENOUS
  Filled 2019-03-21: qty 10

## 2019-03-21 MED ORDER — POTASSIUM CHLORIDE 20 MEQ/15ML (10%) PO SOLN
40.0000 meq | Freq: Once | ORAL | Status: AC
  Administered 2019-03-21: 40 meq via ORAL
  Filled 2019-03-21: qty 30

## 2019-03-21 MED ORDER — SODIUM CHLORIDE 0.9 % IV SOLN
2.0000 g | Freq: Every day | INTRAVENOUS | Status: AC
  Administered 2019-03-21 – 2019-03-25 (×5): 2 g via INTRAVENOUS
  Filled 2019-03-21: qty 2
  Filled 2019-03-21: qty 20
  Filled 2019-03-21 (×2): qty 2
  Filled 2019-03-21 (×2): qty 20

## 2019-03-21 MED ORDER — CHLORHEXIDINE GLUCONATE CLOTH 2 % EX PADS
6.0000 | MEDICATED_PAD | Freq: Every day | CUTANEOUS | Status: DC
  Administered 2019-03-23 – 2019-03-24 (×2): 6 via TOPICAL

## 2019-03-21 MED ORDER — ACETAMINOPHEN 325 MG PO TABS
650.0000 mg | ORAL_TABLET | ORAL | Status: DC | PRN
  Administered 2019-03-21: 650 mg via ORAL
  Filled 2019-03-21: qty 2

## 2019-03-21 MED ORDER — CHLORHEXIDINE GLUCONATE 0.12 % MT SOLN
15.0000 mL | Freq: Two times a day (BID) | OROMUCOSAL | Status: DC
  Administered 2019-03-21 – 2019-03-24 (×7): 15 mL via OROMUCOSAL
  Filled 2019-03-21 (×7): qty 15

## 2019-03-21 MED ORDER — AZITHROMYCIN 250 MG PO TABS
500.0000 mg | ORAL_TABLET | Freq: Every day | ORAL | Status: AC
  Administered 2019-03-22 – 2019-03-25 (×4): 500 mg via ORAL
  Filled 2019-03-21 (×4): qty 2

## 2019-03-21 MED ORDER — ORAL CARE MOUTH RINSE
15.0000 mL | Freq: Two times a day (BID) | OROMUCOSAL | Status: DC
  Administered 2019-03-21 (×2): 15 mL via OROMUCOSAL

## 2019-03-21 NOTE — Progress Notes (Signed)
Patient vomited after taking PO medication.  Patient was placed back on PRB mask and if no further vomiting occurs after 30 minutes will attempt BIPAP again at that time.

## 2019-03-21 NOTE — Progress Notes (Signed)
PHARMACIST - PHYSICIAN COMMUNICATION  CONCERNING: Antibiotic IV to Oral Route Change Policy  RECOMMENDATION: This patient is receiving azithromycin by the intravenous route.  Based on criteria approved by the Pharmacy and Therapeutics Committee, the antibiotic(s) is/are being converted to the equivalent oral dose form(s).   DESCRIPTION: These criteria include:  Patient being treated for a respiratory tract infection, urinary tract infection, cellulitis or clostridium difficile associated diarrhea if on metronidazole  The patient is not neutropenic and does not exhibit a GI malabsorption state  The patient is eating (either orally or via tube) and/or has been taking other orally administered medications for a least 24 hours  The patient is improving clinically and has a Tmax <= 100.5  If you have questions about this conversion, please contact the Pharmacy Department  []   [phone] )  Eric Schultz []   [phone] )  [facility] []   [phone] )  [facility] []   [phone] )  [facility] [x]   [phone] )  [place], [place].D [phone] 03/21/2019 11:48 AM

## 2019-03-21 NOTE — ED Notes (Signed)
Respiratory called for possible need of Bipap, will meet her upstairs in pts admitting room.

## 2019-03-21 NOTE — Progress Notes (Signed)
PROGRESS NOTE    Eric Schultz  XBM:[id number] DOB: [birth year] DOA: 03/20/2019 [facility]   Brief Narrative:  Per admitting history and physical Eric Schultz is a 78 y.o. male with medical history significant for hypertension, gout, thrombocytopenia, OSA, chronic venous stasis dermatitis of both lower extremities who presents to the ED for evaluation of shortness of breath.  Patient states he had new onset shortness of breath and cough productive of scant yellow sputum, generalized weakness beginning 2 days ago.  He has chronic swelling and dermatitis changes in both of his legs which he says has been going on for many years.  He has had subjective fevers and poor appetite.  He was with his brother and has no sick contacts that he is aware of.  He denies any recent travel.  He states he does not take any medications regularly.  He denies any chest pain, palpitations, abdominal pain, dysuria, diarrhea.   Assessment & Plan:   Principal Problem:   Acute on chronic respiratory failure with hypoxia (HCC) Active Problems:   Hypokalemia   Community acquired pneumonia   Cellulitis of foot, left   Chronic venous stasis dermatitis of both lower extremities   AKI (acute kidney injury) ([place])   Sepsis with acute respiratory failure with hypoxia: As previously noted, chest x-ray with possible left sided pneumonia.  Patient presenting with shortness of breath, cough productive of scant yellow sputum, leukocytosis, and fever.  Will cont to treat as community acquired pneumonia.  There is also suspicion for underlying congestive heart failure-echo pending. -Continue IV ceftriaxone and azithromycin -Follow up  blood cultures, strep pneumonia and Legionella urine antigens -Continue supplemental oxygen as needed and wean off as able, use BiPAP as needed-abg this AM. 7.42 / 34CO2 / 209O / 22BICARB -Obtain 2 view chest x-ray in a.m -BNP elevated,   Cellulitis of left foot with  chronic venous stasis dermatitis of both lower extremities: -Continue IV ceftriaxone -checked doppler, neg dvt  Acute kidney injury: Suspect due to sepsis from above.  Patient was given 1 L normal saline in ED.  agree with holding fluids given pulm edema  Hypertension: Patient states he is not taking any antihypertensives.  BP currently stable on admission.  Continue to monitor.  Hypokalemia: Oral repletion ordered.  Thrombocytopenia: Chronic and appears near baseline.  Unclear if this is been worked up in the past.  No active bleeding noted.  Hold pharmacologic VTE prophylaxis.  OSA: Not using CPAP at home.  Continue supplemental O2 as needed.  DVT prophylaxis: SCD/Compression stockings  Code Status:     Code Status Orders  (From admission, onward)         Start     Ordered   03/21/19 0100  Full code  Continuous     03/21/19 0102        Code Status History    Date Active Date Inactive Code Status Order ID Comments User Context   02/09/2014 2332 02/12/2014 1314 Full Code [id number]  [name], MD Inpatient   Advance Care Planning Activity     Family Communication: none today  Disposition Plan:   Patient will remain inpatient for continued treatment IV antibiotics, respiratory support with BiPAP, daily labs, and frequent nurse interventions.  Without these treatments patient risk of severe life-threatening deterioration Consults called: None Admission status: Inpatient   Consultants:   None  Procedures:  Dg Chest Port 1 View  Result Date: 03/21/2019 CLINICAL DATA:  Respiratory failure with hypoxia. EXAM:  PORTABLE CHEST 1 VIEW COMPARISON:  03/20/2019; 07/10/2017; CT abdomen pelvis - 11/30/2018 FINDINGS: Grossly unchanged enlarged cardiac silhouette and mediastinal contours with atherosclerotic plaque within thoracic aorta. There is persistent thickening the right paratracheal stripe presumably secondary prominent vasculature. The pulmonary vasculature remains  indistinct with cephalization of flow. Suspected development of small bilateral effusions with associated worsening bibasilar heterogeneous/consolidative opacities. No pneumothorax. No acute osseous abnormalities. IMPRESSION: Cardiomegaly with findings suggestive of pulmonary edema with suspected trace bilateral effusions and worsening bibasilar opacities, atelectasis versus infiltrate. Further evaluation with a PA and lateral chest radiograph may be obtained as clinically indicated. Electronically Signed   By: Sandi Mariscal M.D.   On: 03/21/2019 07:36   Dg Chest Portable 1 View  Result Date: 03/20/2019 CLINICAL DATA:  Fever EXAM: PORTABLE CHEST 1 VIEW COMPARISON:  07/10/2017 FINDINGS: Cardiomegaly with central vascular congestion. Low lung volumes. Patchy opacity at the left base. No pneumothorax. IMPRESSION: 1. Low lung volumes. Patchy atelectasis or mild infiltrate at the left base 2. Cardiomegaly with mild central congestion Electronically Signed   By: Donavan Foil M.D.   On: 03/20/2019 22:19   Vas [place] Lower Extremity Venous (dvt)  Result Date: 03/21/2019  Lower Venous Study Indications: Swelling.  Risk Factors: None identified. Limitations: Body habitus, poor ultrasound/tissue interface and skin changes. Comparison Study: No prior studies. Performing Technologist: Oliver Hum RVT  Examination Guidelines: A complete evaluation includes B-mode imaging, spectral Doppler, color Doppler, and power Doppler as needed of all accessible portions of each vessel. Bilateral testing is considered an integral part of a complete examination. Limited examinations for reoccurring indications may be performed as noted.  +-----+---------------+---------+-----------+----------+-------+  RIGHT Compressibility Phasicity Spontaneity Properties Summary  +-----+---------------+---------+-----------+----------+-------+  CFV   Full            Yes       Yes                              +-----+---------------+---------+-----------+----------+-------+   +---------+---------------+---------+-----------+----------+-------+  LEFT      Compressibility Phasicity Spontaneity Properties Summary  +---------+---------------+---------+-----------+----------+-------+  CFV       Full            Yes       Yes                             +---------+---------------+---------+-----------+----------+-------+  SFJ       Full                                                      +---------+---------------+---------+-----------+----------+-------+  FV Prox   Full                                                      +---------+---------------+---------+-----------+----------+-------+  FV Mid    Full                                                      +---------+---------------+---------+-----------+----------+-------+  FV Distal Full                                                      +---------+---------------+---------+-----------+----------+-------+  PFV       Full                                                      +---------+---------------+---------+-----------+----------+-------+  POP       Full            Yes       Yes                             +---------+---------------+---------+-----------+----------+-------+  PTV       Full                                                      +---------+---------------+---------+-----------+----------+-------+  PERO      Full                                                      +---------+---------------+---------+-----------+----------+-------+     Summary: Right: No evidence of common femoral vein obstruction. Left: There is no evidence of deep vein thrombosis in the lower extremity. No cystic structure found in the popliteal fossa.  *See table(s) above for measurements and observations.    Preliminary      Antimicrobials:   Azithromycin and ceftriaxone > 7/26 day 2    Subjective: Patient's somewhat lethargic this morning, ABG checked not hypercarbic He is  arousable. No acute events  Objective: Vitals:   03/21/19 0900 03/21/19 1000 03/21/19 1100 03/21/19 1114  BP: 108/63 104/61 (!) 121/58   Pulse: (!) 58 (!) 58 61 69  Resp: (!) 23 (!) 24 20 (!) 21  Temp:      TempSrc:      SpO2: 99% 98% 99% 98%  Weight:      Height:        Intake/Output Summary (Last 24 hours) at 03/21/2019 1128 Last data filed at 03/21/2019 0600 Gross per 24 hour  Intake 1486.4 ml  Output --  Net 1486.4 ml   Filed Weights   03/20/19 2116 03/21/19 0429  Weight: 127 kg (!) 137 kg    Examination:  General exam: lethargic but arousable,  Respiratory system: mild rhonchi bilat, trace wheezing, transmitted vent sounds Cardiovascular system: S1 & S2 heard, RRR. No JVD, murmurs, rubs, gallops or clicks. No pedal edema. Gastrointestinal system: Abdomen is nondistended, soft and nontender. No organomegaly or masses felt. Normal bowel sounds heard. Central nervous system: arouabel,. No focal neurological deficits. Extremities: Symmetric 5 x 5 strength, WWP. Skin: No rashes, lesions or ulcers Psychiatry: Judgement and insight IMPAIRED. Mood & affect affect    Data Reviewed: I have personally reviewed following labs and imaging studies  CBC:  Recent Labs  Lab 03/20/19 2147 03/21/19 0244  WBC 13.3* 11.4*  NEUTROABS 12.0*  --   HGB 14.2 13.9  HCT 43.2 42.5  MCV 99.5 100.7*  PLT 84* 75*   Basic Metabolic Panel: Recent Labs  Lab 03/20/19 2147 03/21/19 0244  NA 137 139  K 3.4* 3.5  CL 105 107  CO2 22 20*  GLUCOSE 117* 119*  BUN 13 14  CREATININE 1.28* 1.14  CALCIUM 8.6* 8.2*  MG  --  1.6*   GFR: Estimated Creatinine Clearance: 76.8 mL/min (by C-G formula based on SCr of 1.14 mg/dL). Liver Function Tests: Recent Labs  Lab 03/20/19 2147  AST 29  ALT 26  ALKPHOS 92  BILITOT 2.2*  PROT 8.2*  ALBUMIN 3.6   No results for input(s): LIPASE, AMYLASE in the last 168 hours. No results for input(s): AMMONIA in the last 168 hours. Coagulation  Profile: No results for input(s): INR, PROTIME in the last 168 hours. Cardiac Enzymes: No results for input(s): CKTOTAL, CKMB, CKMBINDEX, TROPONINI in the last 168 hours. BNP (last 3 results) No results for input(s): PROBNP in the last 8760 hours. HbA1C: No results for input(s): HGBA1C in the last 72 hours. CBG: No results for input(s): GLUCAP in the last 168 hours. Lipid Profile: No results for input(s): CHOL, HDL, LDLCALC, TRIG, CHOLHDL, LDLDIRECT in the last 72 hours. Thyroid Function Tests: No results for input(s): TSH, T4TOTAL, FREET4, T3FREE, THYROIDAB in the last 72 hours. Anemia Panel: No results for input(s): VITAMINB12, FOLATE, FERRITIN, TIBC, IRON, RETICCTPCT in the last 72 hours. Sepsis Labs: Recent Labs  Lab 03/20/19 2147 03/20/19 2353 03/21/19 0244  PROCALCITON  --   --  4.24  LATICACIDVEN 3.2* 2.4*  --     Recent Results (from the past 240 hour(s))  Blood culture (routine x 2)     Status: None (Preliminary result)   Collection Time: 03/20/19  9:47 PM   Specimen: BLOOD  Result Value Ref Range Status   Specimen Description   Final    BLOOD LEFT ANTECUBITAL Performed at [facility], [place] [address] [place], [place] [address]    Special Requests   Final    BOTTLES DRAWN AEROBIC AND ANAEROBIC Blood Culture adequate volume Performed at [facility] [address], [place], [place] [address]    Culture   Final    NO GROWTH < 12 HOURS Performed at [facility] [address] [place], [place] [address]    Report Status PENDING  Incomplete  SARS Coronavirus 2 (CEPHEID - Performed in [facility] hospital lab), Hosp Order     Status: None   Collection Time: 03/20/19  9:47 PM   Specimen: Nasopharyngeal Swab  Result Value Ref Range Status   SARS Coronavirus 2 NEGATIVE NEGATIVE Final    Comment: (NOTE) If result is NEGATIVE SARS-CoV-2 target nucleic acids are NOT DETECTED. The SARS-CoV-2 RNA is generally detectable in  upper and lower  respiratory specimens during the acute phase of infection. The lowest  concentration of SARS-CoV-2 viral copies this assay can detect is 250  copies / mL. A negative result does not preclude SARS-CoV-2 infection  and should not be used as the sole basis for treatment or other  patient management decisions.  A negative result may occur with  improper specimen collection / handling, submission of specimen other  than nasopharyngeal swab, presence of viral mutation(s) within the  areas targeted by this assay, and inadequate number of viral copies  (<  250 copies / mL). A negative result must be combined with clinical  observations, patient history, and epidemiological information. If result is POSITIVE SARS-CoV-2 target nucleic acids are DETECTED. The SARS-CoV-2 RNA is generally detectable in upper and lower  respiratory specimens dur ing the acute phase of infection.  Positive  results are indicative of active infection with SARS-CoV-2.  Clinical  correlation with patient history and other diagnostic information is  necessary to determine patient infection status.  Positive results do  not rule out bacterial infection or co-infection with other viruses. If result is PRESUMPTIVE POSTIVE SARS-CoV-2 nucleic acids MAY BE PRESENT.   A presumptive positive result was obtained on the submitted specimen  and confirmed on repeat testing.  While 2019 novel coronavirus  (SARS-CoV-2) nucleic acids may be present in the submitted sample  additional confirmatory testing may be necessary for epidemiological  and / or clinical management purposes  to differentiate between  SARS-CoV-2 and other Sarbecovirus currently known to infect humans.  If clinically indicated additional testing with an alternate test  methodology [phone]) is advised. The SARS-CoV-2 RNA is generally  detectable in upper and lower respiratory sp ecimens during the acute  phase of infection. The expected result is  Negative. Fact Sheet for Patients:  StrictlyIdeas.no Fact Sheet for Healthcare Providers: BankingDealers.co.za This test is not yet approved or cleared by the [place] FDA and has been authorized for detection and/or diagnosis of SARS-CoV-2 by FDA under an Emergency Use Authorization (EUA).  This EUA will remain in effect (meaning this test can be used) for the duration of the COVID-19 declaration under Section 564(b)(1) of the Act, 21 U.S.C. section 360bbb-3(b)(1), unless the authorization is terminated or revoked sooner. Performed at [facility], [place] [address], [place], [place] [address]   Blood culture (routine x 2)     Status: None (Preliminary result)   Collection Time: 03/20/19  9:52 PM   Specimen: BLOOD LEFT FOREARM  Result Value Ref Range Status   Specimen Description   Final    BLOOD LEFT FOREARM Performed at [facility] [address], [place], [place] [address]    Special Requests   Final    BOTTLES DRAWN AEROBIC AND ANAEROBIC Blood Culture results may not be optimal due to an inadequate volume of blood received in culture bottles Performed at [facility] [address], [place], [place] [address]    Culture   Final    NO GROWTH < 12 HOURS Performed at [facility] [address], [place], [place] [address]    Report Status PENDING  Incomplete  MRSA PCR Screening     Status: Abnormal   Collection Time: 03/21/19  1:45 AM   Specimen: Nasal Mucosa; Nasopharyngeal  Result Value Ref Range Status   MRSA by PCR POSITIVE (A) NEGATIVE Final    Comment:        The GeneXpert MRSA Assay (FDA approved for NASAL specimens only), is one component of a comprehensive MRSA colonization surveillance program. It is not intended to diagnose MRSA infection nor to guide or monitor treatment for MRSA infections. RESULT CALLED TO, READ BACK BY AND VERIFIED  WITH: JOHNSON AT 0317 ON 03/21/2019 BY JPM Performed at [facility] [address], [place], [place] [address]          Radiology Studies: Dg Chest Port 1 View  Result Date: 03/21/2019 CLINICAL DATA:  Respiratory failure with hypoxia. EXAM: PORTABLE CHEST 1 VIEW COMPARISON:  03/20/2019; 07/10/2017; CT abdomen pelvis - 11/30/2018 FINDINGS: Grossly unchanged enlarged cardiac silhouette and mediastinal contours with atherosclerotic plaque within thoracic aorta. There is persistent thickening the right paratracheal stripe presumably secondary prominent vasculature. The pulmonary vasculature remains indistinct with cephalization of flow. Suspected development of small bilateral effusions with associated worsening bibasilar heterogeneous/consolidative opacities. No pneumothorax. No acute osseous abnormalities. IMPRESSION: Cardiomegaly with findings suggestive of pulmonary edema with suspected trace bilateral effusions and worsening bibasilar opacities, atelectasis versus infiltrate. Further evaluation with a PA and lateral chest radiograph may be obtained as clinically indicated. Electronically Signed   By: Sandi Mariscal M.D.   On: 03/21/2019 07:36   Dg Chest Portable 1 View  Result Date: 03/20/2019 CLINICAL DATA:  Fever EXAM: PORTABLE CHEST 1 VIEW COMPARISON:  07/10/2017 FINDINGS: Cardiomegaly with central vascular congestion. Low lung volumes. Patchy opacity at the left base. No pneumothorax. IMPRESSION: 1. Low lung volumes. Patchy atelectasis or mild infiltrate at the left base 2. Cardiomegaly with mild central congestion Electronically Signed   By: Donavan Foil M.D.   On: 03/20/2019 22:19   Vas [place] Lower Extremity Venous (dvt)  Result Date: 03/21/2019  Lower Venous Study Indications: Swelling.  Risk Factors: None identified. Limitations: Body habitus, poor ultrasound/tissue interface and skin changes. Comparison Study: No prior studies. Performing Technologist: Oliver Hum  RVT  Examination Guidelines: A complete evaluation includes B-mode imaging, spectral Doppler, color Doppler, and power Doppler as needed of all accessible portions of each vessel. Bilateral testing is considered an integral part of a complete examination. Limited examinations for reoccurring indications may be performed as noted.  +-----+---------------+---------+-----------+----------+-------+  RIGHT Compressibility Phasicity Spontaneity Properties Summary  +-----+---------------+---------+-----------+----------+-------+  CFV   Full            Yes       Yes                             +-----+---------------+---------+-----------+----------+-------+   +---------+---------------+---------+-----------+----------+-------+  LEFT      Compressibility Phasicity Spontaneity Properties Summary  +---------+---------------+---------+-----------+----------+-------+  CFV       Full            Yes       Yes                             +---------+---------------+---------+-----------+----------+-------+  SFJ       Full                                                      +---------+---------------+---------+-----------+----------+-------+  FV Prox   Full                                                      +---------+---------------+---------+-----------+----------+-------+  FV Mid    Full                                                      +---------+---------------+---------+-----------+----------+-------+  FV Distal Full                                                      +---------+---------------+---------+-----------+----------+-------+  PFV       Full                                                      +---------+---------------+---------+-----------+----------+-------+  POP       Full            Yes       Yes                             +---------+---------------+---------+-----------+----------+-------+  PTV       Full                                                       +---------+---------------+---------+-----------+----------+-------+  PERO      Full                                                      +---------+---------------+---------+-----------+----------+-------+     Summary: Right: No evidence of common femoral vein obstruction. Left: There is no evidence of deep vein thrombosis in the lower extremity. No cystic structure found in the popliteal fossa.  *See table(s) above for measurements and observations.    Preliminary         Scheduled Meds:  chlorhexidine  15 mL Mouth Rinse BID   Chlorhexidine Gluconate Cloth  6 each Topical Daily   mouth rinse  15 mL Mouth Rinse q12n4p   mupirocin ointment  1 application Nasal BID   Continuous Infusions:  azithromycin Stopped (03/21/19 0329)   cefTRIAXone (ROCEPHIN)  IV Stopped (03/21/19 0558)     LOS: 0 days    Time spent: 44 min    Nicolette Bang, MD [facility]  If 7PM-7AM, please contact night-coverage  03/21/2019, 11:28 AM

## 2019-03-21 NOTE — TOC Initial Note (Signed)
Transition of Care [facility]) - Initial/Assessment Note    Patient Details  Name: Eric Schultz MRN: [id number] Date of Birth: Aug 13, 1941  [facility]) CM/SW Contact:    Nila Nephew, LCSW Phone Number: [phone] 03/21/2019, 3:00 PM  Clinical Narrative:   Pt admitted with respiratory failure and sepsis, possible pneumonia, from home where he resides in home with his brother and sister-in-law. Consulted upon pt's admission due to "[employer] and fire department had to shovel patient out of his home because it was so dirty." Attempted to meet with pt- he responded to voice but would fall asleep as CSW was talking with him, unable to meaningfully converse right now.  Spoke with pts brother Eric Schultz (below) to inquire about home situation. Eric Schultz states, "I don't really feel at liberty to discuss it with you, it's his (patient's) home." CSW inquired if home is safe and Eric Schultz states, "Yes and no" not elaborating. CSW inquired if any measures could be taken by hospital/CSW at this moment to ensure safety, Eric Schultz states, "We don't need that."   Unable to assess need/situation at home at this time, will attempt to follow up with pt when he is more alert.              Expected Discharge Plan: Home/Self Care     Patient Goals and CMS Choice Patient states their goals for this hospitalization and ongoing recovery are:: did not wake enough to goal plan      Expected Discharge Plan and Services Expected Discharge Plan: Home/Self Care In-house Referral: Clinical Social Work                                            Prior Living Arrangements/Services   Lives with:: Siblings                   Activities of Daily Living Home Assistive Devices/Equipment: None ADL Screening (condition at time of admission) Patient's cognitive ability adequate to safely complete daily activities?: Yes Is the patient deaf or have difficulty hearing?: No Does the patient have difficulty seeing,  even when wearing glasses/contacts?: No Does the patient have difficulty concentrating, remembering, or making decisions?: No Patient able to express need for assistance with ADLs?: Yes Does the patient have difficulty dressing or bathing?: Yes Independently performs ADLs?: No Communication: Independent Dressing (OT): Dependent Is this a change from baseline?: Change from baseline, expected to last <3days Grooming: Dependent Is this a change from baseline?: Change from baseline, expected to last <3 days Feeding: Independent Bathing: Needs assistance Is this a change from baseline?: Pre-admission baseline Toileting: Dependent Is this a change from baseline?: Change from baseline, expected to last <3 days In/Out Bed: Needs assistance Is this a change from baseline?: Pre-admission baseline Walks in Home: Dependent Is this a change from baseline?: Change from baseline, expected to last <3 days Does the patient have difficulty walking or climbing stairs?: Yes Weakness of Legs: Both Weakness of Arms/Hands: Both  Permission Sought/Granted Permission sought to share information with : Family Supports Permission granted to share information with : Yes, Verbal Permission Granted  Share Information with NAME: brother Eric Schultz [phone]           Emotional Assessment Appearance:: Appears stated age Attitude/Demeanor/Rapport: (drowsy)   Orientation: : (has been oriented per charting, however currently too drowsy to assess orientation)  Admission diagnosis:  Hypoxia [R09.02] Cellulitis of left lower extremity [L03.116] Community acquired pneumonia, unspecified laterality [J18.9] Patient Active Problem List   Diagnosis Date Noted  . Community acquired pneumonia 03/21/2019  . Cellulitis of foot, left 03/21/2019  . Chronic venous stasis dermatitis of both lower extremities 03/21/2019  . AKI (acute kidney injury) ([place]) 03/21/2019  . Acute on chronic respiratory failure with hypoxia  ([place]) 03/21/2019  . Hypokalemia 02/09/2014   PCP:  [facility]:   [facility] [place], [place] [address] Phone: [phone] Fax: [phone]  [id number] - [address] [place] [address] [place] [address] Phone: [phone] Fax: [phone]     Social Determinants of Health (SDOH) Interventions    Readmission Risk Interventions No flowsheet data found.

## 2019-03-21 NOTE — ED Notes (Signed)
Pt reapplied to the monitor and re-educated patient on the importance of staying on the monitor, pts oxygen in upper 80s, pt placed back on non-rebreather.

## 2019-03-21 NOTE — Progress Notes (Signed)
Per RN- PT removed from BiPAP and placed on 4 lpm nasal cannula at approximately 1115. 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 RT decreased [place] to 3 lpm (RN aware)- Sp02 goal >=92%.

## 2019-03-21 NOTE — Progress Notes (Signed)
Left lower extremity venous duplex has been completed. Preliminary results can be found in CV Proc through chart review.   03/21/19 9:44 AM Carlos Levering RVT

## 2019-03-21 NOTE — Progress Notes (Signed)
  Echocardiogram 2D Echocardiogram has been performed.  Eric Schultz 03/21/2019, 2:20 PM

## 2019-03-21 NOTE — H&P (Signed)
History and Physical    Eric Schultz VQM:[id number] DOB: August 03, 1941 DOA: 03/20/2019  PCP: [facility]  Patient coming from: Home  I have personally briefly reviewed patient's old medical records in [place]  Chief Complaint: Shortness of breath  HPI: Eric Schultz is a 78 y.o. male with medical history significant for hypertension, gout, thrombocytopenia, OSA, chronic venous stasis dermatitis of both lower extremities who presents to the ED for evaluation of shortness of breath.  Patient states he had new onset shortness of breath and cough productive of scant yellow sputum, generalized weakness beginning 2 days ago.  He has chronic swelling and dermatitis changes in both of his legs which he says has been going on for many years.  He has had subjective fevers and poor appetite.  He was with his brother and has no sick contacts that he is aware of.  He denies any recent travel.  He states he does not take any medications regularly.  He denies any chest pain, palpitations, abdominal pain, dysuria, diarrhea.  ED Course:  Initial vitals showed BP 118/70, pulse 100, RR 35, temp 100.5 Fahrenheit, SPO2 96% on nonrebreather.  Labs notable for WBC 13.3, hemoglobin 14.2, platelets 84,000 (baseline 90,000), sodium 137, potassium 3.4, bicarb 22, BUN 13, creatinine 1.28, lactic acid 3.2.  Urinalysis negative for UTI.  Blood cultures were drawn and pending.  SARS-CoV-2 tested negative.  Portable chest x-ray shows enlarged cardiac silhouette with central vascular congestion, low lung volumes and patchy atelectasis versus small infiltrate at the left base.  Patient was given 1 L normal saline and started on IV vancomycin and cefepime.  Repeat lactic acid was 2.4.  The hospitalist service was consulted admit for further evaluation and management.   Review of Systems: All systems reviewed and are negative except as documented in history of present illness above.   Past Medical  History:  Diagnosis Date  . Cellulitis    left lower leg  . Gout   . Hypertension    "borderline"  . Sleep apnea     History reviewed. No pertinent surgical history.  Social History:  reports that he has never smoked. He has never used smokeless tobacco. He reports that he does not drink alcohol or use drugs.  Allergies  Allergen Reactions  . Sulfa Antibiotics Nausea And Vomiting    Family History  Problem Relation Age of Onset  . Heart failure Mother   . Heart failure Father      Prior to Admission medications   Medication Sig Start Date End Date Taking? Authorizing Provider  fluticasone (FLONASE) 50 MCG/ACT nasal spray Place 1 spray into both nostrils daily. 06/15/18   Orma Flaming, MD  furosemide (LASIX) 20 MG tablet Take 1 tablet (20 mg total) by mouth daily. 08/19/18   Raylene Everts, MD  gabapentin (NEURONTIN) 300 MG capsule Take 1 capsule (300 mg total) by mouth 3 (three) times daily for 30 days. 09/13/18 10/13/18  Curatolo, Adam, DO  HYDROcodone-acetaminophen (NORCO/VICODIN) 5-325 MG tablet Take 1-2 tablets by mouth every 6 (six) hours as needed for severe pain. 11/30/18   Duffy Bruce, MD  ibuprofen (ADVIL,MOTRIN) 800 MG tablet Take 1 tablet (800 mg total) by mouth 3 (three) times daily. 09/24/18   Wynona Luna, MD  indomethacin (INDOCIN) 25 MG capsule Take 1 capsule (25 mg total) by mouth 3 (three) times daily as needed (for gout). 01/03/19   Vanessa Kick, MD  nystatin (MYCOSTATIN/NYSTOP) powder Apply topically 3 (three) times daily.  To groin area/rash for 10 days 11/30/18   Duffy Bruce, MD  polyethylene glycol (MIRALAX) 17 g packet Take 17 g by mouth daily. 02/23/19   Lamptey, Myrene Galas, MD  polyethylene glycol powder (GLYCOLAX/MIRALAX) powder Take 17 g by mouth daily. 07/14/18   Wieters, Hallie C, PA-C  potassium chloride SA (K-DUR,KLOR-CON) 20 MEQ tablet Take 1 tablet (20 mEq total) by mouth daily. 08/19/18   Raylene Everts, MD    Physical Exam:  Vitals:   03/20/19 2251 03/20/19 2300 03/20/19 2353 03/21/19 0030  BP: 136/82 139/77  123/69  Pulse: (!) 112 (!) 110  63  Resp: (!) 31 (!) 34  (!) 33  Temp:   99.8 F (37.7 C)   TempSrc:   Oral   SpO2: 92% 92%  94%  Weight:      Height:        Constitutional: Chronically ill-appearing man resting supine in bed, NAD, calm, shivering Eyes: PERRL, lids and conjunctivae normal ENMT: Mucous membranes are moist. Posterior pharynx clear of any exudate or lesions.Normal dentition.  Intermittent coughing. Neck: normal, supple, no masses. Respiratory: Exam Limited due to body habitus, clear to auscultation bilaterally, without obvious wheezing or crackles. Normal respiratory effort. No accessory muscle use.   Cardiovascular: Regular rate and rhythm, no murmurs / rubs / gallops.  2+ pitting edema bilateral lower extremities. Abdomen: Obese abdomen, no tenderness, no masses palpated. No hepatosplenomegaly. Bowel sounds positive.  Musculoskeletal: no clubbing / cyanosis. No joint deformity upper and lower extremities. Good ROM, no contractures. Normal muscle tone.  Skin: Chronic venous stasis dermatitis of both lower extremities, erythema and increased warmth at all dorsal left foot suspicious for cellulitis Neurologic: CN 2-12 grossly intact. Sensation intact, Strength 5/5 in all 4.  Psychiatric:  Alert and oriented x 3. Normal mood.     Labs on Admission: I have personally reviewed following labs and imaging studies  CBC: Recent Labs  Lab 03/20/19 2147  WBC 13.3*  NEUTROABS 12.0*  HGB 14.2  HCT 43.2  MCV 99.5  PLT 84*   Basic Metabolic Panel: Recent Labs  Lab 03/20/19 2147  NA 137  K 3.4*  CL 105  CO2 22  GLUCOSE 117*  BUN 13  CREATININE 1.28*  CALCIUM 8.6*   GFR: Estimated Creatinine Clearance: 65.6 mL/min (A) (by C-G formula based on SCr of 1.28 mg/dL (H)). Liver Function Tests: Recent Labs  Lab 03/20/19 2147  AST 29  ALT 26  ALKPHOS 92  BILITOT 2.2*  PROT 8.2*   ALBUMIN 3.6   No results for input(s): LIPASE, AMYLASE in the last 168 hours. No results for input(s): AMMONIA in the last 168 hours. Coagulation Profile: No results for input(s): INR, PROTIME in the last 168 hours. Cardiac Enzymes: No results for input(s): CKTOTAL, CKMB, CKMBINDEX, TROPONINI in the last 168 hours. BNP (last 3 results) No results for input(s): PROBNP in the last 8760 hours. HbA1C: No results for input(s): HGBA1C in the last 72 hours. CBG: No results for input(s): GLUCAP in the last 168 hours. Lipid Profile: No results for input(s): CHOL, HDL, LDLCALC, TRIG, CHOLHDL, LDLDIRECT in the last 72 hours. Thyroid Function Tests: No results for input(s): TSH, T4TOTAL, FREET4, T3FREE, THYROIDAB in the last 72 hours. Anemia Panel: No results for input(s): VITAMINB12, FOLATE, FERRITIN, TIBC, IRON, RETICCTPCT in the last 72 hours. Urine analysis:    Component Value Date/Time   COLORURINE YELLOW 03/20/2019 2252   APPEARANCEUR CLEAR 03/20/2019 2252   LABSPEC 1.020 03/20/2019 2252  PHURINE 5.0 03/20/2019 2252   GLUCOSEU NEGATIVE 03/20/2019 2252   HGBUR SMALL (A) 03/20/2019 2252   BILIRUBINUR NEGATIVE 03/20/2019 2252   KETONESUR NEGATIVE 03/20/2019 2252   PROTEINUR NEGATIVE 03/20/2019 2252   UROBILINOGEN 0.2 02/09/2014 1954   NITRITE NEGATIVE 03/20/2019 2252   LEUKOCYTESUR NEGATIVE 03/20/2019 2252    Radiological Exams on Admission: Dg Chest Portable 1 View  Result Date: 03/20/2019 CLINICAL DATA:  Fever EXAM: PORTABLE CHEST 1 VIEW COMPARISON:  07/10/2017 FINDINGS: Cardiomegaly with central vascular congestion. Low lung volumes. Patchy opacity at the left base. No pneumothorax. IMPRESSION: 1. Low lung volumes. Patchy atelectasis or mild infiltrate at the left base 2. Cardiomegaly with mild central congestion Electronically Signed   By: Donavan Foil M.D.   On: 03/20/2019 22:19    EKG: Independently reviewed.  Irregular rhythm with RBBB and LAFB.  Assessment/Plan  Principal Problem:   Acute on chronic respiratory failure with hypoxia (HCC) Active Problems:   Hypokalemia   Community acquired pneumonia   Cellulitis of foot, left   Chronic venous stasis dermatitis of both lower extremities   AKI (acute kidney injury) ([place])  Eric Schultz is a 78 y.o. male with medical history significant for hypertension, gout, thrombocytopenia, OSA, chronic venous stasis dermatitis of both lower extremities who is admitted with acute respiratory failure with hypoxia and cellulitis of the left foot.   Sepsis with acute respiratory failure with hypoxia: Chest x-ray with possible left sided pneumonia.  Patient presenting with shortness of breath, cough productive of scant yellow sputum, leukocytosis, and fever.  Will treat as community acquired pneumonia.  There is also suspicion for underlying congestive heart failure. -Continue IV ceftriaxone and azithromycin -Follow blood cultures, strep pneumonia and Legionella urine antigens -Continue supplemental oxygen as needed and wean off as able, use BiPAP as needed -Obtain 2 view chest x-ray in a.m. -Will check BNP, echocardiogram  Cellulitis of left foot with chronic venous stasis dermatitis of both lower extremities: -Continue IV ceftriaxone  Acute kidney injury: Suspect due to sepsis from above.  Patient was given 1 L normal saline in ED.  Will hold further fluids for now and repeat labs in a.m.  Hypertension: Patient states he is not taking any antihypertensives.  BP currently stable on admission.  Continue to monitor.  Hypokalemia: Oral repletion ordered.  Thrombocytopenia: Chronic and appears near baseline.  Unclear if this is been worked up in the past.  No active bleeding noted.  Hold pharmacologic VTE prophylaxis.  OSA: Not using CPAP at home.  Continue supplemental O2 as needed.   DVT prophylaxis: SCDs Code Status: Full code, confirmed with patient Family Communication: None present admission  Disposition Plan: Pending clinical progress Consults called: None Admission status: Admit - It is my clinical opinion that admission to INPATIENT is reasonable and necessary because of the expectation that this patient will require hospital care that crosses at least 2 midnights to treat this condition based on the medical complexity of the problems presented.  Given the aforementioned information, the predictability of an adverse outcome is felt to be significant.      Zada Finders MD [facility]  If 7PM-7AM, please contact night-coverage [email or web address]  03/21/2019, 1:15 AM

## 2019-03-21 NOTE — ED Notes (Signed)
ED TO INPATIENT HANDOFF REPORT  ED Nurse Name and Phone #: Earnest Bailey [phone]  S Name/Age/Gender Eric Schultz 78 y.o. male Room/Bed: WA14/WA14  Code Status   Code Status: Prior  Home/SNF/Other Home Patient oriented to: self, place and situation Is this baseline? No   Triage Complete: Triage complete  Chief Complaint shob; leg pain  Triage Note Pt arrived via [facility] after being called out for weakness, shortness of breath, and dizziness. Pt has bilateral cellulitis, more significant on the left leg. Pt febrile upon arrival.      Allergies Allergies  Allergen Reactions  . Sulfa Antibiotics Nausea And Vomiting    Level of Care/Admitting Diagnosis ED Disposition    ED Disposition Condition Comment   Admit  Hospital Area: [facility] [[id number]]  Level of Care: Stepdown [14]  Admit to SDU based on following criteria: Respiratory Distress:  Frequent assessment and/or intervention to maintain adequate ventilation/respiration, pulmonary toilet, and respiratory treatment.  Covid Evaluation: Confirmed COVID Negative  Diagnosis: Community acquired pneumonia [[id number]]  Admitting Physician: Lenore Cordia [[id number]]  Attending Physician: Lenore Cordia [[id number]]  Estimated length of stay: past midnight tomorrow  Certification:: I certify this patient will need inpatient services for at least 2 midnights  PT Class (Do Not Modify): Inpatient [101]  PT Acc Code (Do Not Modify): Private [1]       B Medical/Surgery History Past Medical History:  Diagnosis Date  . Cellulitis    left lower leg  . Gout   . Hypertension    "borderline"  . Sleep apnea    History reviewed. No pertinent surgical history.   A IV Location/Drains/Wounds Patient Lines/Drains/Airways Status   Active Line/Drains/Airways    Name:   Placement date:   Placement time:   Site:   Days:   Peripheral IV 03/20/19 Left Antecubital   03/20/19    2138    Antecubital   1           Intake/Output Last 24 hours  Intake/Output Summary (Last 24 hours) at 03/21/2019 0058 Last data filed at 03/20/2019 2353 Gross per 24 hour  Intake 1100 ml  Output -  Net 1100 ml    Labs/Imaging Results for orders placed or performed during the hospital encounter of 03/20/19 (from the past 48 hour(s))  CBC with Differential     Status: Abnormal   Collection Time: 03/20/19  9:47 PM  Result Value Ref Range   WBC 13.3 (H) 4.0 - 10.5 K/uL   RBC 4.34 4.22 - 5.81 MIL/uL   Hemoglobin 14.2 13.0 - 17.0 g/dL   HCT 43.2 39.0 - 52.0 %   MCV 99.5 80.0 - 100.0 fL   MCH 32.7 26.0 - 34.0 pg   MCHC 32.9 30.0 - 36.0 g/dL   RDW 13.7 11.5 - 15.5 %   Platelets 84 (L) 150 - 400 K/uL    Comment: REPEATED TO VERIFY PLATELET COUNT CONFIRMED BY SMEAR SPECIMEN CHECKED FOR CLOTS Immature Platelet Fraction may be clinically indicated, consider ordering this additional test [id number]    nRBC 0.0 0.0 - 0.2 %   Neutrophils Relative % 90 %   Neutro Abs 12.0 (H) 1.7 - 7.7 K/uL   Lymphocytes Relative 5 %   Lymphs Abs 0.7 0.7 - 4.0 K/uL   Monocytes Relative 4 %   Monocytes Absolute 0.5 0.1 - 1.0 K/uL   Eosinophils Relative 0 %   Eosinophils Absolute 0.0 0.0 - 0.5 K/uL   Basophils Relative 0 %  Basophils Absolute 0.0 0.0 - 0.1 K/uL   RBC Morphology MORPHOLOGY UNREMARKABLE    Immature Granulocytes 1 %   Abs Immature Granulocytes 0.14 (H) 0.00 - 0.07 K/uL    Comment: Performed at [facility], [place] [address], [place], [place] [address]  Comprehensive metabolic panel     Status: Abnormal   Collection Time: 03/20/19  9:47 PM  Result Value Ref Range   Sodium 137 135 - 145 mmol/L   Potassium 3.4 (L) 3.5 - 5.1 mmol/L   Chloride 105 98 - 111 mmol/L   CO2 22 22 - 32 mmol/L   Glucose, Bld 117 (H) 70 - 99 mg/dL   BUN 13 8 - 23 mg/dL   Creatinine, Ser 1.28 (H) 0.61 - 1.24 mg/dL   Calcium 8.6 (L) 8.9 - 10.3 mg/dL   Total Protein 8.2 (H) 6.5 - 8.1 g/dL   Albumin 3.6 3.5 - 5.0 g/dL    AST 29 15 - 41 U/L   ALT 26 0 - 44 U/L   Alkaline Phosphatase 92 38 - 126 U/L   Total Bilirubin 2.2 (H) 0.3 - 1.2 mg/dL   GFR calc non Af Amer 54 (L) >60 mL/min   GFR calc Af Amer >60 >60 mL/min   Anion gap 10 5 - 15    Comment: Performed at [facility], [place] [address], [place], [place] [address]  Lactic acid, plasma     Status: Abnormal   Collection Time: 03/20/19  9:47 PM  Result Value Ref Range   Lactic Acid, Venous 3.2 (HH) 0.5 - 1.9 mmol/L    Comment: CRITICAL RESULT CALLED TO, READ BACK BY AND VERIFIED WITH: HODGES,C RN @2237  ON 03/20/2019 JACKSON,K Performed at [facility], [place] [address] [place], [place] [address]   SARS Coronavirus 2 (CEPHEID - Performed in [place] hospital lab), Hosp Order     Status: None   Collection Time: 03/20/19  9:47 PM   Specimen: Nasopharyngeal Swab  Result Value Ref Range   SARS Coronavirus 2 NEGATIVE NEGATIVE    Comment: (NOTE) If result is NEGATIVE SARS-CoV-2 target nucleic acids are NOT DETECTED. The SARS-CoV-2 RNA is generally detectable in upper and lower  respiratory specimens during the acute phase of infection. The lowest  concentration of SARS-CoV-2 viral copies this assay can detect is 250  copies / mL. A negative result does not preclude SARS-CoV-2 infection  and should not be used as the sole basis for treatment or other  patient management decisions.  A negative result may occur with  improper specimen collection / handling, submission of specimen other  than nasopharyngeal swab, presence of viral mutation(s) within the  areas targeted by this assay, and inadequate number of viral copies  (<250 copies / mL). A negative result must be combined with clinical  observations, patient history, and epidemiological information. If result is POSITIVE SARS-CoV-2 target nucleic acids are DETECTED. The SARS-CoV-2 RNA is generally detectable in upper and lower  respiratory specimens dur ing the  acute phase of infection.  Positive  results are indicative of active infection with SARS-CoV-2.  Clinical  correlation with patient history and other diagnostic information is  necessary to determine patient infection status.  Positive results do  not rule out bacterial infection or co-infection with other viruses. If result is PRESUMPTIVE POSTIVE SARS-CoV-2 nucleic acids MAY BE PRESENT.   A presumptive positive result was obtained on the submitted specimen  and confirmed on repeat testing.  While 2019 novel  coronavirus  (SARS-CoV-2) nucleic acids may be present in the submitted sample  additional confirmatory testing may be necessary for epidemiological  and / or clinical management purposes  to differentiate between  SARS-CoV-2 and other Sarbecovirus currently known to infect humans.  If clinically indicated additional testing with an alternate test  methodology [phone]) is advised. The SARS-CoV-2 RNA is generally  detectable in upper and lower respiratory sp ecimens during the acute  phase of infection. The expected result is Negative. Fact Sheet for Patients:  StrictlyIdeas.no Fact Sheet for Healthcare Providers: BankingDealers.co.za This test is not yet approved or cleared by the [place] FDA and has been authorized for detection and/or diagnosis of SARS-CoV-2 by FDA under an Emergency Use Authorization (EUA).  This EUA will remain in effect (meaning this test can be used) for the duration of the COVID-19 declaration under Section 564(b)(1) of the Act, 21 U.S.C. section 360bbb-3(b)(1), unless the authorization is terminated or revoked sooner. Performed at [facility], [place] [address], [place], [place] [address]   Urinalysis, Routine w reflex microscopic     Status: Abnormal   Collection Time: 03/20/19 10:52 PM  Result Value Ref Range   Color, Urine YELLOW YELLOW   APPearance CLEAR CLEAR   Specific  Gravity, Urine 1.020 1.005 - 1.030   pH 5.0 5.0 - 8.0   Glucose, UA NEGATIVE NEGATIVE mg/dL   Hgb urine dipstick SMALL (A) NEGATIVE   Bilirubin Urine NEGATIVE NEGATIVE   Ketones, ur NEGATIVE NEGATIVE mg/dL   Protein, ur NEGATIVE NEGATIVE mg/dL   Nitrite NEGATIVE NEGATIVE   Leukocytes,Ua NEGATIVE NEGATIVE   RBC / HPF 0-5 0 - 5 RBC/hpf   WBC, UA 0-5 0 - 5 WBC/hpf   Bacteria, UA NONE SEEN NONE SEEN   Squamous Epithelial / LPF 0-5 0 - 5   Mucus PRESENT     Comment: Performed at [facility], [place] [address] [place], [place] [address]  Lactic acid, plasma     Status: Abnormal   Collection Time: 03/20/19 11:53 PM  Result Value Ref Range   Lactic Acid, Venous 2.4 (HH) 0.5 - 1.9 mmol/L    Comment: CRITICAL RESULT CALLED TO, READ BACK BY AND VERIFIED WITH: COLES, L AT 0035 ON 03/21/2019 BY JPM Performed at [facility], [place] [address], [place], [place] [address]    Dg Chest Portable 1 View  Result Date: 03/20/2019 CLINICAL DATA:  Fever EXAM: PORTABLE CHEST 1 VIEW COMPARISON:  07/10/2017 FINDINGS: Cardiomegaly with central vascular congestion. Low lung volumes. Patchy opacity at the left base. No pneumothorax. IMPRESSION: 1. Low lung volumes. Patchy atelectasis or mild infiltrate at the left base 2. Cardiomegaly with mild central congestion Electronically Signed   By: Donavan Foil M.D.   On: 03/20/2019 22:19    Pending Labs Unresulted Labs (From admission, onward)    Start     Ordered   03/20/19 2147  Blood culture (routine x 2)  BLOOD CULTURE X 2,   STAT     03/20/19 2147   03/20/19 2147  Urine culture  ONCE - STAT,   STAT     03/20/19 2147          Vitals/Pain Today's Vitals   03/20/19 2255 03/20/19 2300 03/20/19 2353 03/21/19 0030  BP:  139/77  123/69  Pulse:  (!) 110  63  Resp:  (!) 34  (!) 33  Temp:   99.8 F (37.7 C)   TempSrc:   Oral   SpO2:  92%  94%  Weight:      Height:      PainSc: 0-No pain       Isolation Precautions No  active isolations  Medications Medications  vancomycin (VANCOCIN) 2,000 mg in sodium chloride 0.9 % 500 mL IVPB (2,000 mg Intravenous New Bag/Given 03/20/19 2313)  acetaminophen (TYLENOL) tablet 650 mg (650 mg Oral Given 03/20/19 2153)  sodium chloride 0.9 % bolus 1,000 mL (0 mLs Intravenous Stopped 03/20/19 2353)  ceFEPIme (MAXIPIME) 1 g in sodium chloride 0.9 % 100 mL IVPB (0 g Intravenous Stopped 03/20/19 2353)    Mobility non-ambulatory Moderate fall risk   Focused Assessments   R Recommendations: See Admitting Provider Note  Report given to:   Additional Notes: Cellulitis of bilateral legs

## 2019-03-21 NOTE — Progress Notes (Signed)
Patient not had anymore vomiting episodes. bipap has been reapplied.

## 2019-03-21 NOTE — ED Notes (Signed)
Date and time results received: 03/21/19 "12:41 AM  Test: Lactic Acid Critical Value: 2.4  Name of Provider Notified: Wilson Singer, MD Orders Received? Or Actions Taken?: Will follow up per protocol

## 2019-03-22 LAB — BLOOD CULTURE ID PANEL (REFLEXED)

## 2019-03-22 LAB — CBC WITH DIFFERENTIAL/PLATELET
Abs Immature Granulocytes: 0.11 10*3/uL — ABNORMAL HIGH (ref 0.00–0.07)
Basophils Absolute: 0 10*3/uL (ref 0.0–0.1)
Basophils Relative: 0 %
Eosinophils Absolute: 0.3 10*3/uL (ref 0.0–0.5)
Eosinophils Relative: 3 %
HCT: 36.4 % — ABNORMAL LOW (ref 39.0–52.0)
Hemoglobin: 12.4 g/dL — ABNORMAL LOW (ref 13.0–17.0)
Immature Granulocytes: 1 %
Lymphocytes Relative: 11 %
Lymphs Abs: 1.1 10*3/uL (ref 0.7–4.0)
MCH: 34.1 pg — ABNORMAL HIGH (ref 26.0–34.0)
MCHC: 34.1 g/dL (ref 30.0–36.0)
MCV: 100 fL (ref 80.0–100.0)
Monocytes Absolute: 0.7 10*3/uL (ref 0.1–1.0)
Monocytes Relative: 7 %
Neutro Abs: 7.4 10*3/uL (ref 1.7–7.7)
Neutrophils Relative %: 78 %
Platelets: 71 10*3/uL — ABNORMAL LOW (ref 150–400)
RBC: 3.64 MIL/uL — ABNORMAL LOW (ref 4.22–5.81)
RDW: 13.8 % (ref 11.5–15.5)
WBC: 9.5 10*3/uL (ref 4.0–10.5)
nRBC: 0 % (ref 0.0–0.2)

## 2019-03-22 LAB — BASIC METABOLIC PANEL
Anion gap: 6 (ref 5–15)
BUN: 17 mg/dL (ref 8–23)
CO2: 25 mmol/L (ref 22–32)
Calcium: 8.2 mg/dL — ABNORMAL LOW (ref 8.9–10.3)
Chloride: 107 mmol/L (ref 98–111)
Creatinine, Ser: 0.98 mg/dL (ref 0.61–1.24)
GFR calc Af Amer: >60 mL/min (ref >60)
GFR calc non Af Amer: >60 mL/min (ref >60)
Glucose, Bld: 98 mg/dL (ref 70–99)
Potassium: 3.2 mmol/L — ABNORMAL LOW (ref 3.5–5.1)
Sodium: 138 mmol/L (ref 135–145)

## 2019-03-22 LAB — URINE CULTURE: Culture: <10000 — AB

## 2019-03-22 LAB — LEGIONELLA PNEUMOPHILA SEROGP 1 UR AG: L. pneumophila Serogp 1 Ur Ag: NEGATIVE

## 2019-03-22 MED ORDER — POTASSIUM CHLORIDE CRYS ER 20 MEQ PO TBCR
40.0000 meq | EXTENDED_RELEASE_TABLET | Freq: Once | ORAL | Status: AC
  Administered 2019-03-22: 40 meq via ORAL
  Filled 2019-03-22: qty 2

## 2019-03-22 NOTE — Progress Notes (Signed)
Removed PT from BiPAP- is awake and alert. Placed PT on 2 lpm nasal cannula. RN aware.

## 2019-03-22 NOTE — Progress Notes (Addendum)
Received call from [place]. [name] [phone] who informed CSW that there was an APS report made for pt's home conditions (reports that there was report that pt "is hoarder") and that APS is following up with pt and family to investigate.  TOC team to update APS on pt's DC date/plan.  Sharren Bridge, MSW, LCSW Transitions of Care 03/22/2019 [phone]

## 2019-03-22 NOTE — Progress Notes (Signed)
Received pt from Step Down. Alert and oriented x4. SOB with exertion. Significant edema to bilateral LE, greater in left. Oriented to room. Eulas Post, RN

## 2019-03-22 NOTE — Progress Notes (Signed)
PROGRESS NOTE    NICKEY Schultz  TKP:[id number] DOB: Sep 04, 1940 DOA: 03/20/2019 [facility]   Brief Narrative:  Per admitting history and physical Eric Schultz Czerskiis a 78 y.o.malewith medical history significant forhypertension, gout, thrombocytopenia, OSA, chronic venous stasis dermatitis of both lower extremities who presents to the ED for evaluation of shortness of breath. Patient states he had new onset shortness of breath and cough productive of scant yellow sputum, generalized weakness beginning 2 days ago. He has chronic swelling and dermatitis changes in both of his legs which he says has been going on for many years. He has had subjective fevers and poor appetite. He was with his brother and has no sick contacts that he is aware of. He denies any recent travel. He states he does not take any medications regularly. He denied any chest pain, palpitations, abdominal pain, dysuria, diarrhea.  While in the hospital patient was treated for respiratory failure with hypoxia secondary to left-sided pneumonia, was found to have a gram-negative bacteremia as well as chronic cellulitis left foot with chronic venous stasis dermatitis of both lower extremities.  Hospital course as below   Assessment & Plan:   Principal Problem:   Acute on chronic respiratory failure with hypoxia (HCC) Active Problems:   Hypokalemia   Community acquired pneumonia   Cellulitis of foot, left   Chronic venous stasis dermatitis of both lower extremities   AKI (acute kidney injury) ([place])   Sepsis with acute respiratory failure with hypoxia and PNA: As previously noted, chest x-ray with possible left sided pneumonia. Patient presenting with shortness of breath, cough productive of scant yellow sputum, leukocytosis, and fever. Will cont to treat as community acquired pneumonia. There was suspicion for underlying congestive heart failure-echo was neg. -Continue IV ceftriaxone and  azithromycin -f/ up strep pneumonia and Legionella urine antigens -Continue supplemental oxygen weaned bipap, on [place] currently -Obtain 2 view chest x-ray in a.m -BNP elevated-but nl echo 60-65, no ddx noted  Gram-negative rod bacteremia. Continue current antibiotics,  awaiting speciation with sensitivities for abx adjustment White blood cells 9.5, hemodynamically stable  Cellulitis of left foot with chronic venous stasis dermatitis of both lower extremities: -Continue IV ceftriaxone for now -checked doppler, neg dvt  Acute kidney injury: Patient was given 1 L normal saline in ED. agree with holding fluids given pulm edema  Hypertension: Patient states he is not taking any antihypertensives. BP stable on admission but mild increase over last 24 - Continue to monitor, consider tx if increases  Hypokalemia: Oral repletion ordered. Recheck labs in AM  Thrombocytopenia: Chronic and appears near baseline. Unclear if this is been worked up in the past. No active bleeding noted. Hold pharmacologic VTE prophylaxis.  OSA: Not using CPAP at home-BUT NEEDS TO - did well with it in the hospital Continue supplemental O2 as needed.  DVT prophylaxis: SCD/Compression stockings  Code Status: Full    Code Status Orders  (From admission, onward)         Start     Ordered   03/21/19 0100  Full code  Continuous     03/21/19 0102        Code Status History    Date Active Date Inactive Code Status Order ID Comments User Context   02/09/2014 2332 02/12/2014 1314 Full Code [id number]  [name], MD Inpatient   Advance Care Planning Activity     Family Communication: called brother Nicole Kindred [phone], NA  Disposition Plan:   Patient will remain  inpatient for continued treatment IV antibiotics, respiratory support with BiPAP, daily labs, and frequent nurse interventions.  Without these treatments patient risk of severe life-threatening deterioration. Consults called: None  Admission status: Inpatient   Consultants:   None  Procedures:  Dg Chest Port 1 View  Result Date: 03/21/2019 CLINICAL DATA:  Respiratory failure with hypoxia. EXAM: PORTABLE CHEST 1 VIEW COMPARISON:  03/20/2019; 07/10/2017; CT abdomen pelvis - 11/30/2018 FINDINGS: Grossly unchanged enlarged cardiac silhouette and mediastinal contours with atherosclerotic plaque within thoracic aorta. There is persistent thickening the right paratracheal stripe presumably secondary prominent vasculature. The pulmonary vasculature remains indistinct with cephalization of flow. Suspected development of small bilateral effusions with associated worsening bibasilar heterogeneous/consolidative opacities. No pneumothorax. No acute osseous abnormalities. IMPRESSION: Cardiomegaly with findings suggestive of pulmonary edema with suspected trace bilateral effusions and worsening bibasilar opacities, atelectasis versus infiltrate. Further evaluation with a PA and lateral chest radiograph may be obtained as clinically indicated. Electronically Signed   By: Sandi Mariscal M.D.   On: 03/21/2019 07:36   Dg Chest Portable 1 View  Result Date: 03/20/2019 CLINICAL DATA:  Fever EXAM: PORTABLE CHEST 1 VIEW COMPARISON:  07/10/2017 FINDINGS: Cardiomegaly with central vascular congestion. Low lung volumes. Patchy opacity at the left base. No pneumothorax. IMPRESSION: 1. Low lung volumes. Patchy atelectasis or mild infiltrate at the left base 2. Cardiomegaly with mild central congestion Electronically Signed   By: Donavan Foil M.D.   On: 03/20/2019 22:19   Vas [place] Lower Extremity Venous (dvt)  Result Date: 03/21/2019  Lower Venous Study Indications: Swelling.  Risk Factors: None identified. Limitations: Body habitus, poor ultrasound/tissue interface and skin changes. Comparison Study: No prior studies. Performing Technologist: Oliver Hum RVT  Examination Guidelines: A complete evaluation includes B-mode imaging, spectral Doppler, color  Doppler, and power Doppler as needed of all accessible portions of each vessel. Bilateral testing is considered an integral part of a complete examination. Limited examinations for reoccurring indications may be performed as noted.  +-----+---------------+---------+-----------+----------+-------+ RIGHTCompressibilityPhasicitySpontaneityPropertiesSummary +-----+---------------+---------+-----------+----------+-------+ CFV  Full           Yes      Yes                          +-----+---------------+---------+-----------+----------+-------+   +---------+---------------+---------+-----------+----------+-------+ LEFT     CompressibilityPhasicitySpontaneityPropertiesSummary +---------+---------------+---------+-----------+----------+-------+ CFV      Full           Yes      Yes                          +---------+---------------+---------+-----------+----------+-------+ SFJ      Full                                                 +---------+---------------+---------+-----------+----------+-------+ FV Prox  Full                                                 +---------+---------------+---------+-----------+----------+-------+ FV Mid   Full                                                 +---------+---------------+---------+-----------+----------+-------+  FV DistalFull                                                 +---------+---------------+---------+-----------+----------+-------+ PFV      Full                                                 +---------+---------------+---------+-----------+----------+-------+ POP      Full           Yes      Yes                          +---------+---------------+---------+-----------+----------+-------+ PTV      Full                                                 +---------+---------------+---------+-----------+----------+-------+ PERO     Full                                                  +---------+---------------+---------+-----------+----------+-------+     Summary: Right: No evidence of common femoral vein obstruction. Left: There is no evidence of deep vein thrombosis in the lower extremity. No cystic structure found in the popliteal fossa.  *See table(s) above for measurements and observations. Electronically signed by Ruta Hinds MD on 03/21/2019 at 6:01:53 PM.    Final      Antimicrobials:   Azithromycin and ceftriaxone > 7/26 day 3   Subjective: Patient significantly improved this morning. Off BiPAP answering all questions appropriately Plan for transfer out of the ICU  Objective: Vitals:   03/22/19 0349 03/22/19 0407 03/22/19 0745 03/22/19 0800  BP: 133/61     Pulse: 61     Resp: (!) 24     Temp: 98.5 F (36.9 C)   (!) 97.5 F (36.4 C)  TempSrc: Axillary   Axillary  SpO2: 100%  96%   Weight:  (!) 137.1 kg    Height:        Intake/Output Summary (Last 24 hours) at 03/22/2019 1115 Last data filed at 03/21/2019 1900 Gross per 24 hour  Intake 220 ml  Output 615 ml  Net -395 ml   Filed Weights   03/20/19 2116 03/21/19 0429 03/22/19 0407  Weight: 127 kg (!) 137 kg (!) 137.1 kg    Examination:  General exam:  Awake, alert, answers questions appropriately, significant improvement from prior exam  Respiratory system: mild rhonchi bilat, trace wheezing, again significant improvement Cardiovascular system: S1 & S2 heard, RRR. No JVD, murmurs, rubs, gallops or clicks. No pedal edema. Gastrointestinal system: Abdomen is nondistended, soft and nontender. No organomegaly or masses felt. Normal bowel sounds heard. Central nervous system:  Awake, oriented x3,. No focal neurological deficits. Extremities: Symmetric 5 x 5 strength, WWP. Skin: No rashes, lesions or ulcers Psychiatry: Judgement and insight stable, mood and affect appropriate    Data Reviewed: I have personally reviewed following labs and imaging studies  CBC:  Recent Labs  Lab 03/20/19  2147 03/21/19 0244 03/22/19 0809  WBC 13.3* 11.4* 9.5  NEUTROABS 12.0*  --  7.4  HGB 14.2 13.9 12.4*  HCT 43.2 42.5 36.4*  MCV 99.5 100.7* 100.0  PLT 84* 75* 71*   Basic Metabolic Panel: Recent Labs  Lab 03/20/19 2147 03/21/19 0244 03/22/19 0809  NA 137 139 138  K 3.4* 3.5 3.2*  CL 105 107 107  CO2 22 20* 25  GLUCOSE 117* 119* 98  BUN 13 14 17   CREATININE 1.28* 1.14 0.98  CALCIUM 8.6* 8.2* 8.2*  MG  --  1.6*  --    GFR: Estimated Creatinine Clearance: 89.3 mL/min (by C-G formula based on SCr of 0.98 mg/dL). Liver Function Tests: Recent Labs  Lab 03/20/19 2147  AST 29  ALT 26  ALKPHOS 92  BILITOT 2.2*  PROT 8.2*  ALBUMIN 3.6   No results for input(s): LIPASE, AMYLASE in the last 168 hours. No results for input(s): AMMONIA in the last 168 hours. Coagulation Profile: No results for input(s): INR, PROTIME in the last 168 hours. Cardiac Enzymes: No results for input(s): CKTOTAL, CKMB, CKMBINDEX, TROPONINI in the last 168 hours. BNP (last 3 results) No results for input(s): PROBNP in the last 8760 hours. HbA1C: No results for input(s): HGBA1C in the last 72 hours. CBG: No results for input(s): GLUCAP in the last 168 hours. Lipid Profile: No results for input(s): CHOL, HDL, LDLCALC, TRIG, CHOLHDL, LDLDIRECT in the last 72 hours. Thyroid Function Tests: No results for input(s): TSH, T4TOTAL, FREET4, T3FREE, THYROIDAB in the last 72 hours. Anemia Panel: No results for input(s): VITAMINB12, FOLATE, FERRITIN, TIBC, IRON, RETICCTPCT in the last 72 hours. Sepsis Labs: Recent Labs  Lab 03/20/19 2147 03/20/19 2353 03/21/19 0244  PROCALCITON  --   --  4.24  LATICACIDVEN 3.2* 2.4*  --     Recent Results (from the past 240 hour(s))  Blood culture (routine x 2)     Status: None (Preliminary result)   Collection Time: 03/20/19  9:47 PM   Specimen: BLOOD  Result Value Ref Range Status   Specimen Description   Final    BLOOD LEFT ANTECUBITAL Performed at [facility], [place] [address], [place], [place] [address]    Special Requests   Final    BOTTLES DRAWN AEROBIC AND ANAEROBIC Blood Culture adequate volume Performed at [place] [address], [place], [place] [address]    Culture  Setup Time   Final    AEROBIC BOTTLE ONLY GRAM NEGATIVE RODS CRITICAL VALUE NOTED.  VALUE IS CONSISTENT WITH PREVIOUSLY REPORTED AND CALLED VALUE. Performed at [facility], [place] [address], [place], [place] [address]    Culture GRAM NEGATIVE RODS  Final   Report Status PENDING  Incomplete  SARS Coronavirus 2 (CEPHEID - Performed in [place] hospital lab), Hosp Order     Status: None   Collection Time: 03/20/19  9:47 PM   Specimen: Nasopharyngeal Swab  Result Value Ref Range Status   SARS Coronavirus 2 NEGATIVE NEGATIVE Final    Comment: (NOTE) If result is NEGATIVE SARS-CoV-2 target nucleic acids are NOT DETECTED. The SARS-CoV-2 RNA is generally detectable in upper and lower  respiratory specimens during the acute phase of infection. The lowest  concentration of SARS-CoV-2 viral copies this assay can detect is 250  copies / mL. A negative result does not preclude SARS-CoV-2 infection  and should not be used as the sole basis for treatment  or other  patient management decisions.  A negative result may occur with  improper specimen collection / handling, submission of specimen other  than nasopharyngeal swab, presence of viral mutation(s) within the  areas targeted by this assay, and inadequate number of viral copies  (<250 copies / mL). A negative result must be combined with clinical  observations, patient history, and epidemiological information. If result is POSITIVE SARS-CoV-2 target nucleic acids are DETECTED. The SARS-CoV-2 RNA is generally detectable in upper and lower  respiratory specimens dur ing the acute phase of infection.  Positive  results are indicative of active infection with SARS-CoV-2.   Clinical  correlation with patient history and other diagnostic information is  necessary to determine patient infection status.  Positive results do  not rule out bacterial infection or co-infection with other viruses. If result is PRESUMPTIVE POSTIVE SARS-CoV-2 nucleic acids MAY BE PRESENT.   A presumptive positive result was obtained on the submitted specimen  and confirmed on repeat testing.  While 2019 novel coronavirus  (SARS-CoV-2) nucleic acids may be present in the submitted sample  additional confirmatory testing may be necessary for epidemiological  and / or clinical management purposes  to differentiate between  SARS-CoV-2 and other Sarbecovirus currently known to infect humans.  If clinically indicated additional testing with an alternate test  methodology [phone]) is advised. The SARS-CoV-2 RNA is generally  detectable in upper and lower respiratory sp ecimens during the acute  phase of infection. The expected result is Negative. Fact Sheet for Patients:  StrictlyIdeas.no Fact Sheet for Healthcare Providers: BankingDealers.co.za This test is not yet approved or cleared by the [place] FDA and has been authorized for detection and/or diagnosis of SARS-CoV-2 by FDA under an Emergency Use Authorization (EUA).  This EUA will remain in effect (meaning this test can be used) for the duration of the COVID-19 declaration under Section 564(b)(1) of the Act, 21 U.S.C. section 360bbb-3(b)(1), unless the authorization is terminated or revoked sooner. Performed at [facility], [place] [address] [place], [place] [address]   Blood culture (routine x 2)     Status: None (Preliminary result)   Collection Time: 03/20/19  9:52 PM   Specimen: BLOOD LEFT FOREARM  Result Value Ref Range Status   Specimen Description   Final    BLOOD LEFT FOREARM Performed at [place] [address],  [place], [place] [address]    Special Requests   Final    BOTTLES DRAWN AEROBIC AND ANAEROBIC Blood Culture results may not be optimal due to an inadequate volume of blood received in culture bottles Performed at [place] [address] [place], [place] [address]    Culture  Setup Time   Final    IN BOTH AEROBIC AND ANAEROBIC BOTTLES GRAM NEGATIVE RODS CRITICAL RESULT CALLED TO, READ BACK BY AND VERIFIED WITH: Lavell Luster [facility] 03/22/19 0106 JDW Performed at [facility], [place] [address] [place], [place] [address]    Culture GRAM NEGATIVE RODS  Final   Report Status PENDING  Incomplete  Blood Culture ID Panel (Reflexed)     Status: None   Collection Time: 03/20/19  9:52 PM  Result Value Ref Range Status   Enterococcus species NOT DETECTED NOT DETECTED Final   Listeria monocytogenes NOT DETECTED NOT DETECTED Final   Staphylococcus species NOT DETECTED NOT DETECTED Final   Staphylococcus aureus (BCID) NOT DETECTED NOT DETECTED Final   Streptococcus species NOT DETECTED NOT DETECTED Final  Streptococcus agalactiae NOT DETECTED NOT DETECTED Final   Streptococcus pneumoniae NOT DETECTED NOT DETECTED Final   Streptococcus pyogenes NOT DETECTED NOT DETECTED Final   Acinetobacter baumannii NOT DETECTED NOT DETECTED Final   Enterobacteriaceae species NOT DETECTED NOT DETECTED Final   Enterobacter cloacae complex NOT DETECTED NOT DETECTED Final   Escherichia coli NOT DETECTED NOT DETECTED Final   Klebsiella oxytoca NOT DETECTED NOT DETECTED Final   Klebsiella pneumoniae NOT DETECTED NOT DETECTED Final   Proteus species NOT DETECTED NOT DETECTED Final   Serratia marcescens NOT DETECTED NOT DETECTED Final   Haemophilus influenzae NOT DETECTED NOT DETECTED Final   Neisseria meningitidis NOT DETECTED NOT DETECTED Final   Pseudomonas aeruginosa NOT DETECTED NOT DETECTED Final   Candida albicans NOT DETECTED NOT DETECTED Final   Candida glabrata NOT DETECTED NOT DETECTED  Final   Candida krusei NOT DETECTED NOT DETECTED Final   Candida parapsilosis NOT DETECTED NOT DETECTED Final   Candida tropicalis NOT DETECTED NOT DETECTED Final    Comment: Performed at [facility], [place] [address] [place], [place] [address]  Urine culture     Status: Abnormal   Collection Time: 03/20/19 10:52 PM   Specimen: Urine, Random  Result Value Ref Range Status   Specimen Description   Final    URINE, RANDOM Performed at [place] [address], [place], [place] [address]    Special Requests   Final    NONE Performed at [facility], [place] [address] [place], [place] [address]    Culture (A)  Final    <10,000 COLONIES/mL INSIGNIFICANT GROWTH Performed at [place] [address], [place], [place] [address]    Report Status 03/22/2019 FINAL  Final  MRSA PCR Screening     Status: Abnormal   Collection Time: 03/21/19  1:45 AM   Specimen: Nasal Mucosa; Nasopharyngeal  Result Value Ref Range Status   MRSA by PCR POSITIVE (A) NEGATIVE Final    Comment:        The GeneXpert MRSA Assay (FDA approved for NASAL specimens only), is one component of a comprehensive MRSA colonization surveillance program. It is not intended to diagnose MRSA infection nor to guide or monitor treatment for MRSA infections. RESULT CALLED TO, READ BACK BY AND VERIFIED WITH: JOHNSON AT 0317 ON 03/21/2019 BY JPM Performed at [place] [address], [place], [place] [address]          Radiology Studies: Dg Chest Port 1 View  Result Date: 03/21/2019 CLINICAL DATA:  Respiratory failure with hypoxia. EXAM: PORTABLE CHEST 1 VIEW COMPARISON:  03/20/2019; 07/10/2017; CT abdomen pelvis - 11/30/2018 FINDINGS: Grossly unchanged enlarged cardiac silhouette and mediastinal contours with atherosclerotic plaque within thoracic aorta. There is persistent thickening the right paratracheal stripe presumably secondary prominent  vasculature. The pulmonary vasculature remains indistinct with cephalization of flow. Suspected development of small bilateral effusions with associated worsening bibasilar heterogeneous/consolidative opacities. No pneumothorax. No acute osseous abnormalities. IMPRESSION: Cardiomegaly with findings suggestive of pulmonary edema with suspected trace bilateral effusions and worsening bibasilar opacities, atelectasis versus infiltrate. Further evaluation with a PA and lateral chest radiograph may be obtained as clinically indicated. Electronically Signed   By: Sandi Mariscal M.D.   On: 03/21/2019 07:36   Dg Chest Portable 1 View  Result Date: 03/20/2019 CLINICAL DATA:  Fever EXAM: PORTABLE CHEST 1 VIEW COMPARISON:  07/10/2017 FINDINGS: Cardiomegaly with central vascular congestion. Low lung volumes. Patchy opacity at the left base.  No pneumothorax. IMPRESSION: 1. Low lung volumes. Patchy atelectasis or mild infiltrate at the left base 2. Cardiomegaly with mild central congestion Electronically Signed   By: Donavan Foil M.D.   On: 03/20/2019 22:19   Vas [place] Lower Extremity Venous (dvt)  Result Date: 03/21/2019  Lower Venous Study Indications: Swelling.  Risk Factors: None identified. Limitations: Body habitus, poor ultrasound/tissue interface and skin changes. Comparison Study: No prior studies. Performing Technologist: Oliver Hum RVT  Examination Guidelines: A complete evaluation includes B-mode imaging, spectral Doppler, color Doppler, and power Doppler as needed of all accessible portions of each vessel. Bilateral testing is considered an integral part of a complete examination. Limited examinations for reoccurring indications may be performed as noted.  +-----+---------------+---------+-----------+----------+-------+ RIGHTCompressibilityPhasicitySpontaneityPropertiesSummary +-----+---------------+---------+-----------+----------+-------+ CFV  Full           Yes      Yes                           +-----+---------------+---------+-----------+----------+-------+   +---------+---------------+---------+-----------+----------+-------+ LEFT     CompressibilityPhasicitySpontaneityPropertiesSummary +---------+---------------+---------+-----------+----------+-------+ CFV      Full           Yes      Yes                          +---------+---------------+---------+-----------+----------+-------+ SFJ      Full                                                 +---------+---------------+---------+-----------+----------+-------+ FV Prox  Full                                                 +---------+---------------+---------+-----------+----------+-------+ FV Mid   Full                                                 +---------+---------------+---------+-----------+----------+-------+ FV DistalFull                                                 +---------+---------------+---------+-----------+----------+-------+ PFV      Full                                                 +---------+---------------+---------+-----------+----------+-------+ POP      Full           Yes      Yes                          +---------+---------------+---------+-----------+----------+-------+ PTV      Full                                                 +---------+---------------+---------+-----------+----------+-------+  PERO     Full                                                 +---------+---------------+---------+-----------+----------+-------+     Summary: Right: No evidence of common femoral vein obstruction. Left: There is no evidence of deep vein thrombosis in the lower extremity. No cystic structure found in the popliteal fossa.  *See table(s) above for measurements and observations. Electronically signed by Ruta Hinds MD on 03/21/2019 at 6:01:53 PM.    Final         Scheduled Meds: . azithromycin  500 mg Oral Daily  . chlorhexidine  15 mL Mouth Rinse  BID  . Chlorhexidine Gluconate Cloth  6 each Topical Daily  . mouth rinse  15 mL Mouth Rinse q12n4p  . mupirocin ointment  1 application Nasal BID   Continuous Infusions: . cefTRIAXone (ROCEPHIN)  IV Stopped (03/21/19 0558)     LOS: 1 day    Time spent: [place]    Nicolette Bang, MD [facility]  If 7PM-7AM, please contact night-coverage  03/22/2019, 11:15 AM

## 2019-03-22 NOTE — Progress Notes (Addendum)
Pt educated in regards to the benefits of wearing nocturnal bipap, but pt adamantly refused.   RN notified.  Pt remains on 2lnc.  Pt was encouraged to call should he change his mind.

## 2019-03-22 NOTE — Progress Notes (Signed)
Eric Schultz [id number] transferred to 1439 with NT, o2 2l and tele. No change in condition. Report given to [facility].

## 2019-03-22 NOTE — Progress Notes (Signed)
PHARMACY - PHYSICIAN COMMUNICATION CRITICAL VALUE ALERT - BLOOD CULTURE IDENTIFICATION (BCID)  Eric Schultz is an 78 y.o. male who presented to [facility] on 03/20/2019 with a chief complaint of SOB, fever.  Assessment:  Patient on Azithromycin, Ceftriaxone for CAP.(include suspected source if known)  Name of physician (or Provider) Contacted: K. Schorr  Current antibiotics: Azithromycin, Ceftriaxone   Changes to prescribed antibiotics recommended:  Have 1st shift team address.  Micro called to say GNR on gram stain but no bug noted.  Micro is NOT say this is contaminate and is NOT saying nothing found on Gram stain.  Results for orders placed or performed during the hospital encounter of 03/20/19  Blood Culture ID Panel (Reflexed) (Collected: 03/20/2019  9:52 PM)  Result Value Ref Range   Enterococcus species NOT DETECTED NOT DETECTED   Listeria monocytogenes NOT DETECTED NOT DETECTED   Staphylococcus species NOT DETECTED NOT DETECTED   Staphylococcus aureus (BCID) NOT DETECTED NOT DETECTED   Streptococcus species NOT DETECTED NOT DETECTED   Streptococcus agalactiae NOT DETECTED NOT DETECTED   Streptococcus pneumoniae NOT DETECTED NOT DETECTED   Streptococcus pyogenes NOT DETECTED NOT DETECTED   Acinetobacter baumannii NOT DETECTED NOT DETECTED   Enterobacteriaceae species NOT DETECTED NOT DETECTED   Enterobacter cloacae complex NOT DETECTED NOT DETECTED   Escherichia coli NOT DETECTED NOT DETECTED   Klebsiella oxytoca NOT DETECTED NOT DETECTED   Klebsiella pneumoniae NOT DETECTED NOT DETECTED   Proteus species NOT DETECTED NOT DETECTED   Serratia marcescens NOT DETECTED NOT DETECTED   Haemophilus influenzae NOT DETECTED NOT DETECTED   Neisseria meningitidis NOT DETECTED NOT DETECTED   Pseudomonas aeruginosa NOT DETECTED NOT DETECTED   Candida albicans NOT DETECTED NOT DETECTED   Candida glabrata NOT DETECTED NOT DETECTED   Candida krusei NOT DETECTED NOT DETECTED   Candida  parapsilosis NOT DETECTED NOT DETECTED   Candida tropicalis NOT DETECTED NOT DETECTED    [name] 03/22/2019  5:59 AM

## 2019-03-23 ENCOUNTER — Inpatient Hospital Stay (HOSPITAL_COMMUNITY): Payer: Medicare PPO

## 2019-03-23 LAB — CBC WITH DIFFERENTIAL/PLATELET
Abs Immature Granulocytes: 0.03 10*3/uL (ref 0.00–0.07)
Basophils Absolute: 0 10*3/uL (ref 0.0–0.1)
Basophils Relative: 0 %
Eosinophils Absolute: 0.2 10*3/uL (ref 0.0–0.5)
Eosinophils Relative: 4 %
HCT: 36.9 % — ABNORMAL LOW (ref 39.0–52.0)
Hemoglobin: 12.6 g/dL — ABNORMAL LOW (ref 13.0–17.0)
Immature Granulocytes: 1 %
Lymphocytes Relative: 14 %
Lymphs Abs: 0.9 10*3/uL (ref 0.7–4.0)
MCH: 33.6 pg (ref 26.0–34.0)
MCHC: 34.1 g/dL (ref 30.0–36.0)
MCV: 98.4 fL (ref 80.0–100.0)
Monocytes Absolute: 0.6 10*3/uL (ref 0.1–1.0)
Monocytes Relative: 9 %
Neutro Abs: 4.4 10*3/uL (ref 1.7–7.7)
Neutrophils Relative %: 72 %
Platelets: 74 10*3/uL — ABNORMAL LOW (ref 150–400)
RBC: 3.75 MIL/uL — ABNORMAL LOW (ref 4.22–5.81)
RDW: 13.5 % (ref 11.5–15.5)
WBC: 6.2 10*3/uL (ref 4.0–10.5)
nRBC: 0 % (ref 0.0–0.2)

## 2019-03-23 LAB — BASIC METABOLIC PANEL
Anion gap: 9 (ref 5–15)
BUN: 16 mg/dL (ref 8–23)
CO2: 22 mmol/L (ref 22–32)
Calcium: 7.9 mg/dL — ABNORMAL LOW (ref 8.9–10.3)
Chloride: 107 mmol/L (ref 98–111)
Creatinine, Ser: 0.82 mg/dL (ref 0.61–1.24)
GFR calc Af Amer: >60 mL/min (ref >60)
GFR calc non Af Amer: >60 mL/min (ref >60)
Glucose, Bld: 99 mg/dL (ref 70–99)
Potassium: 3.4 mmol/L — ABNORMAL LOW (ref 3.5–5.1)
Sodium: 138 mmol/L (ref 135–145)

## 2019-03-23 MED ORDER — FUROSEMIDE 10 MG/ML IJ SOLN
40.0000 mg | Freq: Once | INTRAMUSCULAR | Status: AC
  Administered 2019-03-23: 40 mg via INTRAVENOUS
  Filled 2019-03-23: qty 4

## 2019-03-23 NOTE — Progress Notes (Signed)
Patient continues to decline nocturnal BiPAP. Equipment remains at bedside in the event the patient becomes more compliant.

## 2019-03-23 NOTE — Progress Notes (Signed)
PROGRESS NOTE    Eric Schultz  ZOX:[id number] DOB: July 06, 1941 DOA: 03/20/2019 [facility]   Brief Narrative:  Per admitting history and physical Eric Schultz 78 y.o.malewith medical history significant forhypertension, gout, thrombocytopenia, OSA, chronic venous stasis dermatitis of both lower extremities who presents to the ED for evaluation of shortness of breath. Patient states he had new onset shortness of breath and cough productive of scant yellow sputum, generalized weakness beginning 2 days ago. He has chronic swelling and dermatitis changes in both of his legs which he says has been going on for many years. He has had subjective fevers and poor appetite. He was with his brother and has no sick contacts that he is aware of. He denies any recent travel. He states he does not take any medications regularly. He denied any chest pain, palpitations, abdominal pain, dysuria, diarrhea.  While in the hospital patient was treated for respiratory failure with hypoxia secondary to left-sided pneumonia, was found to have Eric Schultz gram-negative bacteremia as well as chronic cellulitis left foot with chronic venous stasis dermatitis of both lower extremities.  Hospital course as below  Assessment & Plan:   Principal Problem:   Acute on chronic respiratory failure with hypoxia (HCC) Active Problems:   Hypokalemia   Community acquired pneumonia   Cellulitis of foot, left   Chronic venous stasis dermatitis of both lower extremities   AKI (acute kidney injury) ([place])  Gram-negative rod bacteremia. Continue current antibiotics awaiting speciation with sensitivities for abx adjustment White blood cells 9.5, hemodynamically stable Blood cx from 7/26 pending Urine cx with <10,000 CFU's  Sepsis with acute respiratory failure with hypoxia and PNA: As previously noted, chest x-ray with possible left sided pneumonia. Patient presenting with shortness of breath, cough  productive of scant yellow sputum, leukocytosis, and fever. Willcont totreat as community acquired pneumonia. Also possible contribution of HF exacerbation. -Continue IV ceftriaxone and azithromycin -negative urine strep -negative legionella  -Continue supplemental oxygen weaned bipap, on [place] currently -follow CXR intermittently - 2 view today with bibasilar atelectasis -BNP elevated-but nl echo 60-65, no ddx noted  Heart Failure with preserved EF Exacerbation:  Echo with normal EF - 81-19%, LV diastolic parameters normal (see report) Imaging was concerning for pulmonary edema and pt also had elevated BNP Will give dose of lasix x1 and follow response Follow CXR intermittently Will continue to monitor Follow BNP in AM  Cellulitis of left foot with chronic venous stasis dermatitis of both lower extremities: -Continue IV ceftriaxone for now -checked doppler, neg dvt  Acute kidney injury: Patient was given 1 L normal saline in [place] with holding fluids given pulm edema  Hypertension: Borderline here, will continue to follow  Hypokalemia: Oral repletion ordered. Recheck labs in AM  Thrombocytopenia: Chronic and appears near baseline. Unclear if this is been worked up in the past. No active bleeding noted. Hold pharmacologic VTE prophylaxis. Will need outpatient follow up  OSA: Not using CPAP at home-BUT NEEDS TO - did well with it in the hospital Continue supplemental O2 as needed.  DVT prophylaxis: SCD Code Status: full  Family Communication: none at bedside Disposition Plan: pending further improvement, identification of pending blood cx and sensitivitis   Consultants:   none  Procedures:  Echo IMPRESSIONS    1. The left ventricle has normal systolic function with an ejection fraction of 60-65%. The cavity size was mildly dilated. Left ventricular diastolic parameters were normal.  2. The right ventricle has normal systolic function. The cavity  was normal. There is no increase in right ventricular wall thickness.  3. Left atrial size was moderately dilated.  4. The aortic valve is tricuspid. Moderate thickening of the aortic valve. Moderate calcification of the aortic valve. Mild stenosis of the aortic valve.  5. The aorta is normal in size and structure.  LLE [place] Summary: Right: No evidence of common femoral vein obstruction. Left: There is no evidence of deep vein thrombosis in the lower extremity. No cystic structure found in the popliteal fossa.  Antimicrobials:  Anti-infectives (From admission, onward)   Start     Dose/Rate Route Frequency Ordered Stop   03/22/19 1000  azithromycin (ZITHROMAX) tablet 500 mg     500 mg Oral Daily 03/21/19 1147 03/26/19 0959   03/21/19 0600  cefTRIAXone (ROCEPHIN) 2 g in sodium chloride 0.9 % 100 mL IVPB     2 g 200 mL/hr over 30 Minutes Intravenous Daily 03/21/19 0102 03/26/19 0759   03/21/19 0115  azithromycin (ZITHROMAX) 500 mg in sodium chloride 0.9 % 250 mL IVPB  Status:  Discontinued     500 mg 250 mL/hr over 60 Minutes Intravenous Daily at bedtime 03/21/19 0102 03/21/19 1147   03/20/19 2300  ceFEPIme (MAXIPIME) 1 g in sodium chloride 0.9 % 100 mL IVPB     1 g 200 mL/hr over 30 Minutes Intravenous  Once 03/20/19 2247 03/20/19 2353   03/20/19 2300  vancomycin (VANCOCIN) 2,000 mg in sodium chloride 0.9 % 500 mL IVPB     2,000 mg 250 mL/hr over 120 Minutes Intravenous  Once 03/20/19 2247 03/20/19 2316      Subjective: Notes concern about ability to afford hospitalization Concern about cost of stay Discuss importance of hospital stay to further evaluate and treat bacteremia  Objective: Vitals:   03/22/19 1956 03/23/19 0500 03/23/19 0602 03/23/19 1358  BP: (!) 143/78  (!) 148/67 (!) 142/70  Pulse: 64  67 62  Resp: 20  20 20   Temp: 98.1 F (36.7 C)  98 F (36.7 C) 97.8 F (36.6 C)  TempSrc: Oral  Oral Oral  SpO2: 98%  92% 96%  Weight:  (!) 138.3 kg    Height:         Intake/Output Summary (Last 24 hours) at 03/23/2019 1903 Last data filed at 03/23/2019 1227 Gross per 24 hour  Intake 360 ml  Output 1300 ml  Net -940 ml   Filed Weights   03/21/19 0429 03/22/19 0407 03/23/19 0500  Weight: (!) 137 kg (!) 137.1 kg (!) 138.3 kg    Examination:  General exam: Appears calm and comfortable  Respiratory system: Clear to auscultation. Respiratory effort normal. Cardiovascular system: S1 & S2 heard, RRR. Gastrointestinal system: Abdomen is nondistended, soft and nontender.  Central nervous system: Alert and oriented. No focal neurological deficits. Extremities: left lower extremity edema > RLE (pt notes this is chronic).  Mild lower extremity erythema. Psychiatry: Judgement and insight appear normal. Mood & affect appropriate.     Data Reviewed: I have personally reviewed following labs and imaging studies  CBC: Recent Labs  Lab 03/20/19 2147 03/21/19 0244 03/22/19 0809 03/23/19 0436  WBC 13.3* 11.4* 9.5 6.2  NEUTROABS 12.0*  --  7.4 4.4  HGB 14.2 13.9 12.4* 12.6*  HCT 43.2 42.5 36.4* 36.9*  MCV 99.5 100.7* 100.0 98.4  PLT 84* 75* 71* 74*   Basic Metabolic Panel: Recent Labs  Lab 03/20/19 2147 03/21/19 0244 03/22/19 0809 03/23/19 0436  NA 137 139 138 138  K 3.4* 3.5  3.2* 3.4*  CL 105 107 107 107  CO2 22 20* 25 22  GLUCOSE 117* 119* 98 99  BUN 13 14 17 16   CREATININE 1.28* 1.14 0.98 0.82  CALCIUM 8.6* 8.2* 8.2* 7.9*  MG  --  1.6*  --   --    GFR: Estimated Creatinine Clearance: 107.2 mL/min (by C-G formula based on SCr of 0.82 mg/dL). Liver Function Tests: Recent Labs  Lab 03/20/19 2147  AST 29  ALT 26  ALKPHOS 92  BILITOT 2.2*  PROT 8.2*  ALBUMIN 3.6   No results for input(s): LIPASE, AMYLASE in the last 168 hours. No results for input(s): AMMONIA in the last 168 hours. Coagulation Profile: No results for input(s): INR, PROTIME in the last 168 hours. Cardiac Enzymes: No results for input(s): CKTOTAL, CKMB, CKMBINDEX,  TROPONINI in the last 168 hours. BNP (last 3 results) No results for input(s): PROBNP in the last 8760 hours. HbA1C: No results for input(s): HGBA1C in the last 72 hours. CBG: No results for input(s): GLUCAP in the last 168 hours. Lipid Profile: No results for input(s): CHOL, HDL, LDLCALC, TRIG, CHOLHDL, LDLDIRECT in the last 72 hours. Thyroid Function Tests: No results for input(s): TSH, T4TOTAL, FREET4, T3FREE, THYROIDAB in the last 72 hours. Anemia Panel: No results for input(s): VITAMINB12, FOLATE, FERRITIN, TIBC, IRON, RETICCTPCT in the last 72 hours. Sepsis Labs: Recent Labs  Lab 03/20/19 2147 03/20/19 2353 03/21/19 0244  PROCALCITON  --   --  4.24  LATICACIDVEN 3.2* 2.4*  --     Recent Results (from the past 240 hour(s))  Blood culture (routine x 2)     Status: None (Preliminary result)   Collection Time: 03/20/19  9:47 PM   Specimen: BLOOD  Result Value Ref Range Status   Specimen Description   Final    BLOOD LEFT ANTECUBITAL Performed at [facility], [place] [address], [place], [place] [address]    Special Requests   Final    BOTTLES DRAWN AEROBIC AND ANAEROBIC Blood Culture adequate volume Performed at [place] [address] [place], [place] [address]    Culture  Setup Time   Final    AEROBIC BOTTLE ONLY GRAM NEGATIVE RODS CRITICAL VALUE NOTED.  VALUE IS CONSISTENT WITH PREVIOUSLY REPORTED AND CALLED VALUE. Performed at [facility], [place] [address], [place], [place] [address]    Culture GRAM NEGATIVE RODS  Final   Report Status PENDING  Incomplete  SARS Coronavirus 2 (CEPHEID - Performed in [place] hospital lab), Hosp Order     Status: None   Collection Time: 03/20/19  9:47 PM   Specimen: Nasopharyngeal Swab  Result Value Ref Range Status   SARS Coronavirus 2 NEGATIVE NEGATIVE Final    Comment: (NOTE) If result is NEGATIVE SARS-CoV-2 target nucleic acids are NOT DETECTED. The SARS-CoV-2 RNA is generally  detectable in upper and lower  respiratory specimens during the acute phase of infection. The lowest  concentration of SARS-CoV-2 viral copies this assay can detect is 250  copies / mL. Anubis Fundora negative result does not preclude SARS-CoV-2 infection  and should not be used as the sole basis for treatment or other  patient management decisions.  Qiara Minetti negative result may occur with  improper specimen collection / handling, submission of specimen other  than nasopharyngeal swab, presence of viral mutation(s) within the  areas targeted by this assay, and inadequate number of viral copies  (<250 copies / mL). Chava Dulac negative result must be combined  with clinical  observations, patient history, and epidemiological information. If result is POSITIVE SARS-CoV-2 target nucleic acids are DETECTED. The SARS-CoV-2 RNA is generally detectable in upper and lower  respiratory specimens dur ing the acute phase of infection.  Positive  results are indicative of active infection with SARS-CoV-2.  Clinical  correlation with patient history and other diagnostic information is  necessary to determine patient infection status.  Positive results do  not rule out bacterial infection or co-infection with other viruses. If result is PRESUMPTIVE POSTIVE SARS-CoV-2 nucleic acids MAY BE PRESENT.   Jahlia Omura presumptive positive result was obtained on the submitted specimen  and confirmed on repeat testing.  While 2019 novel coronavirus  (SARS-CoV-2) nucleic acids may be present in the submitted sample  additional confirmatory testing may be necessary for epidemiological  and / or clinical management purposes  to differentiate between  SARS-CoV-2 and other Sarbecovirus currently known to infect humans.  If clinically indicated additional testing with an alternate test  methodology [id number]) is advised. The SARS-CoV-2 RNA is generally  detectable in upper and lower respiratory sp ecimens during the acute  phase of infection. The  expected result is Negative. Fact Sheet for Patients:  StrictlyIdeas.no Fact Sheet for Healthcare Providers: BankingDealers.co.za This test is not yet approved or cleared by the [place] FDA and has been authorized for detection and/or diagnosis of SARS-CoV-2 by FDA under an Emergency Use Authorization (EUA).  This EUA will remain in effect (meaning this test can be used) for the duration of the COVID-19 declaration under Section 564(b)(1) of the Act, 21 U.S.C. section 360bbb-3(b)(1), unless the authorization is terminated or revoked sooner. Performed at [facility], [place] [address], [place], [place] [address]   Blood culture (routine x 2)     Status: None (Preliminary result)   Collection Time: 03/20/19  9:52 PM   Specimen: BLOOD LEFT FOREARM  Result Value Ref Range Status   Specimen Description   Final    BLOOD LEFT FOREARM Performed at [place] [address] [place], [place] [address]    Special Requests   Final    BOTTLES DRAWN AEROBIC AND ANAEROBIC Blood Culture results may not be optimal due to an inadequate volume of blood received in culture bottles Performed at [place] [address], [place], [place] [address]    Culture  Setup Time   Final    IN BOTH AEROBIC AND ANAEROBIC BOTTLES GRAM NEGATIVE RODS CRITICAL RESULT CALLED TO, READ BACK BY AND VERIFIED WITH: Lavell Luster [facility] 03/22/19 0106 JDW Performed at [facility], [place] [address], [place], [place] [address]    Culture GRAM NEGATIVE RODS  Final   Report Status PENDING  Incomplete  Blood Culture ID Panel (Reflexed)     Status: None   Collection Time: 03/20/19  9:52 PM  Result Value Ref Range Status   Enterococcus species NOT DETECTED NOT DETECTED Final   Listeria monocytogenes NOT DETECTED NOT DETECTED Final   Staphylococcus species NOT DETECTED NOT DETECTED Final   Staphylococcus aureus  (BCID) NOT DETECTED NOT DETECTED Final   Streptococcus species NOT DETECTED NOT DETECTED Final   Streptococcus agalactiae NOT DETECTED NOT DETECTED Final   Streptococcus pneumoniae NOT DETECTED NOT DETECTED Final   Streptococcus pyogenes NOT DETECTED NOT DETECTED Final   Acinetobacter baumannii NOT DETECTED NOT DETECTED Final   Enterobacteriaceae species NOT DETECTED NOT DETECTED Final   Enterobacter cloacae complex NOT DETECTED NOT DETECTED Final  Escherichia coli NOT DETECTED NOT DETECTED Final   Klebsiella oxytoca NOT DETECTED NOT DETECTED Final   Klebsiella pneumoniae NOT DETECTED NOT DETECTED Final   Proteus species NOT DETECTED NOT DETECTED Final   Serratia marcescens NOT DETECTED NOT DETECTED Final   Haemophilus influenzae NOT DETECTED NOT DETECTED Final   Neisseria meningitidis NOT DETECTED NOT DETECTED Final   Pseudomonas aeruginosa NOT DETECTED NOT DETECTED Final   Candida albicans NOT DETECTED NOT DETECTED Final   Candida glabrata NOT DETECTED NOT DETECTED Final   Candida krusei NOT DETECTED NOT DETECTED Final   Candida parapsilosis NOT DETECTED NOT DETECTED Final   Candida tropicalis NOT DETECTED NOT DETECTED Final    Comment: Performed at [facility], [place] [address], [place], [place] [address]  Urine culture     Status: Abnormal   Collection Time: 03/20/19 10:52 PM   Specimen: Urine, Random  Result Value Ref Range Status   Specimen Description   Final    URINE, RANDOM Performed at [place] [address], [place], [place] [address]    Special Requests   Final    NONE Performed at [facility], [place] [address], [place], [place] [address]    Culture (Aubriana Ravelo)  Final    <10,000 COLONIES/mL INSIGNIFICANT GROWTH Performed at [place] [address], [place], [place] [address]    Report Status 03/22/2019 FINAL  Final  MRSA PCR Screening     Status: Abnormal   Collection Time: 03/21/19  1:45 AM   Specimen: Nasal  Mucosa; Nasopharyngeal  Result Value Ref Range Status   MRSA by PCR POSITIVE (Shaheen Mende) NEGATIVE Final    Comment:        The GeneXpert MRSA Assay (FDA approved for NASAL specimens only), is one component of [facility] Berkov[facility] comprehensive MRSA colonization surveillance program. It is not intended to diagnose MRSA infection nor to guide or monitor treatment for MRSA infections. RESULT CALLED TO, READ BACK BY AND VERIFIED WITH: JOHNSON AT 0317 ON 03/21/2019 BY JPM Performed at [place] [address] [place], [place] [address]          Radiology Studies: Dg Chest 2 View  Result Date: 03/23/2019 CLINICAL DATA:  Hypertension EXAM: CHEST - 2 VIEW COMPARISON:  Same-day radiograph FINDINGS: Cardiac silhouette is enlarged, unchanged. Aortic calcified and tortuous. Overall lung aeration slightly improved. Streaky bibasilar opacities, right greater than left, similar to prior. No new focal airspace consolidation, pleural effusion, or pneumothorax. IMPRESSION: Slight improvement in lung aeration compared to prior. Persistent bibasilar atelectasis. Electronically Signed   By: Davina Poke M.D.   On: 03/23/2019 14:51   Dg Chest Port 1 View  Result Date: 03/23/2019 CLINICAL DATA:  Pneumonia, history hypertension EXAM: PORTABLE CHEST 1 VIEW COMPARISON:  Portable exam 0435 hours compared to 03/21/2019 FINDINGS: Severely rotated to the LEFT. Enlargement of cardiac silhouette with vascular congestion. Atherosclerotic calcification aorta. Diffuse interstitial infiltrates greater on RIGHT, favor pulmonary edema. Mild atelectasis at RIGHT base. No definite pleural effusion or pneumothorax. Bones demineralized. IMPRESSION: Probable pulmonary edema, slightly increased. Mild RIGHT basilar atelectasis. Electronically Signed   By: Lavonia Dana M.D.   On: 03/23/2019 08:14        Scheduled Meds: . azithromycin  500 mg Oral Daily  . chlorhexidine  15 mL Mouth Rinse BID  . Chlorhexidine Gluconate  Cloth  6 each Topical Daily  . furosemide  40 mg Intravenous Once  . mouth rinse  15 mL Mouth Rinse  [id number]  . mupirocin ointment  1 application Nasal BID   Continuous Infusions: . cefTRIAXone (ROCEPHIN)  IV 2 g (03/23/19 1227)     LOS: 2 days    Time spent: over 30 min    Fayrene Helper, MD [facility] Pager AMION  If 7PM-7AM, please contact night-coverage [email or web address] Password [email or web address] 03/23/2019, 7:03 PM

## 2019-03-24 DIAGNOSIS — R7881 Bacteremia: Secondary | ICD-10-CM

## 2019-03-24 DIAGNOSIS — F329 Major depressive disorder, single episode, unspecified: Secondary | ICD-10-CM

## 2019-03-24 DIAGNOSIS — B9689 Other specified bacterial agents as the cause of diseases classified elsewhere: Secondary | ICD-10-CM

## 2019-03-24 DIAGNOSIS — I872 Venous insufficiency (chronic) (peripheral): Secondary | ICD-10-CM

## 2019-03-24 DIAGNOSIS — L280 Lichen simplex chronicus: Secondary | ICD-10-CM

## 2019-03-24 DIAGNOSIS — R509 Fever, unspecified: Secondary | ICD-10-CM

## 2019-03-24 DIAGNOSIS — D696 Thrombocytopenia, unspecified: Secondary | ICD-10-CM

## 2019-03-24 DIAGNOSIS — M109 Gout, unspecified: Secondary | ICD-10-CM

## 2019-03-24 DIAGNOSIS — Z881 Allergy status to other antibiotic agents status: Secondary | ICD-10-CM

## 2019-03-24 DIAGNOSIS — D72829 Elevated white blood cell count, unspecified: Secondary | ICD-10-CM

## 2019-03-24 LAB — COMPREHENSIVE METABOLIC PANEL
ALT: 19 U/L (ref 0–44)
AST: 25 U/L (ref 15–41)
Albumin: 3 g/dL — ABNORMAL LOW (ref 3.5–5.0)
Alkaline Phosphatase: 69 U/L (ref 38–126)
Anion gap: 10 (ref 5–15)
BUN: 16 mg/dL (ref 8–23)
CO2: 24 mmol/L (ref 22–32)
Calcium: 8.1 mg/dL — ABNORMAL LOW (ref 8.9–10.3)
Chloride: 106 mmol/L (ref 98–111)
Creatinine, Ser: 0.81 mg/dL (ref 0.61–1.24)
GFR calc Af Amer: >60 mL/min (ref >60)
GFR calc non Af Amer: >60 mL/min (ref >60)
Glucose, Bld: 96 mg/dL (ref 70–99)
Potassium: 3.1 mmol/L — ABNORMAL LOW (ref 3.5–5.1)
Sodium: 140 mmol/L (ref 135–145)
Total Bilirubin: 1 mg/dL (ref 0.3–1.2)
Total Protein: 6.7 g/dL (ref 6.5–8.1)

## 2019-03-24 LAB — CBC
HCT: 38.7 % — ABNORMAL LOW (ref 39.0–52.0)
Hemoglobin: 13.3 g/dL (ref 13.0–17.0)
MCH: 33.8 pg (ref 26.0–34.0)
MCHC: 34.4 g/dL (ref 30.0–36.0)
MCV: 98.5 fL (ref 80.0–100.0)
Platelets: 93 10*3/uL — ABNORMAL LOW (ref 150–400)
RBC: 3.93 MIL/uL — ABNORMAL LOW (ref 4.22–5.81)
RDW: 13.2 % (ref 11.5–15.5)
WBC: 4.5 10*3/uL (ref 4.0–10.5)
nRBC: 0 % (ref 0.0–0.2)

## 2019-03-24 LAB — CULTURE, BLOOD (ROUTINE X 2): Special Requests: ADEQUATE

## 2019-03-24 LAB — URIC ACID: Uric Acid, Serum: 6.8 mg/dL (ref 3.7–8.6)

## 2019-03-24 LAB — MAGNESIUM: Magnesium: 2.3 mg/dL (ref 1.7–2.4)

## 2019-03-24 LAB — BRAIN NATRIURETIC PEPTIDE: B Natriuretic Peptide: 220.6 pg/mL — ABNORMAL HIGH (ref 0.0–100.0)

## 2019-03-24 MED ORDER — INDOMETHACIN 25 MG PO CAPS
25.0000 mg | ORAL_CAPSULE | Freq: Three times a day (TID) | ORAL | Status: DC | PRN
  Administered 2019-03-24 (×2): 25 mg via ORAL
  Filled 2019-03-24 (×3): qty 1

## 2019-03-24 MED ORDER — POTASSIUM CHLORIDE CRYS ER 20 MEQ PO TBCR
40.0000 meq | EXTENDED_RELEASE_TABLET | ORAL | Status: AC
  Administered 2019-03-24 (×2): 40 meq via ORAL
  Filled 2019-03-24 (×2): qty 2

## 2019-03-24 MED ORDER — ALUM & MAG HYDROXIDE-SIMETH 200-200-20 MG/5ML PO SUSP
30.0000 mL | ORAL | Status: DC | PRN
  Administered 2019-03-24: 30 mL via ORAL
  Filled 2019-03-24 (×2): qty 30

## 2019-03-24 MED ORDER — FUROSEMIDE 10 MG/ML IJ SOLN
40.0000 mg | Freq: Once | INTRAMUSCULAR | Status: AC
  Administered 2019-03-24: 40 mg via INTRAVENOUS
  Filled 2019-03-24: qty 4

## 2019-03-24 NOTE — Consult Note (Signed)
[facility]       Reason for Consult: bacteremia    Referring Physician: Fayrene Helper, MD  Principal Problem:   Acute on chronic respiratory failure with hypoxia [facility]) Active Problems:   Hypokalemia   Community acquired pneumonia   Cellulitis of foot, left   Chronic venous stasis dermatitis of both lower extremities   AKI (acute kidney injury) ([place])    azithromycin  500 mg Oral Daily   chlorhexidine  15 mL Mouth Rinse BID   Chlorhexidine Gluconate Cloth  6 each Topical Daily   mouth rinse  15 mL Mouth Rinse q12n4p   mupirocin ointment  1 application Nasal BID   potassium chloride  40 mEq Oral Q4H    Recommendations: 1. Sphingomonas sepsis - No clear source of his bacteremia. This pathogen is rare to cause human disease in immunocompetent hosts, which does raise concern about his immune health and possible hepatopathy. Most often, this arises from a foot wound but no open lesions are observed. As both of his blood cxs were positive for this pathogen and he had documented fever with leukocytosis and lactic acidosis, I do believe he had true sepsis and wound NOT regard this as a simple contaminant. He does have heavy lichenification to both of his LEs but no open wound or abscess is observed. Continue rocephin for now as this would be adequate. Repeat blood cxs x 2 to confirm clearance of his BSI. At the time of hospital d/c, may transition him to PO levaquin 750 mg daily to complete 2 weeks of treatment in total. Tentative ABX d/c date is 04/03/2019.  2. Fever/leukocytosis - both promptly resolved with empiric ABX. Repeat blood cxs as noted above to document clearance of his BSI. Check CBC w/ diff regularly now that WBCs are WNL.  3. Thrombocytopenia - Worrisome for cryptogenic or congestive hepatopathy when viewed in conjunction with his venous stasis dermatitis/chronic LE lymphedema. Will check HCV Ab as he does offer a history of receiving  multiple unsterilized injections while he was in the [employer]. Consider RUQ U/S as an OP to further evaluate as well. Pt denies heavy alcohol consumption now or in the past. Check an HIV Ab as well.  Assessment: The patient is a 78 y/o WM with gout, thrombocytopenia, and chronic venous stasis dermatitis admitted with fever, leukocytosis, and Sphingomonas sepsis.  Antibiotics: Rocephin, day 3 + 1 day of cefepime  HPI: Eric Schultz is a 78 y.o. white male with gout, thrombocytopenia, and chronic venous stasis dermatitis to both LEs admitted on 03/20/2019 for SOB, confusion, and fever. The patient lives with his brother at home, who is the one that called EMS when his issues began. He reports successfully self-quarantining at home over the past several months, mostly venturing out to go to the grocery only. He denies doing this or being outdoors any over the 2-3 weeks PTA though. His CoVID-19 testing was negative on admission. He was found to be febrile at 101 with slight elevation in his WBCs to 13.3 K in the ER. Days later, the patient still poorly recalls events at home that led to his admission. He denies any recent falls or leg injuries from his 2 dogs at home. Both of his initial blood cxs have subsequently grown Sphingomonas paucimobilis. CXR was concerning for CHF vs. LLL PNA. He was empirically treated with vancomycin/cefepime/azithromycin, which has been narrowed to rocephin once his blood cxs returned as positive. Of note, the patient has had  long-standing issues with venous stasis dermatitis (? from congestive vs. cryptogenic hepatopathy as he also has chronic thrombocytopenia). Apparently, there was initially concern for LT leg cellulitis. While he has significant lichenification to both distal LEs, LT > RT, no active cellulitis or concern for abscess is noted at this time. Fever and leukocytosis have both resolved. Fever curve, WBC, Cr, and lactic acid trends, ABX usage, blood cx results, and  imaging all independently reviewed.  Review of Systems:  Review of Systems  Constitutional: Negative for chills, fever and weight loss.  HENT: Negative for congestion, hearing loss, sinus pain and sore throat.   Eyes: Negative for blurred vision, photophobia and discharge.  Respiratory: Positive for shortness of breath. Negative for cough and hemoptysis.   Cardiovascular: Negative for chest pain, palpitations, orthopnea and leg swelling.  Gastrointestinal: Negative for abdominal pain, constipation, diarrhea, heartburn, nausea and vomiting.  Genitourinary: Negative for dysuria, flank pain, frequency and urgency.  Musculoskeletal: Positive for myalgias. Negative for back pain and joint pain.  Skin: Positive for rash. Negative for itching.       Chronic venous stasis to both LEs, LT > RT  Neurological: Positive for weakness. Negative for tremors, seizures and headaches.  Endo/Heme/Allergies: Negative for polydipsia. Does not bruise/bleed easily.  Psychiatric/Behavioral: Positive for depression. Negative for substance abuse. The patient is not nervous/anxious and does not have insomnia.      All other systems reviewed and are negative    Past Medical History:  Diagnosis Date   Cellulitis    left lower leg   Gout    Hypertension    "borderline"   Sleep apnea     Social History   Tobacco Use   Smoking status: Never Smoker   Smokeless tobacco: Never Used  Substance Use Topics   Alcohol use: No   Drug use: No    Family History  Problem Relation Age of Onset   Heart failure Mother    Heart failure Father      Current Facility-Administered Medications:    acetaminophen (TYLENOL) tablet 650 mg, 650 mg, Oral, Q4H PRN, Schorr, Rhetta Mura, NP, 650 mg at 03/21/19 2246   alum & mag hydroxide-simeth (MAALOX/MYLANTA) 200-200-20 MG/5ML suspension 30 mL, 30 mL, Oral, Q4H PRN, Elodia Florence., MD   azithromycin [facility]) tablet 500 mg, 500 mg, Oral, Daily, Eudelia Bunch, RPH, 500 mg at 03/24/19 0931   cefTRIAXone (ROCEPHIN) 2 g in sodium chloride 0.9 % 100 mL IVPB, 2 g, Intravenous, Daily, Zada Finders R, MD, Last Rate: 200 mL/hr at 03/24/19 0938, 2 g at 03/24/19 0938   chlorhexidine (PERIDEX) 0.12 % solution 15 mL, 15 mL, Mouth Rinse, BID, Posey Pronto, Vishal R, MD, 15 mL at 03/24/19 0931   Chlorhexidine Gluconate Cloth 2 % PADS 6 each, 6 each, Topical, Daily, Zada Finders R, MD, 6 each at 03/23/19 1332   MEDLINE mouth rinse, 15 mL, Mouth Rinse, q12n4p, Patel, Vishal R, MD, 15 mL at 03/21/19 1557   mupirocin ointment (BACTROBAN) 2 % 1 application, 1 application, Nasal, BID, Zada Finders R, MD, 1 application at 40/81/44 0932   potassium chloride SA (K-DUR) CR tablet 40 mEq, 40 mEq, Oral, Q4H, Elodia Florence., MD, 40 mEq at 03/24/19 0950  Allergies  Allergen Reactions   Sulfa Antibiotics Nausea And Vomiting    Vitals:   03/23/19 2045 03/24/19 0628  BP: (!) 147/97 (!) 148/81  Pulse: 65 62  Resp: 20 20  Temp: 98.3 F (36.8 C) 98.3 F (  36.8 C)  SpO2: 94% 94%     Physical Exam Gen: pleasant, obese, NAD, A&Ox 3, recall poor at times Head: NCAT, no temporal wasting evident EENT: PERRL, EOMI, MMM, adequate dentition Neck: supple, no JVD CV: NRRR, no murmurs evident but heart sounds distant due to body habitus Pulm: CTA bilaterally except for decreased BS at RT base, no wheeze or retractions Abd: soft, obese, NTND, +BS Extrems:  1+ non-pitting LE edema, 2+ pulses Skin: both LEs with heavy lichenification (LT>RT) but no cellulitis/erythema noted at present, poor skin turgor Neuro: CN II-XII grossly intact, no focal neurologic deficits appreciated, gait was not assessed, A&Ox 3, conversation rather tangential/meandering, recall poor at times  Lab Results  Component Value Date   WBC 4.5 03/24/2019   HGB 13.3 03/24/2019   HCT 38.7 (L) 03/24/2019   MCV 98.5 03/24/2019   PLT 93 (L) 03/24/2019    Lab Results  Component Value Date     CREATININE 0.81 03/24/2019   BUN 16 03/24/2019   NA 140 03/24/2019   K 3.1 (L) 03/24/2019   CL 106 03/24/2019   CO2 24 03/24/2019    Lab Results  Component Value Date   ALT 19 03/24/2019   AST 25 03/24/2019   ALKPHOS 69 03/24/2019     Microbiology: Recent Results (from the past 240 hour(s))  Blood culture (routine x 2)     Status: Abnormal   Collection Time: 03/20/19  9:47 PM   Specimen: BLOOD  Result Value Ref Range Status   Specimen Description   Final    BLOOD LEFT ANTECUBITAL Performed at [facility], [address] [place], [place] [address]    Special Requests   Final    BOTTLES DRAWN AEROBIC AND ANAEROBIC Blood Culture adequate volume Performed at [facility] [address], [place], [place] [address]    Culture  Setup Time   Final    AEROBIC BOTTLE ONLY GRAM NEGATIVE RODS CRITICAL VALUE NOTED.  VALUE IS CONSISTENT WITH PREVIOUSLY REPORTED AND CALLED VALUE.    Culture (A)  Final    SPHINGOMONAS PAUCIMOBILIS SUSCEPTIBILITIES PERFORMED ON PREVIOUS CULTURE WITHIN THE LAST 5 DAYS. Performed at [facility], [place] [address] [place], [place] [address]    Report Status 03/24/2019 FINAL  Final  SARS Coronavirus 2 (CEPHEID - Performed in [place] hospital lab), Hosp Order     Status: None   Collection Time: 03/20/19  9:47 PM   Specimen: Nasopharyngeal Swab  Result Value Ref Range Status   SARS Coronavirus 2 NEGATIVE NEGATIVE Final    Comment: (NOTE) If result is NEGATIVE SARS-CoV-2 target nucleic acids are NOT DETECTED. The SARS-CoV-2 RNA is generally detectable in upper and lower  respiratory specimens during the acute phase of infection. The lowest  concentration of SARS-CoV-2 viral copies this assay can detect is 250  copies / mL. A negative result does not preclude SARS-CoV-2 infection  and should not be used as the sole basis for treatment or other  patient management decisions.  A negative result may occur  with  improper specimen collection / handling, submission of specimen other  than nasopharyngeal swab, presence of viral mutation(s) within the  areas targeted by this assay, and inadequate number of viral copies  (<250 copies / mL). A negative result must be combined with clinical  observations, patient history, and epidemiological information. If result is POSITIVE SARS-CoV-2 target nucleic acids are DETECTED. The SARS-CoV-2 RNA is generally detectable in upper and lower  respiratory specimens dur ing the acute phase of infection.  Positive  results are indicative of active infection with SARS-CoV-2.  Clinical  correlation with patient history and other diagnostic information is  necessary to determine patient infection status.  Positive results do  not rule out bacterial infection or co-infection with other viruses. If result is PRESUMPTIVE POSTIVE SARS-CoV-2 nucleic acids MAY BE PRESENT.   A presumptive positive result was obtained on the submitted specimen  and confirmed on repeat testing.  While 2019 novel coronavirus  (SARS-CoV-2) nucleic acids may be present in the submitted sample  additional confirmatory testing may be necessary for epidemiological  and / or clinical management purposes  to differentiate between  SARS-CoV-2 and other Sarbecovirus currently known to infect humans.  If clinically indicated additional testing with an alternate test  methodology [phone]) is advised. The SARS-CoV-2 RNA is generally  detectable in upper and lower respiratory sp ecimens during the acute  phase of infection. The expected result is Negative. Fact Sheet for Patients:  StrictlyIdeas.no Fact Sheet for Healthcare Providers: BankingDealers.co.za This test is not yet approved or cleared by the [place] FDA and has been authorized for detection and/or diagnosis of SARS-CoV-2 by FDA under an Emergency Use Authorization (EUA).  This EUA  will remain in effect (meaning this test can be used) for the duration of the COVID-19 declaration under Section 564(b)(1) of the Act, 21 U.S.C. section 360bbb-3(b)(1), unless the authorization is terminated or revoked sooner. Performed at [facility], [place] [address], [place], [place] [address]   Blood culture (routine x 2)     Status: Abnormal   Collection Time: 03/20/19  9:52 PM   Specimen: BLOOD LEFT FOREARM  Result Value Ref Range Status   Specimen Description   Final    BLOOD LEFT FOREARM Performed at [facility] [address] [place], [place] [address]    Special Requests   Final    BOTTLES DRAWN AEROBIC AND ANAEROBIC Blood Culture results may not be optimal due to an inadequate volume of blood received in culture bottles Performed at [facility] [address], [place], [place] [address]    Culture  Setup Time   Final    IN BOTH AEROBIC AND ANAEROBIC BOTTLES GRAM NEGATIVE RODS CRITICAL RESULT CALLED TO, READ BACK BY AND VERIFIED WITH: Lavell Luster [facility] 03/22/19 0106 JDW Performed at [facility], [place] [address] [place],  [address]    Culture SPHINGOMONAS PAUCIMOBILIS (A)  Final   Report Status 03/24/2019 FINAL  Final   Organism ID, Bacteria SPHINGOMONAS PAUCIMOBILIS  Final      Susceptibility   Sphingomonas paucimobilis - MIC*    CEFEPIME <=1 SENSITIVE Sensitive     CEFAZOLIN <=4 SENSITIVE Sensitive     GENTAMICIN >=16 RESISTANT Resistant     CIPROFLOXACIN <=0.25 SENSITIVE Sensitive     IMIPENEM <=0.25 SENSITIVE Sensitive     TRIMETH/SULFA <=20 SENSITIVE Sensitive     * SPHINGOMONAS PAUCIMOBILIS  Blood Culture ID Panel (Reflexed)     Status: None   Collection Time: 03/20/19  9:52 PM  Result Value Ref Range Status   Enterococcus species NOT DETECTED NOT DETECTED Final   Listeria monocytogenes NOT DETECTED NOT DETECTED Final   Staphylococcus species NOT DETECTED NOT DETECTED Final    Staphylococcus aureus (BCID) NOT DETECTED NOT DETECTED Final   Streptococcus species NOT DETECTED NOT DETECTED Final   Streptococcus agalactiae NOT DETECTED NOT DETECTED Final   Streptococcus pneumoniae  NOT DETECTED NOT DETECTED Final   Streptococcus pyogenes NOT DETECTED NOT DETECTED Final   Acinetobacter baumannii NOT DETECTED NOT DETECTED Final   Enterobacteriaceae species NOT DETECTED NOT DETECTED Final   Enterobacter cloacae complex NOT DETECTED NOT DETECTED Final   Escherichia coli NOT DETECTED NOT DETECTED Final   Klebsiella oxytoca NOT DETECTED NOT DETECTED Final   Klebsiella pneumoniae NOT DETECTED NOT DETECTED Final   Proteus species NOT DETECTED NOT DETECTED Final   Serratia marcescens NOT DETECTED NOT DETECTED Final   Haemophilus influenzae NOT DETECTED NOT DETECTED Final   Neisseria meningitidis NOT DETECTED NOT DETECTED Final   Pseudomonas aeruginosa NOT DETECTED NOT DETECTED Final   Candida albicans NOT DETECTED NOT DETECTED Final   Candida glabrata NOT DETECTED NOT DETECTED Final   Candida krusei NOT DETECTED NOT DETECTED Final   Candida parapsilosis NOT DETECTED NOT DETECTED Final   Candida tropicalis NOT DETECTED NOT DETECTED Final    Comment: Performed at [facility], [place] [address] [place], [place] [address]  Urine culture     Status: Abnormal   Collection Time: 03/20/19 10:52 PM   Specimen: Urine, Random  Result Value Ref Range Status   Specimen Description   Final    URINE, RANDOM Performed at [facility] [address] [place], [place] [address]    Special Requests   Final    NONE Performed at [facility], [place] [address], [place], [place] [address]    Culture (A)  Final    <10,000 COLONIES/mL INSIGNIFICANT GROWTH Performed at [facility] [address], [place], [place] [address]    Report Status 03/22/2019 FINAL  Final  MRSA PCR Screening     Status: Abnormal   Collection Time: 03/21/19  1:45 AM     Specimen: Nasal Mucosa; Nasopharyngeal  Result Value Ref Range Status   MRSA by PCR POSITIVE (A) NEGATIVE Final    Comment:        The GeneXpert MRSA Assay (FDA approved for NASAL specimens only), is one component of a comprehensive MRSA colonization surveillance program. It is not intended to diagnose MRSA infection nor to guide or monitor treatment for MRSA infections. RESULT CALLED TO, READ BACK BY AND VERIFIED WITH: JOHNSON AT 0317 ON 03/21/2019 BY JPM Performed at [facility] [address], [place], [place] [address]     Roland Prine N Kimberle Stanfill, [email or web address] 03/24/2019, 1:27 PM

## 2019-03-24 NOTE — Progress Notes (Addendum)
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 Ptient sttes he hd new onset shortness of breth nd cough productive of scnt yellow sputum, generlized wekness beginning 2 dys go. He hs chronic swelling nd dermtitis chnges in both of his legs which he sys hs been going on for mny yers. He hs hd subjective fevers nd poor ppetite. He ws with his brother nd hs no sick contcts tht he is wre of. He denies ny recent trvel. He sttes he does not tke ny medictions regulrly. He denied ny chest pin, plpittions, bdominl pin, dysuri, dirrhe.  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  Hospitl course s below  Assessment & Pln:   Principl Problem:   Acute on chronic respirtory filure with hypoxi (HCC) Active Problems:   Hypoklemi   Community cquired pneumoni   Cellulitis of foot, left   Chronic venous stsis dermtitis of both lower extremities   AKI (cute kidney injury) ([place])  Grm-negtive rod bcteremi  Sphingomons Pucimobilis Bcteremi. Continue current ntibiotics Apprecite ID recs given unusul species White blood cells 9.5, hemodynmiclly stble Blood cx from 7/26 pending Urine cx with <10,000 CFU's  Sepsis with cute respirtory filure with hypoxi nd PNA: As previously noted, chest x-ry with possible left sided pneumoni. Ptient presenting with  shortness of breth, cough productive of scnt yellow sputum, leukocytosis, nd fever. Willcont totret s community cquired pneumoni. Also possible contribution of HF excerbtion. 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 Recheck lbs in AM  Concern for Cirrhosis  Thrombocytopeni: Chronic nd ppers ner bseline. Uncler if this is been worked up in the pst. No ctive bleeding noted. Hold phrmcologic VTE prophylxis. Pst imging concerning for cirrhosis -> needs outptient follow up, will follow up lbs Follow HIV nd HCV b Follow INR Will need outptient follow up  OSA: Not using CPAP t home-BUT NEEDS TO - did well with it in the hospitl Continue supplementl O2 s needed.  Concern for gout flre; strt indomethcin per pt request, follow uric cid  DVT prophylxis: SCD Code Sttus: full  Fmily Communiction: none t bedside Disposition Pln: pending further improvement, identifiction of pending blood cx nd sensitivitis   Consultnts:   none  Procedures: **Note De-Identified vi Obfusction** Echo  IMPRESSIONS    1. The left ventricle hs norml systolic function with n ejection frction of 60-65%. The cvity size ws mildly dilted. Left ventriculr distolic prmeters were norml.  2. The right ventricle hs norml systolic function. The cvity ws norml. There is no increse in right ventriculr wll thickness.  3. Left tril size ws modertely dilted.  4. The ortic vlve is tricuspid. Moderte thickening of the ortic vlve. Moderte clcifiction of the ortic vlve. Mild stenosis of the ortic vlve.  5. The ort is norml in size nd structure.  LLE Kore Summry: Right: No evidence of common femorl vein obstruction. Left: There is no evidence of deep vein thrombosis in the lower extremity. No cystic structure found in the poplitel foss.  Antimicrobils:  Anti-infectives (From dmission, onwrd)   Strt     Dose/Rte Route Frequency Ordered Stop   03/22/19 1000  zithromycin (ZITHROMAX) tblet 500 mg     500 mg Orl Dily 03/21/19 1147 03/26/19 0959   03/21/19 0600  cefTRIAXone (ROCEPHIN) 2 g in sodium chloride 0.9 % 100 mL IVPB     2 g 200 mL/hr over 30 Minutes Intrvenous Dily 03/21/19 0102 03/26/19 0759   03/21/19 0115  zithromycin (ZITHROMAX) 500 mg in sodium chloride 0.9 % 250 mL IVPB  Sttus:  Discontinued     500 mg 250 mL/hr over 60 Minutes Intrvenous Dily t bedtime 03/21/19 0102 03/21/19 1147   03/20/19 2300  ceFEPIme (MAXIPIME) 1 g in sodium chloride 0.9 % 100 mL IVPB     1 g 200 mL/hr over 30 Minutes Intrvenous  Once 03/20/19 2247 03/20/19 2353   03/20/19 2300  vncomycin (VANCOCIN) 2,000 mg in sodium chloride 0.9 % 500 mL IVPB     2,000 mg 250 mL/hr over 120 Minutes Intrvenous  Once 03/20/19 2247 03/20/19 2316      Subjective: No complints tody  Objective: Vitls:   03/23/19 0602 03/23/19 1358 03/23/19 2045 03/24/19 0628  BP: (!) 148/67 (!) 142/70 (!) 147/97 (!) 148/81  Pulse: 67 62 65 62  Resp: 20 20 20 20   Temp: 98 F (36.7 C)  97.8 F (36.6 C) 98.3 F (36.8 C) 98.3 F (36.8 C)  TempSrc: Orl Orl Orl Orl  SpO2: 92% 96% 94% 94%  Weight:    131.8 kg  Height:        Intke/Output Summry (Lst 24 hours) t 03/24/2019 1501 Lst dt filed t 03/24/2019 0629 Gross per 24 hour  Intke 350 ml  Output 3500 ml  Net -3150 ml   Filed Weights   03/22/19 0407 03/23/19 0500 03/24/19 0628  Weight: (!) 137.1 kg (!) 138.3 kg 131.8 kg    Exmintion:  Generl: No cute distress. Crdiovsculr: Hert sounds show  regulr rte, nd rhythm.  Lungs: Cler to usculttion bilterlly Abdomen: Soft, nontender, nondistended  Neurologicl: Alert nd oriented 3. Moves ll extremities 4. Crnil nerves II through XII grossly intct. Skin: Wrm nd dry. No rshes or lesions. Extremities: L>R LEE edem  Dt Reviewed: I hve personlly reviewed following lbs nd imging studies  CBC: Recent Lbs  Lb 03/20/19 2147 03/21/19 0244 03/22/19 0809 03/23/19 0436 03/24/19 0446  WBC 13.3* 11.4* 9.5 6.2 4.5  NEUTROABS 12.0*  --  7.4 4.4  --   HGB 14.2 13.9 12.4* 12.6* 13.3  HCT 43.2 42.5 36.4* 36.9* 38.7*  MCV 99.5 100.7* 100.0 98.4 98.5  PLT 84* 75* 71* 74* 93*   Bsic Metbolic Pnel: Recent Lbs  Lb 03/20/19 2147  03/21/19 0244 03/22/19 0809 03/23/19 0436 03/24/19 0446  N 137 139 138 138 140  K 3.4* 3.5 3.2* 3.4* 3.1*  CL 105 107 107 107 106  CO2 22 20* 25 22 24   GLUCOSE 117* 119* 98 99 96  BUN 13 14 17 16 16   CRETININE 1.28* 1.14 0.98 0.82 0.81  CLCIUM 8.6* 8.2* 8.2* 7.9* 8.1*  MG  --  1.6*  --   --  2.3   GFR: Estimated Creatinine Clearance: 105.8 mL/min (by C-G formula based on SCr of 0.81 mg/dL). Liver Function Tests: Recent Labs  Lab 03/20/19 2147 03/24/19 0446  ST 29 25  LT 26 19  LKPHOS 92 69  BILITOT 2.2* 1.0  PROT 8.2* 6.7  LBUMIN 3.6 3.0*   No results for input(s): LIPSE, MYLSE in the last 168 hours. No results for input(s): MMONI in the last 168 hours. Coagulation  Profile: No results for input(s): INR, PROTIME in the last 168 hours. Cardiac Enzymes: No results for input(s): CKTOTL, CKMB, CKMBINDEX, TROPONINI in the last 168 hours. BNP (last 3 results) No results for input(s): PROBNP in the last 8760 hours. Hb1C: No results for input(s): HGB1C in the last 72 hours. CBG: No results for input(s): GLUCP in the last 168 hours. Lipid Profile: No results for input(s): CHOL, HDL, LDLCLC, TRIG, CHOLHDL, LDLDIRECT in the last 72 hours. Thyroid Function Tests: No results for input(s): TSH, T4TOTL, FREET4, T3FREE, THYROIDB in the last 72 hours. nemia Panel: No results for input(s): VITMINB12, FOLTE, FERRITIN, TIBC, IRON, RETICCTPCT in the last 72 hours. Sepsis Labs: Recent Labs  Lab 03/20/19 2147 03/20/19 2353 03/21/19 0244  PROCLCITON  --   --  4.24  LTICCIDVEN 3.2* 2.4*  --     Recent Results (from the past 240 hour(s))  Blood culture (routine x 2)     Status: bnormal   Collection Time: 03/20/19  9:47 PM   Specimen: BLOOD  Result Value Ref Range Status   Specimen Description   Final    BLOOD LEFT NTECUBITL Performed at [facility] [address], [place], [place] [address]    Special Requests   Final    BOTTLES DRWN EROBIC ND NEROBIC Blood Culture adequate volume Performed at [facility] [address] [place], [place] [address]    Culture  Setup Time   Final    EROBIC BOTTLE ONLY GRM NEGTIVE RODS CRITICL VLUE NOTED.  VLUE IS CONSISTENT WITH PREVIOUSLY REPORTED ND CLLED VLUE.    Culture ()  Final    SPHINGOMONS PUCIMOBILIS SUSCEPTIBILITIES PERFORMED ON PREVIOUS CULTURE WITHIN THE LST 5 DYS. Performed at [facility], [place] [address], [place], [place] [address]    Report Status 03/24/2019 FINL  Final  SRS Coronavirus 2 (CEPHEID - Performed in [place] hospital lab), Hosp Order     Status: None   Collection Time: 03/20/19  9:47 PM   Specimen:  Nasopharyngeal Swab  Result Value Ref Range Status   SRS Coronavirus 2 NEGTIVE NEGTIVE Final    Comment: (NOTE) If result is NEGTIVE SRS-CoV-2 target nucleic acids are NOT DETECTED. The SRS-CoV-2 RN is generally detectable in upper and lower  respiratory specimens during the acute phase of infection. The lowest  concentration of SRS-CoV-2 viral copies this assay can detect is 250  copies / mL.  negative result does not preclude SRS-CoV-2 infection  and should not be used as the sole basis for treatment or other  patient management decisions.    negative result may occur with  improper specimen collection / handling, submission of specimen other  than nasopharyngeal swab, presence of viral mutation(s) within the  areas targeted by this assay, and inadequate number of viral copies  (<250 copies / mL).  negative result must be combined with clinical  observations, patient history, and epidemiological information. If result is POSITIVE SRS-CoV-2 target nucleic acids are DETECTED. The SRS-CoV-2 RN is generally detectable in upper and lower  respiratory specimens dur ing the acute phase of infection.  Positive  results are indicative of active infection with SRS-CoV-2.  Clinical  correlation with patient history and other diagnostic information is  necessary to determine patient infection status.  Positive results do  not rule out bacterial infection or co-infection with other viruses. If result is PRESUMPTIVE POSTIVE SRS-CoV-2 nucleic acids MY BE PRESENT.    presumptive positive result was obtained on the submitted specimen  and confirmed on repeat testing.  While 2019 novel coronavirus  (SRS-CoV-2) nucleic acids may be present in the submitted sample  additional confirmatory testing may be necessary for epidemiological  and / or clinical management purposes  to differentiate between  SRS-CoV-2 and other Sarbecovirus currently known to infect humans.  If clinically  indicated additional testing with an alternate test  methodology [id number]) is advised. The SRS-CoV-2 RN is generally  detectable in upper and lower respiratory sp ecimens during the acute  phase of infection. The expected result is Negative. Fact Sheet for Patients:  StrictlyIdeas.no Fact Sheet for Healthcare Providers: BankingDealers.co.za This test is not yet approved or cleared by the [place] FD and has been authorized for detection and/or diagnosis of SRS-CoV-2 by FD under an Emergency Use uthorization (EU).  This EU will remain in effect (meaning this test can be used) for the duration of the COVID-19 declaration under Section 564(b)(1) of the ct, 21 U.S.C. section 360bbb-3(b)(1), unless the authorization is terminated or revoked sooner. Performed at [facility], [place] [address] [place], [place] [address]   Blood culture (routine x 2)     Status: bnormal   Collection Time: 03/20/19  9:52 PM   Specimen: BLOOD LEFT FORERM  Result Value Ref Range Status   Specimen Description   Final    BLOOD LEFT FORERM Performed at [facility] [address] [place], [place] [address]    Special Requests   Final    BOTTLES DRWN EROBIC ND NEROBIC Blood Culture results may not be optimal due to an inadequate volume of blood received in culture bottles Performed at [facility] [address] [place], [place] [address]    Culture  Setup Time   Final    IN BOTH EROBIC ND NEROBIC BOTTLES GRM NEGTIVE RODS CRITICL RESULT CLLED TO, RED BCK BY ND VERIFIED WITH: Lavell Luster [facility] 03/22/19 0106 JDW Performed at [facility], [place] [address], [place], [place] [address]    Culture SPHINGOMONS PUCIMOBILIS ()  Final   Report Status 03/24/2019 FINL  Final   Organism ID, Bacteria SPHINGOMONS PUCIMOBILIS  Final      Susceptibility   Sphingomonas  paucimobilis - MIC*    CEFEPIME <=1 SENSITIVE Sensitive     CEFZOLIN <=4 SENSITIVE Sensitive     GENTMICIN >=16 RESISTNT Resistant     CIPROFLOXCIN <=0.25 SENSITIVE Sensitive     IMIPENEM <=0.25 SENSITIVE Sensitive     TRIMETH/SULF <=20 SENSITIVE Sensitive     * SPHINGOMONS PUCIMOBILIS  Blood Culture ID Panel (Reflexed) **Note De-Identified vi Obfusction** Sttus: None   Collection Time: 03/20/19  9:52 PM  Result Vlue Ref Rnge Sttus   Enterococcus species NOT DETECTED NOT DETECTED Finl   Listeri monocytogenes NOT DETECTED NOT DETECTED Finl   Stphylococcus species NOT DETECTED NOT DETECTED Finl   Stphylococcus ureus (BCID) NOT DETECTED NOT DETECTED Finl   Streptococcus species NOT DETECTED NOT DETECTED Finl   Streptococcus glctie NOT DETECTED NOT DETECTED Finl   Streptococcus pneumonie NOT DETECTED NOT DETECTED Finl   Streptococcus pyogenes NOT DETECTED NOT DETECTED Finl   cinetobcter bumnnii NOT DETECTED NOT DETECTED Finl   Enterobctericee species NOT DETECTED NOT DETECTED Finl   Enterobcter cloce complex NOT DETECTED NOT DETECTED Finl   Escherichi coli NOT DETECTED NOT DETECTED Finl   Klebsiell oxytoc NOT DETECTED NOT DETECTED Finl   Klebsiell pneumonie NOT DETECTED NOT DETECTED Finl   Proteus species NOT DETECTED NOT DETECTED Finl   Serrti mrcescens NOT DETECTED NOT DETECTED Finl   Hemophilus influenze NOT DETECTED NOT DETECTED Finl   Neisseri meningitidis NOT DETECTED NOT DETECTED Finl   Pseudomons eruginos NOT DETECTED NOT DETECTED Finl   Cndid lbicns NOT DETECTED NOT DETECTED Finl   Cndid glbrt NOT DETECTED NOT DETECTED Finl   Cndid krusei NOT DETECTED NOT DETECTED Finl   Cndid prpsilosis NOT DETECTED NOT DETECTED Finl   Cndid tropiclis NOT DETECTED NOT DETECTED Finl    Comment: Performed t [facility], [address] [place], [place] [address]  Urine culture     Sttus: bnorml   Collection Time: 03/20/19 10:52  PM   Specimen: Urine, Rndom  Result Vlue Ref Rnge Sttus   Specimen Description   Finl    URINE, RNDOM Performed t [facility] [address] [place], [place] [address]    Specil Requests   Finl    NONE Performed t [facility], [place] [address], [place], [place] [address]    Culture ()  Finl    <10,000 COLONIES/mL INSIGNIFICNT GROWTH Performed t [facility] [address], [place], [place] [address]    Report Sttus 03/22/2019 FINL  Finl  MRS PCR Screening     Sttus: bnorml   Collection Time: 03/21/19  1:45 M   Specimen: Nsl Mucos; Nsophryngel  Result Vlue Ref Rnge Sttus   MRS by PCR POSITIVE () NEGTIVE Finl    Comment:        The GeneXpert MRS ssy (FD pproved for NSL specimens only), is one component of  comprehensive MRS coloniztion surveillnce progrm. It is not intended to dignose MRS infection nor to guide or monitor tretment for MRS infections. RESULT CLLED TO, RED BCK BY ND VERIFIED WITH: JOHNSON T 0317 ON 03/21/2019 BY JPM Performed t [facility] [address], [place], [place] [address]          Rdiology Studies: Dg Chest 2 View  Result Dte: 03/23/2019 CLINICL DT:  Hypertension EXM: CHEST - 2 VIEW COMPRISON:  Sme-dy rdiogrph FINDINGS: Crdic silhouette is enlrged, unchnged. ortic clcified nd tortuous. Overll lung ertion slightly improved. Streky bibsilr opcities, right greter thn left, similr to prior. No new focl irspce consolidtion, pleurl effusion, or pneumothorx. IMPRESSION: Slight improvement in lung ertion compred to prior. Persistent bibsilr telectsis. Electroniclly Signed   By: Dvin Poke M.D.   On: 03/23/2019 14:51   Dg Chest Port 1 View  Result Dte: 03/23/2019 CLINICL DT:  Pneumoni, history hypertension EXM: PORTBLE CHEST 1 VIEW COMPRISON:  Portble exm 1638  hours compared  to 03/21/2019 FINDINGS: Severely rotated to the LEFT. Enlargement of cardiac silhouette with vascular congestion. Atherosclerotic calcification aorta. Diffuse interstitial infiltrates greater on RIGHT, favor pulmonary edema. Mild atelectasis at RIGHT base. No definite pleural effusion or pneumothorax. Bones demineralized. IMPRESSION: Probable pulmonary edema, slightly increased. Mild RIGHT basilar atelectasis. Electronically Signed   By: Lavonia Dana M.D.   On: 03/23/2019 08:14        Scheduled Meds: . azithromycin  500 mg Oral Daily  . chlorhexidine  15 mL Mouth Rinse BID  . Chlorhexidine Gluconate Cloth  6 each Topical Daily  . mouth rinse  15 mL Mouth Rinse q12n4p  . mupirocin ointment  1 application Nasal BID   Continuous Infusions: . cefTRIAXone (ROCEPHIN)  IV 2 g (03/24/19 0938)     LOS: 3 days    Time spent: over 30 min    Fayrene Helper, MD [facility] Pager AMION  If 7PM-7AM, please contact night-coverage [email or web address] Password [email or web address] 03/24/2019, 3:01 PM

## 2019-03-24 NOTE — Progress Notes (Signed)
Patient continues to decline nocturnal CPAP/BiPAP. Equipment pulled from the room. Patient agrees. Order changed to prn per RT protocol.

## 2019-03-24 NOTE — Care Management Important Message (Signed)
Important Message  Patient Details IM Letter given to Cookie McGibboney RN to present to the Patient Name: Eric Schultz MRN: [id number] Date of Birth: 1940/12/24   Medicare Important Message Given:  Yes     Kerin Salen 03/24/2019, 11:27 AM

## 2019-03-24 NOTE — Progress Notes (Signed)
PT Cancellation Note  Patient Details Name: Eric Schultz MRN: [id number] DOB: [birth year]   Cancelled Treatment:    Reason Eval/Treat Not Completed: Patient declined, no reason specified Pt sleeping on arrival and politely declined to participate at this time. Requests PT check back tomorrow.  Will check back as schedule permits.   Renee Beale,KATHrine E 03/24/2019, 3:58 PM Carmelia Bake, PT, DPT Acute Rehabilitation Services Office: [phone] Pager: [phone]

## 2019-03-25 ENCOUNTER — Inpatient Hospital Stay (HOSPITAL_COMMUNITY): Payer: Medicare PPO

## 2019-03-25 DIAGNOSIS — R0602 Shortness of breath: Secondary | ICD-10-CM

## 2019-03-25 DIAGNOSIS — R531 Weakness: Secondary | ICD-10-CM

## 2019-03-25 DIAGNOSIS — I890 Lymphedema, not elsewhere classified: Secondary | ICD-10-CM

## 2019-03-25 DIAGNOSIS — A4189 Other specified sepsis: Secondary | ICD-10-CM

## 2019-03-25 LAB — COMPREHENSIVE METABOLIC PANEL
ALT: 20 U/L (ref 0–44)
AST: 25 U/L (ref 15–41)
Albumin: 3 g/dL — ABNORMAL LOW (ref 3.5–5.0)
Alkaline Phosphatase: 72 U/L (ref 38–126)
Anion gap: 8 (ref 5–15)
BUN: 19 mg/dL (ref 8–23)
CO2: 26 mmol/L (ref 22–32)
Calcium: 8.3 mg/dL — ABNORMAL LOW (ref 8.9–10.3)
Chloride: 106 mmol/L (ref 98–111)
Creatinine, Ser: 0.94 mg/dL (ref 0.61–1.24)
GFR calc Af Amer: >60 mL/min (ref >60)
GFR calc non Af Amer: >60 mL/min (ref >60)
Glucose, Bld: 97 mg/dL (ref 70–99)
Potassium: 3.5 mmol/L (ref 3.5–5.1)
Sodium: 140 mmol/L (ref 135–145)
Total Bilirubin: 0.9 mg/dL (ref 0.3–1.2)
Total Protein: 7.1 g/dL (ref 6.5–8.1)

## 2019-03-25 LAB — PROTIME-INR
INR: 1.1 (ref 0.8–1.2)
Prothrombin Time: 13.8 seconds (ref 11.4–15.2)

## 2019-03-25 LAB — CBC
HCT: 38.6 % — ABNORMAL LOW (ref 39.0–52.0)
Hemoglobin: 13.3 g/dL (ref 13.0–17.0)
MCH: 33.8 pg (ref 26.0–34.0)
MCHC: 34.5 g/dL (ref 30.0–36.0)
MCV: 98.2 fL (ref 80.0–100.0)
Platelets: 111 10*3/uL — ABNORMAL LOW (ref 150–400)
RBC: 3.93 MIL/uL — ABNORMAL LOW (ref 4.22–5.81)
RDW: 13.2 % (ref 11.5–15.5)
WBC: 4.7 10*3/uL (ref 4.0–10.5)
nRBC: 0 % (ref 0.0–0.2)

## 2019-03-25 LAB — HIV ANTIBODY (ROUTINE TESTING W REFLEX): HIV Screen 4th Generation wRfx: NONREACTIVE

## 2019-03-25 LAB — HEPATITIS C ANTIBODY: HCV Ab: 0.2 s/co ratio (ref 0.0–0.9)

## 2019-03-25 LAB — BRAIN NATRIURETIC PEPTIDE: B Natriuretic Peptide: 126.2 pg/mL — ABNORMAL HIGH (ref 0.0–100.0)

## 2019-03-25 LAB — MAGNESIUM: Magnesium: 2.3 mg/dL (ref 1.7–2.4)

## 2019-03-25 MED ORDER — FUROSEMIDE 20 MG PO TABS: 20.0000 mg | ORAL_TABLET | Freq: Every day | ORAL | 0 refills | Status: DC

## 2019-03-25 MED ORDER — POTASSIUM CHLORIDE CRYS ER 20 MEQ PO TBCR: 20.0000 meq | EXTENDED_RELEASE_TABLET | Freq: Every day | ORAL | 0 refills | Status: DC

## 2019-03-25 MED ORDER — LEVOFLOXACIN 750 MG PO TABS: 750.0000 mg | ORAL_TABLET | Freq: Every day | ORAL | 0 refills | Status: AC

## 2019-03-25 MED ORDER — INDOMETHACIN 25 MG PO CAPS: 25.0000 mg | ORAL_CAPSULE | Freq: Three times a day (TID) | ORAL | 0 refills | Status: AC | PRN

## 2019-03-25 NOTE — Progress Notes (Signed)
AVS given to patient and explained at the bedside. Medications and follow up appointments have been explained with pt verbalizing understanding.  

## 2019-03-25 NOTE — Discharge Summary (Signed)
Physician Discharge Summary  Eric Schultz VZD:[id number] DOB: 11-Aug-1941 DOA: 03/20/2019  [facility]  Admit date: 03/20/2019 Discharge date: 03/25/2019  Time spent: 40 minutes  Recommendations for Outpatient Follow-up:  1. Follow outpatient CBC/CMP 2. Follow up cirrhosis outpatient - needs further workup, GI follow up 3. Follow up gout outpatient - pt declined allopurinol or new therapy here 4. Follow lasix dosing - discharged on lasix for heart failure - follow electrolytes and renal function 5. Pt with OSA, needs to follow up outpatient - was not using CPAP here  6. Follow up LE swelling, lichenification outpatient    Discharge Diagnoses:  Principal Problem:   Acute on chronic respiratory failure with hypoxia (HCC) Active Problems:   Hypokalemia   Community acquired pneumonia   Cellulitis of foot, left   Chronic venous stasis dermatitis of both lower extremities   AKI (acute kidney injury) ([place])  Discharge Condition: stable  Diet recommendation: heart healthy  Filed Weights   03/23/19 0500 03/24/19 0628 03/25/19 0636  Weight: (!) 138.3 kg 131.8 kg 130.9 kg    History of present illness:  Per admitting history and physical Eric Weissinger Czerskiis a 78 y.o.malewith medical history significant forhypertension, gout, thrombocytopenia, OSA, chronic venous stasis dermatitis of both lower extremities who presents to the ED for evaluation of shortness of breath. Patient states he had new onset shortness of breath and cough productive of scant yellow sputum, generalized weakness beginning 2 days ago. He has chronic swelling and dermatitis changes in both of his legs which he says has been going on for many years. He has had subjective fevers and poor appetite. He was with his brother and has no sick contacts that he is aware of. He denies any recent travel. He states he does not take any medications regularly. He deniedany chest pain, palpitations, abdominal  pain, dysuria, diarrhea.  He was admitted for concern for pneumonia and found to have gram negative bacteremia.  He was also treated for possible cellulitis of his lower extremities.  He eventually grew sphingomonas pauicmobilis.  ID was c/s and recommended completing course of levaquin.  He has lab abnormalities and imaging findings concerning for cirrhosis, this needs to be followed outpatient.   See below for additional details  Hospital Course:  Gram-negative rod bacteremia  Sphingomonas Paucimobilis Bacteremia. Continue current antibiotics Appreciate ID recs given unusual species -> recommending complete course of levaquin for 2 week total course White blood cells 9.5, hemodynamically stable Blood cx from 7/26 pending Urine cx with <10,000 CFU's  Sepsis with acute respiratory failure with hypoxiaand PNA: As previously noted, chest x-ray with possible left sided pneumonia. Patient presenting with shortness of breath, cough productive of scant yellow sputum, leukocytosis, and fever. Willcont totreat as community acquired pneumonia. Also possible contribution of HF exacerbation. -Continue IV ceftriaxone and azithromycin - d/c on levaquin -negative urine strep -negative legionella  -Continue supplemental oxygenweaned bipap, on [place] currently -follow CXR intermittently - 2 view today with bibasilar atelectasis -BNP elevated-but nl echo 60-65, no ddx noted  Heart Failure with preserved EF Exacerbation:  Echo with normal EF - 29-51%, LV diastolic parameters normal (see report) Imaging was concerning for pulmonary edema and pt also had elevated BNP Will give dose of lasix again and follow response Follow CXR 7/31 - trace edema, improved R greater than L bibasilar atelectasis Will continue to monitor Follow BNP in AM (improved) Discharged on lasix/potassium -> instructed to f/u with PCP within 1 week for labs  Cellulitis of left  foot with chronic venous stasis dermatitis of both  lower extremities: -Continue IV ceftriaxonefor now  -checked doppler, neg dvt - suspect this is more likely chronic venous stasis dermatitis, discharged on levaquin  Acute kidney injury: Patient was given 1 L normal saline in [place] with holding fluids given pulm edema  Hypertension: Borderline here, will continue to follow  Hypokalemia: Oral repletion ordered. Recheck labs in AM  Concern for Cirrhosis  Thrombocytopenia: Chronic and appears near baseline. Unclear if this is been worked up in the past. No active bleeding noted. Hold pharmacologic VTE prophylaxis. Past imaging concerning for cirrhosis -> needs outpatient follow up, will follow up labs Follow HIV and HCV ab -> negative Follow INR 1.1  Will need outpatient follow up - discussed with pt  OSA: Not using CPAP at home-BUT NEEDS TO - did well with it in the hospital Continue supplemental O2 as needed.  Concern for gout flare; start indomethacin per pt request, follow uric acid - he declined any other medications  Procedures: Echo IMPRESSIONS    1. The left ventricle has normal systolic function with an ejection fraction of 60-65%. The cavity size was mildly dilated. Left ventricular diastolic parameters were normal.  2. The right ventricle has normal systolic function. The cavity was normal. There is no increase in right ventricular wall thickness.  3. Left atrial size was moderately dilated.  4. The aortic valve is tricuspid. Moderate thickening of the aortic valve. Moderate calcification of the aortic valve. Mild stenosis of the aortic valve.  5. The aorta is normal in size and structure.  LE [place] Summary: Right: No evidence of common femoral vein obstruction. Left: There is no evidence of deep vein thrombosis in the lower extremity. No cystic structure found in the popliteal fossa.  Consultations:  ID  Discharge Exam: Vitals:   03/25/19 0636 03/25/19 1300  BP: 138/87 135/79  Pulse: (!) 56  65  Resp: 20 18  Temp: 98 F (36.7 C) 98.1 F (36.7 C)  SpO2: 93% 95%   Eager to go home Discussed d/c plan  General: No acute distress. Cardiovascular: Heart sounds show a regular rate, and rhythm.  Lungs: Clear to auscultation bilaterally Abdomen: Soft, nontender, nondistended  Neurological: Alert and oriented 3. Moves all extremities 4. Cranial nerves II through XII grossly intact. Skin: Warm and dry. No rashes or lesions. Extremities: L>R LEE edema, improved  Discharge Instructions   Discharge Instructions    Diet - low sodium heart healthy   Complete by: As directed    Discharge instructions   Complete by: As directed    You were seen for a bacterial infection in your blood.  You've improved with antibiotics.  We'll discharge you with 9 additional days of levaquin.    Please follow up with your PCP within a week to repeat labs and follow up your hospital course.    You may have been predisposed to this infection because of your likely cirrhosis.  Please follow up with your PCP regarding further work up for this.  Also, please continue to follow up with your PCP to continue care for your legs.  Please discuss your gout with your PCP and additional potential medications.  Return for new, recurrent, or worsening symptoms.  Please ask your PCP to request records from this hospitalization so they know what was done and what the next steps will be.   Increase activity slowly   Complete by: As directed      Allergies as of 03/25/2019  Reactions   Sulfa Antibiotics Nausea And Vomiting      Medication List    STOP taking these medications   fluticasone 50 MCG/ACT nasal spray Commonly known as: FLONASE   HYDROcodone-acetaminophen 5-325 MG tablet Commonly known as: NORCO/VICODIN   ibuprofen 800 MG tablet Commonly known as: ADVIL   nystatin powder Commonly known as: MYCOSTATIN/NYSTOP     TAKE these medications   furosemide 20 MG tablet Commonly known as:  Lasix Take 1 tablet (20 mg total) by mouth daily. (please follow up with your PCP to check labs and weight within 1 week) What changed: additional instructions   indomethacin 25 MG capsule Commonly known as: INDOCIN Take 1 capsule (25 mg total) by mouth 3 (three) times daily as needed for up to 7 days (for gout).   levofloxacin 750 MG tablet Commonly known as: Levaquin Take 1 tablet (750 mg total) by mouth daily for 9 days.   polyethylene glycol powder 17 GM/SCOOP powder Commonly known as: GLYCOLAX/MIRALAX Take 17 g by mouth daily. What changed: Another medication with the same name was removed. Continue taking this medication, and follow the directions you see here.   potassium chloride SA 20 MEQ tablet Commonly known as: K-DUR Take 1 tablet (20 mEq total) by mouth daily for 14 days. (follow up with your PCP for labs within 1 week) What changed: additional instructions      Allergies  Allergen Reactions  . Sulfa Antibiotics Nausea And Vomiting      The results of significant diagnostics from this hospitalization (including imaging, microbiology, ancillary and laboratory) are listed below for reference.    Significant Diagnostic Studies: Dg Chest 2 View  Result Date: 03/25/2019 CLINICAL DATA:  Shortness of breath. Chart history: hypertension, gout, thrombocytopenia, OSA, chronic venous stasis dermatitis of both lower extremities who presents to the ED for evaluation of shortness of breath. Patient states he had new o.*comment was truncated* EXAM: CHEST - 2 VIEW COMPARISON:  Chest radiographs dated 03/23/2019, 03/21/2019 FINDINGS: Stable cardiomediastinal contours with cardiomegaly. Aortic calcifications. Decreased bibasilar right greater than left linear opacities. Persistent mild interstitial prominence. No new focal infiltrate. No pleural effusion or pneumothorax. Degenerative changes in the thoracic spine. IMPRESSION: Improved right greater than left bibasilar atelectasis with  suspected trace pulmonary edema. Electronically Signed   By: Audie Pinto M.D.   On: 03/25/2019 11:47   Dg Chest 2 View  Result Date: 03/23/2019 CLINICAL DATA:  Hypertension EXAM: CHEST - 2 VIEW COMPARISON:  Same-day radiograph FINDINGS: Cardiac silhouette is enlarged, unchanged. Aortic calcified and tortuous. Overall lung aeration slightly improved. Streaky bibasilar opacities, right greater than left, similar to prior. No new focal airspace consolidation, pleural effusion, or pneumothorax. IMPRESSION: Slight improvement in lung aeration compared to prior. Persistent bibasilar atelectasis. Electronically Signed   By: Davina Poke M.D.   On: 03/23/2019 14:51   Dg Chest Port 1 View  Result Date: 03/23/2019 CLINICAL DATA:  Pneumonia, history hypertension EXAM: PORTABLE CHEST 1 VIEW COMPARISON:  Portable exam 0435 hours compared to 03/21/2019 FINDINGS: Severely rotated to the LEFT. Enlargement of cardiac silhouette with vascular congestion. Atherosclerotic calcification aorta. Diffuse interstitial infiltrates greater on RIGHT, favor pulmonary edema. Mild atelectasis at RIGHT base. No definite pleural effusion or pneumothorax. Bones demineralized. IMPRESSION: Probable pulmonary edema, slightly increased. Mild RIGHT basilar atelectasis. Electronically Signed   By: Lavonia Dana M.D.   On: 03/23/2019 08:14   Dg Chest Port 1 View  Result Date: 03/21/2019 CLINICAL DATA:  Respiratory failure with hypoxia. EXAM: PORTABLE  CHEST 1 VIEW COMPARISON:  03/20/2019; 07/10/2017; CT abdomen pelvis - 11/30/2018 FINDINGS: Grossly unchanged enlarged cardiac silhouette and mediastinal contours with atherosclerotic plaque within thoracic aorta. There is persistent thickening the right paratracheal stripe presumably secondary prominent vasculature. The pulmonary vasculature remains indistinct with cephalization of flow. Suspected development of small bilateral effusions with associated worsening bibasilar  heterogeneous/consolidative opacities. No pneumothorax. No acute osseous abnormalities. IMPRESSION: Cardiomegaly with findings suggestive of pulmonary edema with suspected trace bilateral effusions and worsening bibasilar opacities, atelectasis versus infiltrate. Further evaluation with a PA and lateral chest radiograph may be obtained as clinically indicated. Electronically Signed   By: Sandi Mariscal M.D.   On: 03/21/2019 07:36   Dg Chest Portable 1 View  Result Date: 03/20/2019 CLINICAL DATA:  Fever EXAM: PORTABLE CHEST 1 VIEW COMPARISON:  07/10/2017 FINDINGS: Cardiomegaly with central vascular congestion. Low lung volumes. Patchy opacity at the left base. No pneumothorax. IMPRESSION: 1. Low lung volumes. Patchy atelectasis or mild infiltrate at the left base 2. Cardiomegaly with mild central congestion Electronically Signed   By: Donavan Foil M.D.   On: 03/20/2019 22:19   Vas [place] Lower Extremity Venous (dvt)  Result Date: 03/21/2019  Lower Venous Study Indications: Swelling.  Risk Factors: None identified. Limitations: Body habitus, poor ultrasound/tissue interface and skin changes. Comparison Study: No prior studies. Performing Technologist: Oliver Hum RVT  Examination Guidelines: A complete evaluation includes B-mode imaging, spectral Doppler, color Doppler, and power Doppler as needed of all accessible portions of each vessel. Bilateral testing is considered an integral part of a complete examination. Limited examinations for reoccurring indications may be performed as noted.  +-----+---------------+---------+-----------+----------+-------+ RIGHTCompressibilityPhasicitySpontaneityPropertiesSummary +-----+---------------+---------+-----------+----------+-------+ CFV  Full           Yes      Yes                          +-----+---------------+---------+-----------+----------+-------+   +---------+---------------+---------+-----------+----------+-------+ LEFT      CompressibilityPhasicitySpontaneityPropertiesSummary +---------+---------------+---------+-----------+----------+-------+ CFV      Full           Yes      Yes                          +---------+---------------+---------+-----------+----------+-------+ SFJ      Full                                                 +---------+---------------+---------+-----------+----------+-------+ FV Prox  Full                                                 +---------+---------------+---------+-----------+----------+-------+ FV Mid   Full                                                 +---------+---------------+---------+-----------+----------+-------+ FV DistalFull                                                 +---------+---------------+---------+-----------+----------+-------+  PFV      Full                                                 +---------+---------------+---------+-----------+----------+-------+ POP      Full           Yes      Yes                          +---------+---------------+---------+-----------+----------+-------+ PTV      Full                                                 +---------+---------------+---------+-----------+----------+-------+ PERO     Full                                                 +---------+---------------+---------+-----------+----------+-------+     Summary: Right: No evidence of common femoral vein obstruction. Left: There is no evidence of deep vein thrombosis in the lower extremity. No cystic structure found in the popliteal fossa.  *See table(s) above for measurements and observations. Electronically signed by Ruta Hinds MD on 03/21/2019 at 6:01:53 PM.    Final     Microbiology: Recent Results (from the past 240 hour(s))  Blood culture (routine x 2)     Status: Abnormal   Collection Time: 03/20/19  9:47 PM   Specimen: BLOOD  Result Value Ref Range Status   Specimen Description   Final    BLOOD  LEFT ANTECUBITAL Performed at [facility], [place] [address], [place], [place] [address]    Special Requests   Final    BOTTLES DRAWN AEROBIC AND ANAEROBIC Blood Culture adequate volume Performed at [facility] [address] [place], [place] [address]    Culture  Setup Time   Final    AEROBIC BOTTLE ONLY GRAM NEGATIVE RODS CRITICAL VALUE NOTED.  VALUE IS CONSISTENT WITH PREVIOUSLY REPORTED AND CALLED VALUE.    Culture (A)  Final    SPHINGOMONAS PAUCIMOBILIS SUSCEPTIBILITIES PERFORMED ON PREVIOUS CULTURE WITHIN THE LAST 5 DAYS. Performed at [facility], [place] [address], [place], [place] [address]    Report Status 03/24/2019 FINAL  Final  SARS Coronavirus 2 (CEPHEID - Performed in [place] hospital lab), Hosp Order     Status: None   Collection Time: 03/20/19  9:47 PM   Specimen: Nasopharyngeal Swab  Result Value Ref Range Status   SARS Coronavirus 2 NEGATIVE NEGATIVE Final    Comment: (NOTE) If result is NEGATIVE SARS-CoV-2 target nucleic acids are NOT DETECTED. The SARS-CoV-2 RNA is generally detectable in upper and lower  respiratory specimens during the acute phase of infection. The lowest  concentration of SARS-CoV-2 viral copies this assay can detect is 250  copies / mL. A negative result does not preclude SARS-CoV-2 infection  and should not be used as the sole basis for treatment or other  patient management decisions.  A negative result may occur with  improper specimen collection / handling, submission of specimen other  than nasopharyngeal swab, presence  of viral mutation(s) within the  areas targeted by this assay, and inadequate number of viral copies  (<250 copies / mL). A negative result must be combined with clinical  observations, patient history, and epidemiological information. If result is POSITIVE SARS-CoV-2 target nucleic acids are DETECTED. The SARS-CoV-2 RNA is generally detectable in upper and lower   respiratory specimens dur ing the acute phase of infection.  Positive  results are indicative of active infection with SARS-CoV-2.  Clinical  correlation with patient history and other diagnostic information is  necessary to determine patient infection status.  Positive results do  not rule out bacterial infection or co-infection with other viruses. If result is PRESUMPTIVE POSTIVE SARS-CoV-2 nucleic acids MAY BE PRESENT.   A presumptive positive result was obtained on the submitted specimen  and confirmed on repeat testing.  While 2019 novel coronavirus  (SARS-CoV-2) nucleic acids may be present in the submitted sample  additional confirmatory testing may be necessary for epidemiological  and / or clinical management purposes  to differentiate between  SARS-CoV-2 and other Sarbecovirus currently known to infect humans.  If clinically indicated additional testing with an alternate test  methodology [phone]) is advised. The SARS-CoV-2 RNA is generally  detectable in upper and lower respiratory sp ecimens during the acute  phase of infection. The expected result is Negative. Fact Sheet for Patients:  StrictlyIdeas.no Fact Sheet for Healthcare Providers: BankingDealers.co.za This test is not yet approved or cleared by the [place] FDA and has been authorized for detection and/or diagnosis of SARS-CoV-2 by FDA under an Emergency Use Authorization (EUA).  This EUA will remain in effect (meaning this test can be used) for the duration of the COVID-19 declaration under Section 564(b)(1) of the Act, 21 U.S.C. section 360bbb-3(b)(1), unless the authorization is terminated or revoked sooner. Performed at [facility], [place] [address] [place], [place] [address]   Blood culture (routine x 2)     Status: Abnormal   Collection Time: 03/20/19  9:52 PM   Specimen: BLOOD LEFT FOREARM  Result Value Ref Range Status   Specimen  Description   Final    BLOOD LEFT FOREARM Performed at [facility] [address], [place], [place] [address]    Special Requests   Final    BOTTLES DRAWN AEROBIC AND ANAEROBIC Blood Culture results may not be optimal due to an inadequate volume of blood received in culture bottles Performed at [facility] [address] [place], [place] [address]    Culture  Setup Time   Final    IN BOTH AEROBIC AND ANAEROBIC BOTTLES GRAM NEGATIVE RODS CRITICAL RESULT CALLED TO, READ BACK BY AND VERIFIED WITH: Lavell Luster [facility] 03/22/19 0106 JDW Performed at [facility], [place] [address] [place],  [address]    Culture SPHINGOMONAS PAUCIMOBILIS (A)  Final   Report Status 03/24/2019 FINAL  Final   Organism ID, Bacteria SPHINGOMONAS PAUCIMOBILIS  Final      Susceptibility   Sphingomonas paucimobilis - MIC*    CEFEPIME <=1 SENSITIVE Sensitive     CEFAZOLIN <=4 SENSITIVE Sensitive     GENTAMICIN >=16 RESISTANT Resistant     CIPROFLOXACIN <=0.25 SENSITIVE Sensitive     IMIPENEM <=0.25 SENSITIVE Sensitive     TRIMETH/SULFA <=20 SENSITIVE Sensitive     * SPHINGOMONAS PAUCIMOBILIS  Blood Culture ID Panel (Reflexed)     Status: None   Collection Time: 03/20/19  9:52 PM  Result Value Ref Range Status  Enterococcus species NOT DETECTED NOT DETECTED Final   Listeria monocytogenes NOT DETECTED NOT DETECTED Final   Staphylococcus species NOT DETECTED NOT DETECTED Final   Staphylococcus aureus (BCID) NOT DETECTED NOT DETECTED Final   Streptococcus species NOT DETECTED NOT DETECTED Final   Streptococcus agalactiae NOT DETECTED NOT DETECTED Final   Streptococcus pneumoniae NOT DETECTED NOT DETECTED Final   Streptococcus pyogenes NOT DETECTED NOT DETECTED Final   Acinetobacter baumannii NOT DETECTED NOT DETECTED Final   Enterobacteriaceae species NOT DETECTED NOT DETECTED Final   Enterobacter cloacae complex NOT DETECTED NOT DETECTED Final   Escherichia  coli NOT DETECTED NOT DETECTED Final   Klebsiella oxytoca NOT DETECTED NOT DETECTED Final   Klebsiella pneumoniae NOT DETECTED NOT DETECTED Final   Proteus species NOT DETECTED NOT DETECTED Final   Serratia marcescens NOT DETECTED NOT DETECTED Final   Haemophilus influenzae NOT DETECTED NOT DETECTED Final   Neisseria meningitidis NOT DETECTED NOT DETECTED Final   Pseudomonas aeruginosa NOT DETECTED NOT DETECTED Final   Candida albicans NOT DETECTED NOT DETECTED Final   Candida glabrata NOT DETECTED NOT DETECTED Final   Candida krusei NOT DETECTED NOT DETECTED Final   Candida parapsilosis NOT DETECTED NOT DETECTED Final   Candida tropicalis NOT DETECTED NOT DETECTED Final    Comment: Performed at [facility], [address], [place], [place] [address]  Urine culture     Status: Abnormal   Collection Time: 03/20/19 10:52 PM   Specimen: Urine, Random  Result Value Ref Range Status   Specimen Description   Final    URINE, RANDOM Performed at [facility] [address], [place], [place] [address]    Special Requests   Final    NONE Performed at [facility], [place] [address] [place], [place] [address]    Culture (A)  Final    <10,000 COLONIES/mL INSIGNIFICANT GROWTH Performed at [place] [facility] [address] [place], [place] [address]    Report Status 03/22/2019 FINAL  Final  MRSA PCR Screening     Status: Abnormal   Collection Time: 03/21/19  1:45 AM   Specimen: Nasal Mucosa; Nasopharyngeal  Result Value Ref Range Status   MRSA by PCR POSITIVE (A) NEGATIVE Final    Comment:        The GeneXpert MRSA Assay (FDA approved for NASAL specimens only), is one component of a comprehensive MRSA colonization surveillance program. It is not intended to diagnose MRSA infection nor to guide or monitor treatment for MRSA infections. RESULT CALLED TO, READ BACK BY AND VERIFIED WITH: JOHNSON AT 0317 ON 03/21/2019 BY JPM Performed at  [place] [address] [place], [place] [address]   Culture, blood (routine x 2)     Status: None (Preliminary result)   Collection Time: 03/24/19  2:52 PM   Specimen: BLOOD  Result Value Ref Range Status   Specimen Description   Final    BLOOD RIGHT ANTECUBITAL Performed at [place] [address], [place], [place] [address]    Special Requests   Final    BOTTLES DRAWN AEROBIC AND ANAEROBIC Blood Culture adequate volume Performed at [facility] [address], [place], [place] [address]    Culture   Final    NO GROWTH < 24 HOURS Performed at [facility] [address] [place], [place] [address]    Report Status PENDING  Incomplete  Culture, blood (routine x 2)  Status: None (Preliminary result)   Collection Time: 03/24/19  2:52 PM   Specimen: BLOOD  Result Value Ref Range Status   Specimen Description   Final    BLOOD RIGHT HAND Performed at [facility] [address], [place], [place] [address]    Special Requests   Final    BOTTLES DRAWN AEROBIC ONLY Blood Culture adequate volume Performed at [facility] [address], [place], [place] [address]    Culture   Final    NO GROWTH < 24 HOURS Performed at [facility] [address] [place], [place] [address]    Report Status PENDING  Incomplete     Labs: Basic Metabolic Panel: Recent Labs  Lab 03/21/19 0244 03/22/19 0809 03/23/19 0436 03/24/19 0446 03/25/19 0422  NA 139 138 138 140 140  K 3.5 3.2* 3.4* 3.1* 3.5  CL 107 107 107 106 106  CO2 20* 25 22 24 26   GLUCOSE 119* 98 99 96 97  BUN 14 17 16 16 19   CREATININE 1.14 0.98 0.82 0.81 0.94  CALCIUM 8.2* 8.2* 7.9* 8.1* 8.3*  MG 1.6*  --   --  2.3 2.3   Liver Function Tests: Recent Labs  Lab 03/20/19 2147 03/24/19 0446 03/25/19 0422  AST 29 25 25   ALT 26 19 20   ALKPHOS 92 69 72  BILITOT 2.2* 1.0 0.9  PROT 8.2* 6.7 7.1   ALBUMIN 3.6 3.0* 3.0*   No results for input(s): LIPASE, AMYLASE in the last 168 hours. No results for input(s): AMMONIA in the last 168 hours. CBC: Recent Labs  Lab 03/20/19 2147 03/21/19 0244 03/22/19 0809 03/23/19 0436 03/24/19 0446 03/25/19 0422  WBC 13.3* 11.4* 9.5 6.2 4.5 4.7  NEUTROABS 12.0*  --  7.4 4.4  --   --   HGB 14.2 13.9 12.4* 12.6* 13.3 13.3  HCT 43.2 42.5 36.4* 36.9* 38.7* 38.6*  MCV 99.5 100.7* 100.0 98.4 98.5 98.2  PLT 84* 75* 71* 74* 93* 111*   Cardiac Enzymes: No results for input(s): CKTOTAL, CKMB, CKMBINDEX, TROPONINI in the last 168 hours. BNP: BNP (last 3 results) Recent Labs    03/21/19 0244 03/24/19 0446 03/25/19 0422  BNP 1,486.7* 220.6* 126.2*    ProBNP (last 3 results) No results for input(s): PROBNP in the last 8760 hours.  CBG: No results for input(s): GLUCAP in the last 168 hours.     Signed:  Fayrene Helper MD.  [facility] 03/25/2019, 6:53 PM

## 2019-03-25 NOTE — Progress Notes (Signed)
PT Cancellation Note  Patient Details Name: Eric Schultz MRN: [id number] DOB: 1941-07-02   Cancelled Treatment:    Reason Eval/Treat Not Completed: PT screened, no needs identified, will sign off Pt eating breakfast this morning however appeared agreeable to mobilize after.  Upon returning to room, pt immediately stated, "young lady, I'm not interested in any physical therapy.  You can tell them that."  Pt not agreeable at this time.  PT to sign off.   Demitra Danley,KATHrine E 03/25/2019, 10:05 AM Carmelia Bake, PT, DPT Acute Rehabilitation Services Office: [phone] Pager: [phone]

## 2019-03-25 NOTE — Evaluation (Signed)
Physical Therapy One Time Evaluation Patient Details Name: Eric Schultz MRN: [id number] DOB: 1940-12-29 Today's Date: 03/25/2019   History of Present Illness  78 y.o. male with medical history significant for hypertension, gout, thrombocytopenia, OSA, chronic venous stasis dermatitis of both lower extremities and admitted for Sepsis with acute respiratory failure with hypoxia and PNA  Clinical Impression  Patient evaluated by Physical Therapy with no further acute PT needs identified. All education has been completed and the patient has no further questions.  Pt seen along with MD to ensure pt able to ambulate prior to d/c.  Pt ambulated around room and declined further need for PT, also declined using any assistive devices. See below for any follow-up Physical Therapy or equipment needs. PT is signing off. Thank you for this referral.    Follow Up Recommendations No PT follow up    Equipment Recommendations  None recommended by PT    Recommendations for Other Services       Precautions / Restrictions Precautions Precautions: None      Mobility  Bed Mobility Overal bed mobility: Modified Independent             General bed mobility comments: uses elevated HOB and rail to self assist  Transfers Overall transfer level: Needs assistance Equipment used: None Transfers: Sit to/from Stand Sit to Stand: Supervision         General transfer comment: increased time and effort however no physical assist required  Ambulation/Gait Ambulation/Gait assistance: Min guard;Supervision Gait Distance (Feet): 45 Feet Assistive device: None Gait Pattern/deviations: Step-through pattern;Decreased stride length;Trunk flexed Gait velocity: decr   General Gait Details: slightly forward flexed posture however pt appears steady without assistive device, ambulated in room and around to recliner (MD present and observed)  Stairs            Wheelchair Mobility    Modified Rankin  (Stroke Patients Only)       Balance Overall balance assessment: Needs assistance         Standing balance support: No upper extremity supported Standing balance-Leahy Scale: Good                 High Level Balance Comments: pt reports some balance difficulties at baseline however declined wanting to perform balance activites to improve this and also declined using SPC/RW             Pertinent Vitals/Pain Pain Assessment: No/denies pain    Home Living                   Additional Comments: Pt did not state, only reports he is ambulatory without assistive device at baseline    Prior Function Level of Independence: Independent               Hand Dominance        Extremity/Trunk Assessment        Lower Extremity Assessment Lower Extremity Assessment: Overall WFL for tasks assessed       Communication   Communication: No difficulties  Cognition Arousal/Alertness: Awake/alert   Overall Cognitive Status: Within Functional Limits for tasks assessed                                 General Comments: pt can become easily agitated      General Comments      Exercises     Assessment/Plan    PT Assessment Patent does not  need any further PT services  PT Problem List         PT Treatment Interventions      PT Goals (Current goals can be found in the Care Plan section)  Acute Rehab PT Goals PT Goal Formulation: All assessment and education complete, DC therapy    Frequency     Barriers to discharge        Co-evaluation               AM-PAC PT "6 Clicks" Mobility  Outcome Measure Help needed turning from your back to your side while in a flat bed without using bedrails?: None Help needed moving from lying on your back to sitting on the side of a flat bed without using bedrails?: None Help needed moving to and from a bed to a chair (including a wheelchair)?: None Help needed standing up from a chair using  your arms (e.g., wheelchair or bedside chair)?: A Little Help needed to walk in hospital room?: A Little Help needed climbing 3-5 steps with a railing? : A Little 6 Click Score: 21    End of Session   Activity Tolerance: Patient tolerated treatment well Patient left: in chair;with call bell/phone within reach Nurse Communication: Mobility status PT Visit Diagnosis: Difficulty in walking, not elsewhere classified (R26.2)    Time: 4920-1007 PT Time Calculation (min) (ACUTE ONLY): 10 min   Charges:   PT Evaluation $PT Eval Low Complexity: [place], PT, DPT Acute Rehabilitation Services Office: [phone] Pager: [phone]  Trena Platt 03/25/2019, 12:30 PM

## 2019-03-25 NOTE — Progress Notes (Addendum)
Pt discharging home with no follow. Pt declined home health. Baldwin [place] with APS  [phone] was called and left VM. Pt declined HH and was dishcarged.

## 2019-03-25 NOTE — Progress Notes (Signed)
Report received from L.Saunders,RN.  No change in assessment. Eric Schultz

## 2019-03-25 NOTE — Progress Notes (Signed)
[facility] for Infectious Disease   Reason for visit: Follow up on GNR BSI  Antibiotics: Rocephin, day 3 + 1 day of cefepime  Interval History: feeling better and now anxious to go home. Ambulated using walker with PT this morning. Breathing and appetite both improved. Fever curve, WBC, Cr, and lactic acid trends, ABX usage, blood cx results, and imaging all independently reviewed    Current Facility-Administered Medications:  .  acetaminophen (TYLENOL) tablet 650 mg, 650 mg, Oral, Q4H PRN, Schorr, Rhetta Mura, NP, 650 mg at 03/21/19 2246 .  alum & mag hydroxide-simeth (MAALOX/MYLANTA) 200-200-20 MG/5ML suspension 30 mL, 30 mL, Oral, Q4H PRN, Elodia Florence., MD, 30 mL at 03/24/19 1615 .  chlorhexidine (PERIDEX) 0.12 % solution 15 mL, 15 mL, Mouth Rinse, BID, Zada Finders R, MD, 15 mL at 03/24/19 2229 .  Chlorhexidine Gluconate Cloth 2 % PADS 6 each, 6 each, Topical, Daily, Lenore Cordia, MD, 6 each at 03/24/19 1638 .  indomethacin (INDOCIN) capsule 25 mg, 25 mg, Oral, TID PRN, Elodia Florence., MD, 25 mg at 03/24/19 2230 .  MEDLINE mouth rinse, 15 mL, Mouth Rinse, q12n4p, Lenore Cordia, MD, 15 mL at 03/21/19 1557   Physical Exam:   Vitals:   03/24/19 2028 03/25/19 0636  BP: (!) 150/77 138/87  Pulse: 61 (!) 56  Resp: 18 20  Temp: 97.8 F (36.6 C) 98 F (36.7 C)  SpO2: 92% 93%   Physical Exam Gen: pleasant, obese, NAD, A&Ox 3, recall poor at times Head: NCAT, no temporal wasting evident EENT: PERRL, EOMI, MMM, adequate dentition Neck: supple, no JVD CV: NRRR, no murmurs evident but heart sounds distant due to body habitus Pulm: CTA bilaterally except for decreased BS at RT base, no wheeze or retractions Abd: soft, obese, NTND, +BS Extrems:  1+ non-pitting LE edema, 2+ pulses Skin: both LEs with heavy lichenification (LT>RT) but no cellulitis/erythema noted at present, poor skin turgor Neuro: CN II-XII grossly intact, no focal neurologic deficits  appreciated, gait was not assessed, A&Ox 3, conversation rather tangential/meandering, recall poor at times  Review of Systems:  Review of Systems  Constitutional: Positive for malaise/fatigue. Negative for chills, fever and weight loss.  HENT: Negative for congestion, hearing loss, sinus pain and sore throat.   Eyes: Negative for blurred vision, photophobia and discharge.  Respiratory: Positive for shortness of breath. Negative for cough and hemoptysis.   Cardiovascular: Negative for chest pain, palpitations, orthopnea and leg swelling.  Gastrointestinal: Negative for abdominal pain, constipation, diarrhea, heartburn, nausea and vomiting.  Genitourinary: Negative for dysuria, flank pain, frequency and urgency.  Musculoskeletal: Negative for back pain, joint pain and myalgias.  Skin: Positive for rash. Negative for itching.       Chronic venous stasis dermatitis, LT > RT  Neurological: Positive for weakness. Negative for tremors, seizures and headaches.  Endo/Heme/Allergies: Negative for polydipsia. Does not bruise/bleed easily.  Psychiatric/Behavioral: Positive for depression. Negative for substance abuse. The patient is not nervous/anxious and does not have insomnia.      Lab Results  Component Value Date   WBC 4.7 03/25/2019   HGB 13.3 03/25/2019   HCT 38.6 (L) 03/25/2019   MCV 98.2 03/25/2019   PLT 111 (L) 03/25/2019    Lab Results  Component Value Date   CREATININE 0.94 03/25/2019   BUN 19 03/25/2019   NA 140 03/25/2019   K 3.5 03/25/2019   CL 106 03/25/2019   CO2 26 03/25/2019    Lab  Results  Component Value Date   ALT 20 03/25/2019   AST 25 03/25/2019   ALKPHOS 72 03/25/2019     Microbiology: Recent Results (from the past 240 hour(s))  Blood culture (routine x 2)     Status: Abnormal   Collection Time: 03/20/19  9:47 PM   Specimen: BLOOD  Result Value Ref Range Status   Specimen Description   Final    BLOOD LEFT ANTECUBITAL Performed at [facility] [address], [place], [place] [address]    Special Requests   Final    BOTTLES DRAWN AEROBIC AND ANAEROBIC Blood Culture adequate volume Performed at [facility] [address], [place], [place] [address]    Culture  Setup Time   Final    AEROBIC BOTTLE ONLY GRAM NEGATIVE RODS CRITICAL VALUE NOTED.  VALUE IS CONSISTENT WITH PREVIOUSLY REPORTED AND CALLED VALUE.    Culture (A)  Final    SPHINGOMONAS PAUCIMOBILIS SUSCEPTIBILITIES PERFORMED ON PREVIOUS CULTURE WITHIN THE LAST 5 DAYS. Performed at [facility], [place] [address], [place], [place] [address]    Report Status 03/24/2019 FINAL  Final  SARS Coronavirus 2 (CEPHEID - Performed in [facility] hospital lab), Hosp Order     Status: None   Collection Time: 03/20/19  9:47 PM   Specimen: Nasopharyngeal Swab  Result Value Ref Range Status   SARS Coronavirus 2 NEGATIVE NEGATIVE Final    Comment: (NOTE) If result is NEGATIVE SARS-CoV-2 target nucleic acids are NOT DETECTED. The SARS-CoV-2 RNA is generally detectable in upper and lower  respiratory specimens during the acute phase of infection. The lowest  concentration of SARS-CoV-2 viral copies this assay can detect is 250  copies / mL. A negative result does not preclude SARS-CoV-2 infection  and should not be used as the sole basis for treatment or other  patient management decisions.  A negative result may occur with  improper specimen collection / handling, submission of specimen other  than nasopharyngeal swab, presence of viral mutation(s) within the  areas targeted by this assay, and inadequate number of viral copies  (<250 copies / mL). A negative result must be combined with clinical  observations, patient history, and epidemiological information. If result is POSITIVE SARS-CoV-2 target nucleic acids are DETECTED. The SARS-CoV-2 RNA is generally detectable in upper and lower  respiratory specimens dur ing the acute phase of  infection.  Positive  results are indicative of active infection with SARS-CoV-2.  Clinical  correlation with patient history and other diagnostic information is  necessary to determine patient infection status.  Positive results do  not rule out bacterial infection or co-infection with other viruses. If result is PRESUMPTIVE POSTIVE SARS-CoV-2 nucleic acids MAY BE PRESENT.   A presumptive positive result was obtained on the submitted specimen  and confirmed on repeat testing.  While 2019 novel coronavirus  (SARS-CoV-2) nucleic acids may be present in the submitted sample  additional confirmatory testing may be necessary for epidemiological  and / or clinical management purposes  to differentiate between  SARS-CoV-2 and other Sarbecovirus currently known to infect humans.  If clinically indicated additional testing with an alternate test  methodology [phone]) is advised. The SARS-CoV-2 RNA is generally  detectable in upper and lower respiratory sp ecimens during the acute  phase of infection. The expected result is Negative. Fact Sheet for Patients:  StrictlyIdeas.no Fact Sheet for Healthcare Providers: BankingDealers.co.za This test is not yet approved or cleared by the [place] FDA  and has been authorized for detection and/or diagnosis of SARS-CoV-2 by FDA under an Emergency Use Authorization (EUA).  This EUA will remain in effect (meaning this test can be used) for the duration of the COVID-19 declaration under Section 564(b)(1) of the Act, 21 U.S.C. section 360bbb-3(b)(1), unless the authorization is terminated or revoked sooner. Performed at [facility], [place] [address], [place], [place] [address]   Blood culture (routine x 2)     Status: Abnormal   Collection Time: 03/20/19  9:52 PM   Specimen: BLOOD LEFT FOREARM  Result Value Ref Range Status   Specimen Description   Final    BLOOD LEFT FOREARM  Performed at [facility] [address], [place], [place] [address]    Special Requests   Final    BOTTLES DRAWN AEROBIC AND ANAEROBIC Blood Culture results may not be optimal due to an inadequate volume of blood received in culture bottles Performed at [facility] [address], [place], [place] [address]    Culture  Setup Time   Final    IN BOTH AEROBIC AND ANAEROBIC BOTTLES GRAM NEGATIVE RODS CRITICAL RESULT CALLED TO, READ BACK BY AND VERIFIED WITH: Lavell Luster [facility] 03/22/19 0106 JDW Performed at [facility], [place] [address] [place], [place] [address]    Culture SPHINGOMONAS PAUCIMOBILIS (A)  Final   Report Status 03/24/2019 FINAL  Final   Organism ID, Bacteria SPHINGOMONAS PAUCIMOBILIS  Final      Susceptibility   Sphingomonas paucimobilis - MIC*    CEFEPIME <=1 SENSITIVE Sensitive     CEFAZOLIN <=4 SENSITIVE Sensitive     GENTAMICIN >=16 RESISTANT Resistant     CIPROFLOXACIN <=0.25 SENSITIVE Sensitive     IMIPENEM <=0.25 SENSITIVE Sensitive     TRIMETH/SULFA <=20 SENSITIVE Sensitive     * SPHINGOMONAS PAUCIMOBILIS  Blood Culture ID Panel (Reflexed)     Status: None   Collection Time: 03/20/19  9:52 PM  Result Value Ref Range Status   Enterococcus species NOT DETECTED NOT DETECTED Final   Listeria monocytogenes NOT DETECTED NOT DETECTED Final   Staphylococcus species NOT DETECTED NOT DETECTED Final   Staphylococcus aureus (BCID) NOT DETECTED NOT DETECTED Final   Streptococcus species NOT DETECTED NOT DETECTED Final   Streptococcus agalactiae NOT DETECTED NOT DETECTED Final   Streptococcus pneumoniae NOT DETECTED NOT DETECTED Final   Streptococcus pyogenes NOT DETECTED NOT DETECTED Final   Acinetobacter baumannii NOT DETECTED NOT DETECTED Final   Enterobacteriaceae species NOT DETECTED NOT DETECTED Final   Enterobacter cloacae complex NOT DETECTED NOT DETECTED Final   Escherichia coli NOT DETECTED NOT DETECTED Final    Klebsiella oxytoca NOT DETECTED NOT DETECTED Final   Klebsiella pneumoniae NOT DETECTED NOT DETECTED Final   Proteus species NOT DETECTED NOT DETECTED Final   Serratia marcescens NOT DETECTED NOT DETECTED Final   Haemophilus influenzae NOT DETECTED NOT DETECTED Final   Neisseria meningitidis NOT DETECTED NOT DETECTED Final   Pseudomonas aeruginosa NOT DETECTED NOT DETECTED Final   Candida albicans NOT DETECTED NOT DETECTED Final   Candida glabrata NOT DETECTED NOT DETECTED Final   Candida krusei NOT DETECTED NOT DETECTED Final   Candida parapsilosis NOT DETECTED NOT DETECTED Final   Candida tropicalis NOT DETECTED NOT DETECTED Final    Comment: Performed at [facility], [place] [address], [place], [place] [address]  Urine culture     Status: Abnormal   Collection Time: 03/20/19 10:52 PM   Specimen:  Urine, Random  Result Value Ref Range Status   Specimen Description   Final    URINE, RANDOM Performed at [facility] [address], [place], [place] [address]    Special Requests   Final    NONE Performed at [facility], [place] [address] [place], [place] [address]    Culture (A)  Final    <10,000 COLONIES/mL INSIGNIFICANT GROWTH Performed at [facility] [address] [place], [place] [address]    Report Status 03/22/2019 FINAL  Final  MRSA PCR Screening     Status: Abnormal   Collection Time: 03/21/19  1:45 AM   Specimen: Nasal Mucosa; Nasopharyngeal  Result Value Ref Range Status   MRSA by PCR POSITIVE (A) NEGATIVE Final    Comment:        The GeneXpert MRSA Assay (FDA approved for NASAL specimens only), is one component of a comprehensive MRSA colonization surveillance program. It is not intended to diagnose MRSA infection nor to guide or monitor treatment for MRSA infections. RESULT CALLED TO, READ BACK BY AND VERIFIED WITH: JOHNSON AT 0317 ON 03/21/2019 BY JPM Performed at [facility]  [address] [place], [place] [address]   Culture, blood (routine x 2)     Status: None (Preliminary result)   Collection Time: 03/24/19  2:52 PM   Specimen: BLOOD  Result Value Ref Range Status   Specimen Description   Final    BLOOD RIGHT ANTECUBITAL Performed at [facility] [address], [place], [place] [address]    Special Requests   Final    BOTTLES DRAWN AEROBIC AND ANAEROBIC Blood Culture adequate volume Performed at [facility] [address], [place], [place] [address]    Culture   Final    NO GROWTH < 24 HOURS Performed at [facility] [address], [place], [place] [address]    Report Status PENDING  Incomplete  Culture, blood (routine x 2)     Status: None (Preliminary result)   Collection Time: 03/24/19  2:52 PM   Specimen: BLOOD  Result Value Ref Range Status   Specimen Description   Final    BLOOD RIGHT HAND Performed at [facility] [address], [place], [place] [address]    Special Requests   Final    BOTTLES DRAWN AEROBIC ONLY Blood Culture adequate volume Performed at [facility] [address] [place], [place] [address]    Culture   Final    NO GROWTH < 24 HOURS Performed at [facility] [address], [place],  [address]    Report Status PENDING  Incomplete  HIV Ab - negative  HCV Ab - negative BNP - 1,486.7 (7/27)-->126.2 (7/31)  Impression/Plan: The patient is a 78 y/o WM with gout, thrombocytopenia, and chronic venous stasis dermatitis admitted with fever, leukocytosis, and Sphingomonas sepsis.  1. Sphingomonas sepsis - No clear source of his bacteremia. This pathogen is rare to cause human disease in immunocompetent hosts, which does raise concern about his immune health and possible hepatopathy. Most often, this arises from a foot wound but no open lesions are observed. As both of his blood cxs were positive for this pathogen, and he had  documented fever with leukocytosis and lactic acidosis, I do believe he had true sepsis and would NOT disregard this as a simple contaminant. He does have heavy lichenification to both of his LEs but  no open wound or abscess is observed. Continue rocephin for now as this would be adequate. Repeat blood cxs x 2 to confirm clearance of his BSI. At the time of hospital d/c, may transition him to PO levaquin 750 mg daily to complete 2 weeks of treatment in total. Tentative ABX d/c date is 04/03/2019.  2. Fever/leukocytosis - both promptly resolved with empiric ABX. Repeat blood cxs as noted above to document clearance of his BSI. Check CBC w/ diff regularly now that WBCs are WNL.  3. Thrombocytopenia - Worrisome for cryptogenic or congestive hepatopathy when viewed in conjunction with his venous stasis dermatitis/chronic LE lymphedema. HIV & HCV Abs were both negative. Consider RUQ U/S as an OP to further evaluate as well. Pt denies heavy alcohol consumption now or in the past. Alternatively, his CHF could be causing a similar issue with his LE edema but this would be unlikely to cause low platelets.

## 2019-03-29 LAB — CULTURE, BLOOD (ROUTINE X 2)
Culture: NO GROWTH
Culture: NO GROWTH
Special Requests: ADEQUATE
Special Requests: ADEQUATE

## 2019-09-08 ENCOUNTER — Emergency Department (HOSPITAL_COMMUNITY)
Admission: EM | Admit: 2019-09-08 | Discharge: 2019-09-08 | Disposition: A | Discharge status: Home / Self Care | Payer: No Typology Code available for payment source | Attending: Emergency Medicine | Admitting: Emergency Medicine

## 2019-09-08 ENCOUNTER — Other Ambulatory Visit: Payer: Self-pay

## 2019-09-08 DIAGNOSIS — Z20822 Contact with and (suspected) exposure to covid-19: Secondary | ICD-10-CM | POA: Diagnosis not present

## 2019-09-08 DIAGNOSIS — I10 Essential (primary) hypertension: Secondary | ICD-10-CM | POA: Diagnosis not present

## 2019-09-08 DIAGNOSIS — R6883 Chills (without fever): Secondary | ICD-10-CM

## 2019-09-08 DIAGNOSIS — Z79899 Other long term (current) drug therapy: Secondary | ICD-10-CM | POA: Diagnosis not present

## 2019-09-08 NOTE — ED Provider Notes (Signed)
[name]GENC[facility] DEPARTMENT Provider Note   CSN: [id number] Arrival date & time: 09/08/19  1217     History COVID testing  Eric Schultz is a 79 y.o. male with a history of hypertension, sleep apnea, & venous insufficiency who presents to the emergency department with request for Covid testing.  Patient states that he developed chills few days ago that are improving and almost resolved at this point.  No alleviating or aggravating factors.  No intervention prior to arrival.  He states he wanted to be checked for coronavirus.  He was concerned he could have had a fever from the chills, but did not take his temperature.  Denies ear pain, sore throat, nasal congestion, cough, dyspnea, chest pain, abdominal pain, vomiting, diarrhea, or dysuria.  No known COVID-19 exposures.  HPI     Past Medical History:  Diagnosis Date  . Cellulitis    left lower leg  . Gout   . Hypertension    "borderline"  . Sleep apnea     Patient Active Problem List   Diagnosis Date Noted  . Community acquired pneumonia 03/21/2019  . Cellulitis of foot, left 03/21/2019  . Chronic venous stasis dermatitis of both lower extremities 03/21/2019  . AKI (acute kidney injury) ([place]) 03/21/2019  . Acute on chronic respiratory failure with hypoxia ([place]) 03/21/2019  . Hypokalemia 02/09/2014    No past surgical history on file.     Family History  Problem Relation Age of Onset  . Heart failure Mother   . Heart failure Father     Social History   Tobacco Use  . Smoking status: Never Smoker  . Smokeless tobacco: Never Used  Substance Use Topics  . Alcohol use: No  . Drug use: No    Home Medications Prior to Admission medications   Medication Sig Start Date End Date Taking? Authorizing Provider  furosemide (LASIX) 20 MG tablet Take 1 tablet (20 mg total) by mouth daily. (please follow up with your PCP to check labs and weight within 1 week) 03/25/19 04/24/19  Elodia Florence., MD  polyethylene glycol powder (GLYCOLAX/MIRALAX) powder Take 17 g by mouth daily. Patient not taking: Reported on 03/21/2019 07/14/18   Wieters, Madelynn Done C, PA-C  potassium chloride SA (K-DUR) 20 MEQ tablet Take 1 tablet (20 mEq total) by mouth daily for 14 days. (follow up with your PCP for labs within 1 week) 03/25/19 04/08/19  Elodia Florence., MD    Allergies    Sulfa antibiotics  Review of Systems   Review of Systems  Constitutional: Positive for chills.  HENT: Negative for congestion, ear pain and sore throat.   Respiratory: Negative for cough and shortness of breath.   Cardiovascular: Negative for chest pain.  Gastrointestinal: Negative for abdominal pain, blood in stool, constipation, diarrhea and vomiting.  Genitourinary: Negative for dysuria.  Neurological: Negative for syncope.    Physical Exam Updated Vital Signs BP (!) 160/98   Pulse (!) 55   Temp 98.6 F (37 C)   Resp 18   SpO2 98%   Physical Exam Vitals and nursing note reviewed.  Constitutional:      General: He is not in acute distress.    Appearance: He is well-developed. He is not toxic-appearing.  HENT:     Head: Normocephalic and atraumatic.     Right Ear: Ear canal normal. Tympanic membrane is not perforated, erythematous, retracted or bulging.     Left Ear: Ear canal normal. Tympanic  membrane is not perforated, erythematous, retracted or bulging.     Ears:     Comments: No mastoid erythema/swellng/tenderness.     Nose:     Right Sinus: No maxillary sinus tenderness or frontal sinus tenderness.     Left Sinus: No maxillary sinus tenderness or frontal sinus tenderness.     Mouth/Throat:     Pharynx: Oropharynx is clear. Uvula midline. No oropharyngeal exudate or posterior oropharyngeal erythema.     Comments: Posterior oropharynx is symmetric appearing. Patient tolerating own secretions without difficulty. No trismus. No drooling. No hot potato voice. No swelling beneath the tongue,  submandibular compartment is soft.  Eyes:     General:        Right eye: No discharge.        Left eye: No discharge.     Conjunctiva/sclera: Conjunctivae normal.  Cardiovascular:     Rate and Rhythm: Regular rhythm. Bradycardia present.  Pulmonary:     Effort: Pulmonary effort is normal. No respiratory distress.     Breath sounds: Normal breath sounds. No wheezing, rhonchi or rales.  Abdominal:     General: There is no distension.     Palpations: Abdomen is soft.     Tenderness: There is no abdominal tenderness.  Musculoskeletal:     Cervical back: Neck supple. No rigidity.     Comments: Venous stasis changes noted to the lower legs.  Lymphadenopathy:     Cervical: No cervical adenopathy.  Skin:    General: Skin is warm and dry.     Findings: No rash.  Neurological:     Mental Status: He is alert.  Psychiatric:        Behavior: Behavior normal.     ED Results / Procedures / Treatments   Labs (all labs ordered are listed, but only abnormal results are displayed) Labs Reviewed  NOVEL CORONAVIRUS, NAA (HOSP ORDER, SEND-OUT TO REF LAB; TAT 18-24 HRS)    EKG None  Radiology No results found.  Procedures Procedures (including critical care time)  Medications Ordered in ED Medications - No data to display  ED Course  I have reviewed the triage vital signs and the nursing notes.  Pertinent labs & imaging results that were available during my care of the patient were reviewed by me and considered in my medical decision making (see chart for details).    Eric Schultz was evaluated in Emergency Department on 09/08/2019 for the symptoms described in the history of present illness. He/she was evaluated in the context of the global COVID-19 pandemic, which necessitated consideration that the patient might be at risk for infection with the SARS-CoV-2 virus that causes COVID-19. Institutional protocols and algorithms that pertain to the evaluation of patients at risk for  COVID-19 are in a state of rapid change based on information released by regulatory bodies including the CDC and federal and state organizations. These policies and algorithms were followed during the patient's care in the ED.   MDM Rules/Calculators/A&P                      Patient presents to the emergency department requesting COVID-19 testing secondary to chills for the past few days which are improved at present.  He is nontoxic-appearing, resting comfortably, vitals WNL with exception of elevated BP, doubt HTN emergency, mild bradycardia.  No respiratory complaints, lungs are clear to auscultation bilaterally, do not suspect bacterial pneumonia.  No meningismus.  Abdomen nontender without peritoneal signs.  No urinary  complaints.  Venous stasis changes to lower extremities are at baseline per patient report, no signs of superimposed infection.  Patient is afebrile in the emergency department.  He is having improvement in his symptoms.  Will obtain Covid test.  Patient appears appropriate for discharge home. I discussed plan, need for follow-up, and return precautions with the patient. Provided opportunity for questions, patient confirmed understanding and is in agreement with plan.   Findings and plan of care discussed with supervising physician Dr. Sedonia Small who has evaluated patient & is in agreement.   Final Clinical Impression(s) / ED Diagnoses Final diagnoses:  Chills    Rx / DC Orders ED Discharge Orders    None       Amaryllis Dyke, PA-C 09/08/19 1352    Maudie Flakes, MD 09/12/19 [phone]

## 2019-09-08 NOTE — Discharge Instructions (Addendum)
We have tested you for COVID 19, we will call you within the next 72 hours if results are positive, you may also view these results on MyChart.   We are instructing patient's with COVID 19 or symptoms of COVID 19 to quarantine themselves for 14 days. You may be able to discontinue self quarantine if the following conditions are met:   Persons with COVID-19 who have symptoms and were directed to care for themselves at home may discontinue home isolation under the  following conditions: - It has been at least 7 days have passed since symptoms first appeared. - AND at least 3 days (72 hours) have passed since recovery defined as resolution of fever without the use of fever-reducing medications and improvement in respiratory symptoms (e.g., cough, shortness of breath)  Please follow the below quarantine instructions.   Please follow up with primary care within 1 week for re-evaluation- call prior to going to the office to make them aware of your symptoms as some offices are altering their method of seeing patients with COVID 19 symptoms. Return to the ER for new or worsening symptoms including but not limited to increased work of breathing, chest pain, passing out, or any other concerns.       Person Under Monitoring Name: Eric Schultz  Location: [place] [address]   Infection Prevention Recommendations for Individuals Confirmed to have, or Being Evaluated for, 2019 Novel Coronavirus (COVID-19) Infection Who Receive Care at Home  Individuals who are confirmed to have, or are being evaluated for, COVID-19 should follow the prevention steps below until a healthcare provider or local or state health department says they can return to normal activities.  Stay home except to get medical care You should restrict activities outside your home, except for getting medical care. Do not go to work, school, or public areas, and do not use public transportation or taxis.  Call ahead  before visiting your doctor Before your medical appointment, call the healthcare provider and tell them that you have, or are being evaluated for, COVID-19 infection. This will help the healthcare provider's office take steps to keep other people from getting infected. Ask your healthcare provider to call the local or state health department.  Monitor your symptoms Seek prompt medical attention if your illness is worsening (e.g., difficulty breathing). Before going to your medical appointment, call the healthcare provider and tell them that you have, or are being evaluated for, COVID-19 infection. Ask your healthcare provider to call the local or state health department.  Wear a facemask You should wear a facemask that covers your nose and mouth when you are in the same room with other people and when you visit a healthcare provider. People who live with or visit you should also wear a facemask while they are in the same room with you.  Separate yourself from other people in your home As much as possible, you should stay in a different room from other people in your home. Also, you should use a separate bathroom, if available.  Avoid sharing household items You should not share dishes, drinking glasses, cups, eating utensils, towels, bedding, or other items with other people in your home. After using these items, you should wash them thoroughly with soap and water.  Cover your coughs and sneezes Cover your mouth and nose with a tissue when you cough or sneeze, or you can cough or sneeze into your sleeve. Throw used tissues in a lined trash can, and immediately  wash your hands with soap and water for at least 20 seconds or use an alcohol-based hand rub.  Wash your [facility] your hands often and thoroughly with soap and water for at least 20 seconds. You can use an alcohol-based hand sanitizer if soap and water are not available and if your hands are not visibly dirty. Avoid touching  your eyes, nose, and mouth with unwashed hands.   Prevention Steps for Caregivers and Household Members of Individuals Confirmed to have, or Being Evaluated for, COVID-19 Infection Being Cared for in the Home  If you live with, or provide care at home for, a person confirmed to have, or being evaluated for, COVID-19 infection please follow these guidelines to prevent infection:  Follow healthcare provider's instructions Make sure that you understand and can help the patient follow any healthcare provider instructions for all care.  Provide for the patient's basic needs You should help the patient with basic needs in the home and provide support for getting groceries, prescriptions, and other personal needs.  Monitor the patient's symptoms If they are getting sicker, call his or her medical provider and tell them that the patient has, or is being evaluated for, COVID-19 infection. This will help the healthcare provider's office take steps to keep other people from getting infected. Ask the healthcare provider to call the local or state health department.  Limit the number of people who have contact with the patient If possible, have only one caregiver for the patient. Other household members should stay in another home or place of residence. If this is not possible, they should stay in another room, or be separated from the patient as much as possible. Use a separate bathroom, if available. Restrict visitors who do not have an essential need to be in the home.  Keep older adults, very young children, and other sick people away from the patient Keep older adults, very young children, and those who have compromised immune systems or chronic health conditions away from the patient. This includes people with chronic heart, lung, or kidney conditions, diabetes, and cancer.  Ensure good ventilation Make sure that shared spaces in the home have good air flow, such as from an air conditioner  or an opened window, weather permitting.  Wash your hands often Wash your hands often and thoroughly with soap and water for at least 20 seconds. You can use an alcohol based hand sanitizer if soap and water are not available and if your hands are not visibly dirty. Avoid touching your eyes, nose, and mouth with unwashed hands. Use disposable paper towels to dry your hands. If not available, use dedicated cloth towels and replace them when they become wet.  Wear a facemask and gloves Wear a disposable facemask at all times in the room and gloves when you touch or have contact with the patient's blood, body fluids, and/or secretions or excretions, such as sweat, saliva, sputum, nasal mucus, vomit, urine, or feces.  Ensure the mask fits over your nose and mouth tightly, and do not touch it during use. Throw out disposable facemasks and gloves after using them. Do not reuse. Wash your hands immediately after removing your facemask and gloves. If your personal clothing becomes contaminated, carefully remove clothing and launder. Wash your hands after handling contaminated clothing. Place all used disposable facemasks, gloves, and other waste in a lined container before disposing them with other household waste. Remove gloves and wash your hands immediately after handling these items.  Do not share  dishes, glasses, or other household items with the patient Avoid sharing household items. You should not share dishes, drinking glasses, cups, eating utensils, towels, bedding, or other items with a patient who is confirmed to have, or being evaluated for, COVID-19 infection. After the person uses these items, you should wash them thoroughly with soap and water.  Wash laundry thoroughly Immediately remove and wash clothes or bedding that have blood, body fluids, and/or secretions or excretions, such as sweat, saliva, sputum, nasal mucus, vomit, urine, or feces, on them. Wear gloves when handling laundry  from the patient. Read and follow directions on labels of laundry or clothing items and detergent. In general, wash and dry with the warmest temperatures recommended on the label.  Clean all areas the individual has used often Clean all touchable surfaces, such as counters, tabletops, doorknobs, bathroom fixtures, toilets, phones, keyboards, tablets, and bedside tables, every day. Also, clean any surfaces that may have blood, body fluids, and/or secretions or excretions on them. Wear gloves when cleaning surfaces the patient has come in contact with. Use a diluted bleach solution (e.g., dilute bleach with 1 part bleach and 10 parts water) or a household disinfectant with a label that says EPA-registered for coronaviruses. To make a bleach solution at home, add 1 tablespoon of bleach to 1 quart (4 cups) of water. For a larger supply, add  cup of bleach to 1 gallon (16 cups) of water. Read labels of cleaning products and follow recommendations provided on product labels. Labels contain instructions for safe and effective use of the cleaning product including precautions you should take when applying the product, such as wearing gloves or eye protection and making sure you have good ventilation during use of the product. Remove gloves and wash hands immediately after cleaning.  Monitor yourself for signs and symptoms of illness Caregivers and household members are considered close contacts, should monitor their health, and will be asked to limit movement outside of the home to the extent possible. Follow the monitoring steps for close contacts listed on the symptom monitoring form.   ? If you have additional questions, contact your local health department or call the epidemiologist on call at [phone] (available 24/7). ? This guidance is subject to change. For the most up-to-date guidance from [facility], please refer to their  website: [email or web address].pl

## 2019-09-08 NOTE — ED Triage Notes (Addendum)
Pt presents from home, states concern for covid, has been having fever/chills since Monday. Denies sob, CP, congestion, cough, weakness or any other sx. No known covid contacts. Pt has not taken any medications for fever. Afebrile in triage.

## 2019-09-08 NOTE — ED Notes (Signed)
Patient verbalizes understanding of discharge instructions. Opportunity for questioning and answers were provided. Armband removed by staff, pt discharged from ED to home via POV. Counseled about pending covid test and CDC isolation instructions

## 2019-09-09 LAB — NOVEL CORONAVIRUS, NAA (HOSP ORDER, SEND-OUT TO REF LAB; TAT 18-24 HRS): SARS-CoV-2, NAA: NOT DETECTED

## 2019-09-29 ENCOUNTER — Encounter (HOSPITAL_COMMUNITY): Payer: Self-pay | Admitting: Emergency Medicine

## 2019-09-29 ENCOUNTER — Other Ambulatory Visit: Payer: Self-pay

## 2019-09-29 ENCOUNTER — Ambulatory Visit (HOSPITAL_COMMUNITY)
Admission: EM | Admit: 2019-09-29 | Discharge: 2019-09-29 | Disposition: A | Discharge status: Home / Self Care | Payer: Medicare PPO | Attending: Family Medicine | Admitting: Family Medicine

## 2019-09-29 DIAGNOSIS — M545 Low back pain, unspecified: Secondary | ICD-10-CM

## 2019-09-29 DIAGNOSIS — R030 Elevated blood-pressure reading, without diagnosis of hypertension: Secondary | ICD-10-CM

## 2019-09-29 HISTORY — DX: Unspecified hearing loss, unspecified ear: H91.90

## 2019-09-29 MED ORDER — MELOXICAM 15 MG PO TABS: 15.0000 mg | ORAL_TABLET | Freq: Every day | ORAL | 0 refills | Status: DC

## 2019-09-29 MED ORDER — PREDNISONE 10 MG (21) PO TBPK: ORAL_TABLET | Freq: Every day | ORAL | 0 refills | Status: DC

## 2019-09-29 NOTE — ED Provider Notes (Signed)
[place]   [id number] 09/29/19 Arrival Time: [id number]  ASSESSMENT & PLAN:  1. Acute right-sided low back pain without sciatica   2. Elevated blood pressure reading without diagnosis of hypertension      Able to ambulate here and hemodynamically stable. No indication for imaging of back at this time given no trauma and normal neurological exam. Discussed.  Begin trial of: Meds ordered this encounter  Medications  . predniSONE (STERAPRED UNI-PAK 21 TAB) 10 MG (21) TBPK tablet    Sig: Take by mouth daily. Take as directed.    Dispense:  21 tablet    Refill:  0  . meloxicam (MOBIC) 15 MG tablet    Sig: Take 1 tablet (15 mg total) by mouth daily.    Dispense:  14 tablet    Refill:  0    Encourage ROM/movement as tolerated.  Recommend: Follow-up Information    [facility].   Why: If worsening or failing to improve as anticipated. Contact information: [address] [place] [id number]            Discharge Instructions     Your blood pressure was noted to be elevated during your visit today. You may return here within the next few days to recheck if unable to see your primary care doctor. If your blood pressure remains persistently elevated, you may need to begin taking a medication.  BP (!) 153/77 (BP Location: Right Arm) Comment (BP Location): large cuff  Pulse 62   Temp 98.1 F (36.7 C) (Oral)   Resp (!) 24   SpO2 98%   HOME CARE INSTRUCTIONS: For many people, back pain returns. Since low back pain is rarely dangerous, it is often a condition that people can learn to manage on their own. Please remain active. It is stressful on the back to sit or stand in one place. Do not sit, drive, or stand in one place for more than 30 minutes at a time. Take short walks on level surfaces as soon as pain allows. Try to increase the length of time you walk each day. Do not stay in bed. Resting more than 1 or  2 days can delay your recovery. Do not avoid exercise or work. Your body is made to move. It is not dangerous to be active, even though your back may hurt. Your back will likely heal faster if you return to being active before your pain is gone. Over-the-counter medicines to reduce pain and inflammation are often the most helpful.  SEEK MEDICAL CARE IF: You have pain that is not relieved with rest or medicine. You have pain that does not improve in 1 week. You have new symptoms. You are generally not feeling well.  SEEK IMMEDIATE MEDICAL CARE IF: You have pain that radiates from your back into your legs. You develop new bowel or bladder control problems. You have unusual weakness or numbness in your arms or legs. You develop nausea or vomiting. You develop abdominal pain. You feel faint.     Reviewed expectations re: course of current medical issues. Questions answered. Outlined signs and symptoms indicating need for more acute intervention. Patient verbalized understanding. After Visit Summary given.   SUBJECTIVE: History from: patient.  Eric Schultz is a 79 y.o. male who presents with complaint of intermittent right sided lower back discomfort. Onset gradual. First noted approx one m ago after walking around his yard; 'stepped wrong I think'. History of back  problems requiring medical care: none reported. Pain described as aching and without radiation. Aggravating factors: certain movements and prolonged walking/standing. Alleviating factors: rest. Progressive LE weakness or saddle anesthesia: none. Extremity sensation changes or weakness: none. Ambulatory without difficulty. Normal bowel/bladder habits: yes; without urinary retention. Normal PO intake without n/v. No associated abdominal pain/n/v. Self treatment: has has not tried OTC therapies.  Reports no chronic steroid use, fevers, IV drug use, or recent back surgeries or procedures.  Increased blood pressure noted today.  Reports that he has not been treated for hypertension in the past.  He reports no chest pain on exertion, no dyspnea on exertion, no orthostatic dizziness or lightheadedness, no orthopnea or paroxysmal nocturnal dyspnea, no palpitations and no intermittent claudication symptoms.   OBJECTIVE:  Vitals:   09/29/19 1345  BP: (!) 153/77  Pulse: 62  Resp: (!) 24  Temp: 98.1 F (36.7 C)  TempSrc: Oral  SpO2: 98%    Recheck RR: 18  General appearance: alert; no distress but appears to be in pain when transitioning from sitting to standing HEENT: [place]; AT Neck: supple with FROM; without midline tenderness CV: RRR Lungs: unlabored respirations; symmetrical air entry Abdomen: soft, non-tender; non-distended Back: moderate and poorly localized tenderness to palpation over R lower lumbar paraspinal musculature; FROM at waist; bruising: none; without midline tenderness Extremities: with mild bilateral LE edema (baseline); symmetrical without gross deformities; normal ROM of bilateral LE Skin: warm and dry Neurologic: normal gait but walks slowly; normal sensation and strength of bilateral UE Psychological: alert and cooperative; normal mood and affect    Allergies  Allergen Reactions  . Sulfa Antibiotics Nausea And Vomiting    Past Medical History:  Diagnosis Date  . Cellulitis    left lower leg  . Gout   . HOH (hard of hearing)   . Hypertension    "borderline"  . Sleep apnea    Social History   Socioeconomic History  . Marital status: Single    Spouse name: Not on file  . Number of children: Not on file  . Years of education: Not on file  . Highest education level: Not on file  Occupational History  . Not on file  Tobacco Use  . Smoking status: Never Smoker  . Smokeless tobacco: Never Used  Substance and Sexual Activity  . Alcohol use: No  . Drug use: No  . Sexual activity: Not on file  Other Topics Concern  . Not on file  Social History Narrative   Marital  status: single      Children: none      Employment: semi-retired;       Tobacco: none      Alcohol: none         Social Determinants of Health   Financial Resource Strain:   . Difficulty of Paying Living Expenses: Not on file  Food Insecurity:   . Worried About Charity fundraiser in the Last Year: Not on file  . Ran Out of Food in the Last Year: Not on file  Transportation Needs:   . Lack of Transportation (Medical): Not on file  . Lack of Transportation (Non-Medical): Not on file  Physical Activity:   . Days of Exercise per Week: Not on file  . Minutes of Exercise per Session: Not on file  Stress:   . Feeling of Stress : Not on file  Social Connections:   . Frequency of Communication with Friends and Family: Not on file  . Frequency of  Social Gatherings with Friends and Family: Not on file  . Attends Religious Services: Not on file  . Active Member of Clubs or Organizations: Not on file  . Attends Archivist Meetings: Not on file  . Marital Status: Not on file  Intimate Partner Violence:   . Fear of Current or Ex-Partner: Not on file  . Emotionally Abused: Not on file  . Physically Abused: Not on file  . Sexually Abused: Not on file   Family History  Problem Relation Age of Onset  . Heart failure Mother   . Heart failure Father    History reviewed. No pertinent surgical history.   Vanessa Kick, MD 09/29/19 [phone]

## 2019-09-29 NOTE — ED Triage Notes (Signed)
Patient reports pain in right side of lower back .  Patient has had this pain for a month.  Patient reports pain in this area for a month after walking around his property.  Denies injury.  Pain has worsened

## 2019-09-29 NOTE — Discharge Instructions (Signed)
Your blood pressure was noted to be elevated during your visit today. You may return here within the next few days to recheck if unable to see your primary care doctor. If your blood pressure remains persistently elevated, you may need to begin taking a medication.  BP (!) 153/77 (BP Location: Right Arm) Comment (BP Location): large cuff   Pulse 62    Temp 98.1 F (36.7 C) (Oral)    Resp (!) 24    SpO2 98%   HOME CARE INSTRUCTIONS: For many people, back pain returns. Since low back pain is rarely dangerous, it is often a condition that people can learn to manage on their own. Please remain active. It is stressful on the back to sit or stand in one place. Do not sit, drive, or stand in one place for more than 30 minutes at a time. Take short walks on level surfaces as soon as pain allows. Try to increase the length of time you walk each day. Do not stay in bed. Resting more than 1 or 2 days can delay your recovery. Do not avoid exercise or work. Your body is made to move. It is not dangerous to be active, even though your back may hurt. Your back will likely heal faster if you return to being active before your pain is gone. Over-the-counter medicines to reduce pain and inflammation are often the most helpful.  SEEK MEDICAL CARE IF: You have pain that is not relieved with rest or medicine. You have pain that does not improve in 1 week. You have new symptoms. You are generally not feeling well.  SEEK IMMEDIATE MEDICAL CARE IF: You have pain that radiates from your back into your legs. You develop new bowel or bladder control problems. You have unusual weakness or numbness in your arms or legs. You develop nausea or vomiting. You develop abdominal pain. You feel faint.

## 2019-10-15 ENCOUNTER — Encounter (HOSPITAL_COMMUNITY): Payer: Self-pay | Admitting: Emergency Medicine

## 2019-10-15 ENCOUNTER — Ambulatory Visit (HOSPITAL_COMMUNITY)
Admission: EM | Admit: 2019-10-15 | Discharge: 2019-10-15 | Disposition: A | Discharge status: Home / Self Care | Payer: Medicare PPO | Attending: Family Medicine | Admitting: Family Medicine

## 2019-10-15 ENCOUNTER — Emergency Department (HOSPITAL_COMMUNITY): Payer: No Typology Code available for payment source

## 2019-10-15 ENCOUNTER — Other Ambulatory Visit: Payer: Self-pay

## 2019-10-15 ENCOUNTER — Encounter (HOSPITAL_COMMUNITY): Payer: Self-pay

## 2019-10-15 ENCOUNTER — Emergency Department (HOSPITAL_COMMUNITY)
Admission: EM | Admit: 2019-10-15 | Discharge: 2019-10-15 | Disposition: A | Discharge status: Home / Self Care | Payer: No Typology Code available for payment source | Attending: Emergency Medicine | Admitting: Emergency Medicine

## 2019-10-15 DIAGNOSIS — I10 Essential (primary) hypertension: Secondary | ICD-10-CM | POA: Insufficient documentation

## 2019-10-15 DIAGNOSIS — R0789 Other chest pain: Secondary | ICD-10-CM | POA: Diagnosis present

## 2019-10-15 DIAGNOSIS — K921 Melena: Secondary | ICD-10-CM | POA: Insufficient documentation

## 2019-10-15 DIAGNOSIS — R079 Chest pain, unspecified: Secondary | ICD-10-CM

## 2019-10-15 LAB — CBC
HCT: 39.8 % (ref 39.0–52.0)
Hemoglobin: 13.1 g/dL (ref 13.0–17.0)
MCH: 33.6 pg (ref 26.0–34.0)
MCHC: 32.9 g/dL (ref 30.0–36.0)
MCV: 102.1 fL — ABNORMAL HIGH (ref 80.0–100.0)
Platelets: 85 10*3/uL — ABNORMAL LOW (ref 150–400)
RBC: 3.9 MIL/uL — ABNORMAL LOW (ref 4.22–5.81)
RDW: 13.9 % (ref 11.5–15.5)
WBC: 3.6 10*3/uL — ABNORMAL LOW (ref 4.0–10.5)
nRBC: 0 % (ref 0.0–0.2)

## 2019-10-15 LAB — BASIC METABOLIC PANEL
Anion gap: 10 (ref 5–15)
BUN: 14 mg/dL (ref 8–23)
CO2: 23 mmol/L (ref 22–32)
Calcium: 8.8 mg/dL — ABNORMAL LOW (ref 8.9–10.3)
Chloride: 108 mmol/L (ref 98–111)
Creatinine, Ser: 0.91 mg/dL (ref 0.61–1.24)
GFR calc Af Amer: >60 mL/min (ref >60)
GFR calc non Af Amer: >60 mL/min (ref >60)
Glucose, Bld: 92 mg/dL (ref 70–99)
Potassium: 3.7 mmol/L (ref 3.5–5.1)
Sodium: 141 mmol/L (ref 135–145)

## 2019-10-15 LAB — TROPONIN I (HIGH SENSITIVITY)
Troponin I (High Sensitivity): 12 ng/L (ref <18)
Troponin I (High Sensitivity): 14 ng/L (ref <18)

## 2019-10-15 MED ORDER — SODIUM CHLORIDE 0.9% FLUSH: 3.0000 mL | Freq: Once | INTRAVENOUS | Status: DC

## 2019-10-15 NOTE — ED Notes (Signed)
Patient refused to change into gown. 

## 2019-10-15 NOTE — ED Triage Notes (Signed)
Pt reports two episodes of a "pulse" moving through his chest that "was very uncomfortable."  Pt denies any diaphoresis, dizziness or N/V.  Also reports bright red blood in stool.

## 2019-10-15 NOTE — ED Notes (Signed)
Patient is being discharged from the Urgent [facility] and sent to the Emergency Department via wheelchair by staff. Per Provider Augusto Gamble, patient is stable but in need of higher level of care due to New Chest Pain. Patient is aware and verbalizes understanding of plan of care.   Vitals:   10/15/19 1820  BP: (!) 145/118  Pulse: 68  Resp: 16  Temp: 98 F (36.7 C)  SpO2: 98%

## 2019-10-15 NOTE — ED Provider Notes (Signed)
[facility] EMERGENCY DEPARTMENT Provider Note   CSN: [id number] Arrival date & time: 10/15/19  1859     History Chief Complaint  Patient presents with  . Chest Pain  . Blood In Stools    Eric Schultz is a 79 y.o. male.  Patient with past medical history notable for hypertension presents to the emergency department with a chief complaint of 2 episodes of chest pain.  He states that he had 2 brief episodes of chest pain today.  One was at 2 PM, another at 5 PM.  He states that the episodes lasted about a second.  He denies any associated shortness of breath, radiating pain, nausea, or diaphoresis.  Denies any history of heart attack.  Denies any pain now.  Patient also states that he has had some rectal bleeding for the past 6 months to a year.  He states that he has bright red blood when he wipes.  He has not been filling the toilet bowl with blood.  He denies any other associated symptoms.  Cannot recall when his last colonoscopy was.  The history is provided by the patient. No language interpreter was used.       Past Medical History:  Diagnosis Date  . Cellulitis    left lower leg  . Gout   . HOH (hard of hearing)   . Hypertension    "borderline"  . Sleep apnea     Patient Active Problem List   Diagnosis Date Noted  . Community acquired pneumonia 03/21/2019  . Cellulitis of foot, left 03/21/2019  . Chronic venous stasis dermatitis of both lower extremities 03/21/2019  . AKI (acute kidney injury) ([place]) 03/21/2019  . Acute on chronic respiratory failure with hypoxia ([place]) 03/21/2019  . Hypokalemia 02/09/2014    History reviewed. No pertinent surgical history.     Family History  Problem Relation Age of Onset  . Heart failure Mother   . Heart failure Father     Social History   Tobacco Use  . Smoking status: Never Smoker  . Smokeless tobacco: Never Used  Substance Use Topics  . Alcohol use: No  . Drug use: No    Home  Medications Prior to Admission medications   Medication Sig Start Date End Date Taking? Authorizing Provider  meloxicam (MOBIC) 15 MG tablet Take 1 tablet (15 mg total) by mouth daily. 09/29/19   Vanessa Kick, MD  predniSONE (STERAPRED UNI-PAK 21 TAB) 10 MG (21) TBPK tablet Take by mouth daily. Take as directed. 09/29/19   Vanessa Kick, MD  furosemide (LASIX) 20 MG tablet Take 1 tablet (20 mg total) by mouth daily. (please follow up with your PCP to check labs and weight within 1 week) 03/25/19 09/29/19  Elodia Florence., MD  potassium chloride SA (K-DUR) 20 MEQ tablet Take 1 tablet (20 mEq total) by mouth daily for 14 days. (follow up with your PCP for labs within 1 week) 03/25/19 09/29/19  Elodia Florence., MD    Allergies    Sulfa antibiotics  Review of Systems   Review of Systems  All other systems reviewed and are negative.   Physical Exam Updated Vital Signs BP (!) 148/73   Pulse 67   Temp 98.7 F (37.1 C) (Oral)   Resp 10   Ht 5\' 11"  (1.803 m)   Wt 127 kg   SpO2 100%   BMI 39.05 kg/m   Physical Exam Vitals and nursing note reviewed.  Constitutional:  Appearance: He is well-developed.  HENT:     Head: Normocephalic and atraumatic.  Eyes:     Conjunctiva/sclera: Conjunctivae normal.  Cardiovascular:     Rate and Rhythm: Normal rate and regular rhythm.     Heart sounds: No murmur.  Pulmonary:     Effort: Pulmonary effort is normal. No respiratory distress.     Breath sounds: Normal breath sounds.  Abdominal:     Palpations: Abdomen is soft.     Tenderness: There is no abdominal tenderness.  Musculoskeletal:     Cervical back: Neck supple.  Skin:    General: Skin is warm and dry.  Neurological:     Mental Status: He is alert and oriented to person, place, and time.  Psychiatric:        Mood and Affect: Mood normal.        Behavior: Behavior normal.     ED Results / Procedures / Treatments   Labs (all labs ordered are listed, but only abnormal  results are displayed) Labs Reviewed  BASIC METABOLIC PANEL - Abnormal; Notable for the following components:      Result Value   Calcium 8.8 (*)    All other components within normal limits  CBC - Abnormal; Notable for the following components:   WBC 3.6 (*)    RBC 3.90 (*)    MCV 102.1 (*)    Platelets 85 (*)    All other components within normal limits  TROPONIN I (HIGH SENSITIVITY)  TROPONIN I (HIGH SENSITIVITY)    EKG EKG Interpretation  Date/Time:  Saturday October 15 2019 19:05:07 EST Ventricular Rate:  62 PR Interval:  340 QRS Duration: 150 QT Interval:  488 QTC Calculation: 495 R Axis:   -78 Text Interpretation: Sinus rhythm with 1st degree A-V block with Premature atrial complexes Left axis deviation Right bundle branch block Anterior infarct , age undetermined Abnormal ECG No significant change was found Confirmed by Gerlene Fee [phone]) on 10/15/2019 10:53:24 PM   Radiology DG Chest 2 View  Result Date: 10/15/2019 CLINICAL DATA:  Chest pain. EXAM: CHEST - 2 VIEW COMPARISON:  March 25, 2019 FINDINGS: The heart size remains enlarged. Aortic calcifications are noted. There is no pneumothorax. No large pleural effusion. There are few linear airspace opacities in the left mid lung zone favored to represent atelectasis or scarring. IMPRESSION: No active cardiopulmonary disease. Electronically Signed   By: Constance Holster M.D.   On: 10/15/2019 19:53    Procedures Procedures (including critical care time)  Medications Ordered in ED Medications  sodium chloride flush (NS) 0.9 % injection 3 mL (has no administration in time range)    ED Course  I have reviewed the triage vital signs and the nursing notes.  Pertinent labs & imaging results that were available during my care of the patient were reviewed by me and considered in my medical decision making (see chart for details).    MDM Rules/Calculators/A&P                      Patient with chest pain that lasted  for about a second 2 separate times today.  No radiating pain, no pressure, no shortness of breath, no diaphoresis.  Troponin is 12, repeat is 14.  No acute ischemic EKG changes.  Chest x-ray is negative.  Doubt ACS or other emergent cause for chest pain given the very brief episodes, and no pain now.  Regarding the patient's rectal bleeding that has been ongoing for  several months to a year.  Have advised that he follow-up with his doctor or with GI.  He probably would benefit from a colonoscopy given that he cannot remember when his last one was.  His hemoglobin is 13.1.  His blood pressure has been stable in the [id number] systolic.  He is in no acute distress.  He is requesting to be discharged.  Patient seen by discussed with Dr. Sedonia Small, who agrees with the plan.  Final Clinical Impression(s) / ED Diagnoses Final diagnoses:  Chest pain, unspecified type  Blood in stool    Rx / DC Orders ED Discharge Orders    None       Montine Circle, PA-C 10/15/19 2309    Maudie Flakes, MD 10/16/19 [phone]

## 2019-10-15 NOTE — Discharge Instructions (Signed)
You should follow-up with your doctor regarding your rectal bleeding.  Your blood counts here were stable.  You should discuss having a colonoscopy with your doctor.  Regarding your chest pain.  We checked 2 sets of heart markets, neither of which were significantly elevated.  We saw no evidence of heart attack.  You are cleared for outpatient follow-up.

## 2019-10-15 NOTE — ED Notes (Signed)
Called from waiting area without any response.

## 2019-10-15 NOTE — ED Triage Notes (Signed)
Pt presents with generalized chest pain that started today from unknown source.

## 2019-10-31 ENCOUNTER — Encounter (HOSPITAL_COMMUNITY): Payer: Self-pay | Admitting: Emergency Medicine

## 2019-10-31 ENCOUNTER — Ambulatory Visit (HOSPITAL_COMMUNITY)
Admission: EM | Admit: 2019-10-31 | Discharge: 2019-10-31 | Disposition: A | Discharge status: Home / Self Care | Payer: Medicare PPO | Attending: Family Medicine | Admitting: Family Medicine

## 2019-10-31 ENCOUNTER — Other Ambulatory Visit: Payer: Self-pay

## 2019-10-31 DIAGNOSIS — K5901 Slow transit constipation: Secondary | ICD-10-CM

## 2019-10-31 DIAGNOSIS — K625 Hemorrhage of anus and rectum: Secondary | ICD-10-CM

## 2019-10-31 MED ORDER — SENNOSIDES-DOCUSATE SODIUM 8.6-50 MG PO TABS: 1.0000 | ORAL_TABLET | Freq: Every day | ORAL | 1 refills | Status: DC

## 2019-10-31 NOTE — ED Provider Notes (Signed)
[facility]    CSN: [id number] Arrival date & time: 10/31/19  1242      History   Chief Complaint Chief Complaint  Patient presents with  . Rectal Bleeding    HPI Eric Schultz is a 79 y.o. male.   HPI  This is a 79 year old gentleman who is here with a number of complaints. As I reviewed his medical record, I see these complaints and previous visits as well. He obtains most of his regular medical care through the [facility] hospital in [place].  He states he goes there 2 or 3 times a year.  He comes here if he has other health concerns. He complains of venous stasis disease of his legs with bilateral leg swelling.  Dry cracked skin.  History of cellulitis.  He decided to quit taking his Lasix, states he does not like taking medication.  His leg edema is basically untreated at this time, chronic," painful He also states that he has chronic indigestion.  He takes Tums or antacids as needed.  He denies need for any acid reducing medication or prescription pills.  He is not willing to change his diet.  He does not drink alcohol.  He does not take very many NSAID medicines. He has chronic constipation.  He eats out most the time.  He drinks mostly "soda pop".  Does not drink water.  Did not exercise.  Is not on a high-fiber diet.  He states that he has noticed some blood on the toilet tissue when he wipes for the last month or so.  He states that when he has bowel movements he thinks there are "black specks" in his bowels.  This complaint has been mentioned before.  He is uncertain when he had his last colonoscopy but states he has had 1 or 2.  He is not seeing any body regularly for his gastrointestinal complaints.  He has no rectal pain.  He has no known rectal hemorrhoids, fissure, or diagnosis.  He was given MiraLAX at a prior visit.  He states this does not work.  He cannot tell me whether he took it daily, or for how long he took it.  He would like a different medicine.  Past  Medical History:  Diagnosis Date  . Cellulitis    left lower leg  . Gout   . HOH (hard of hearing)   . Hypertension    "borderline"  . Sleep apnea     Patient Active Problem List   Diagnosis Date Noted  . Cellulitis of foot, left 03/21/2019  . Chronic venous stasis dermatitis of both lower extremities 03/21/2019  . AKI (acute kidney injury) ([place]) 03/21/2019  . Acute on chronic respiratory failure with hypoxia ([place]) 03/21/2019    History reviewed. No pertinent surgical history.     Home Medications    Prior to Admission medications   Medication Sig Start Date End Date Taking? Authorizing Provider  senna-docusate (SENOKOT-S) 8.6-50 MG tablet Take 1 tablet by mouth daily. IF NEEDED 10/31/19   Raylene Everts, MD  furosemide (LASIX) 20 MG tablet Take 1 tablet (20 mg total) by mouth daily. (please follow up with your PCP to check labs and weight within 1 week) 03/25/19 09/29/19  Elodia Florence., MD  potassium chloride SA (K-DUR) 20 MEQ tablet Take 1 tablet (20 mEq total) by mouth daily for 14 days. (follow up with your PCP for labs within 1 week) 03/25/19 09/29/19  Elodia Florence., MD  Family History Family History  Problem Relation Age of Onset  . Heart failure Mother   . Heart failure Father     Social History Social History   Tobacco Use  . Smoking status: Never Smoker  . Smokeless tobacco: Never Used  Substance Use Topics  . Alcohol use: No  . Drug use: No     Allergies   Sulfa antibiotics   Review of Systems Review of Systems  Constitutional: Negative for activity change, appetite change and fever.  Cardiovascular: Positive for leg swelling. Negative for chest pain.  Gastrointestinal: Positive for blood in stool and constipation. Negative for abdominal distention, abdominal pain and rectal pain.     Physical Exam Triage Vital Signs ED Triage Vitals  Enc Vitals Group     BP 10/31/19 1337 (!) 159/75     Pulse Rate 10/31/19 1337 61      Resp 10/31/19 1337 18     Temp 10/31/19 1337 (!) 97.5 F (36.4 C)     Temp Source 10/31/19 1337 Oral     SpO2 10/31/19 1337 97 %     Weight --      Height --      Head Circumference --      Peak Flow --      Pain Score 10/31/19 1338 7     Pain Loc --      Pain Edu? --      Excl. in [place]? --    No data found.  Updated Vital Signs BP (!) 159/75 (BP Location: Right Arm)   Pulse 61   Temp (!) 97.5 F (36.4 C) (Oral)   Resp 18   SpO2 97%      Physical Exam Constitutional:      General: He is not in acute distress.    Appearance: He is well-developed. He is obese.  HENT:     Head: Normocephalic and atraumatic.     Mouth/Throat:     Comments: Mask in place Eyes:     Conjunctiva/sclera: Conjunctivae normal.     Pupils: Pupils are equal, round, and reactive to light.  Cardiovascular:     Rate and Rhythm: Normal rate.  Pulmonary:     Effort: Pulmonary effort is normal. No respiratory distress.  Abdominal:     Comments: Rounded abdomen  Musculoskeletal:        General: Normal range of motion.     Cervical back: Normal range of motion.  Skin:    General: Skin is warm and dry.  Neurological:     Mental Status: He is alert.  Psychiatric:        Mood and Affect: Mood normal.        Behavior: Behavior normal.      UC Treatments / Results  Labs (all labs ordered are listed, but only abnormal results are displayed) Labs Reviewed - No data to display  EKG   Radiology No results found.  Procedures Procedures (including critical care time)  Medications Ordered in UC Medications - No data to display  Initial Impression / Assessment and Plan / UC Course  I have reviewed the triage vital signs and the nursing notes.  Pertinent labs & imaging results that were available during my care of the patient were reviewed by me and considered in my medical decision making (see chart for details).     We discussed chronic constipation.  The need to drink water.  Walk daily.   Eat a high-fiber diet.  Take fiber supplementation.  Use laxatives as infrequently as possible. I recommend he follow-up with a GI doctor and have given them the number.  He needs to follow-up especially if he continues to have blood in his bowels. Other follow-up can be through the [facility] hospital. Offered refill of Lasix which patient declines Final Clinical Impressions(s) / UC Diagnoses   Final diagnoses:  Rectal bleeding  Slow transit constipation     Discharge Instructions     Try to drink more water Walk every day that you are able Eat more fiber If needed take Senna, only 2 x a week See your primary care regularly See gastroenterology doctor if bleeding persists    ED Prescriptions    Medication Sig Dispense Auth. Provider   senna-docusate (SENOKOT-S) 8.6-50 MG tablet Take 1 tablet by mouth daily. IF NEEDED 30 tablet Raylene Everts, MD     I have reviewed the PDMP during this encounter.   Raylene Everts, MD 10/31/19 Carollee Massed

## 2019-10-31 NOTE — ED Triage Notes (Signed)
Pt here and sts noticing blood when wiping after BM for 1 month

## 2019-10-31 NOTE — Discharge Instructions (Addendum)
Try to drink more water Walk every day that you are able Eat more fiber If needed take Senna, only 2 x a week See your primary care regularly See gastroenterology doctor if bleeding persists

## 2019-11-13 ENCOUNTER — Encounter (HOSPITAL_COMMUNITY): Payer: Self-pay | Admitting: Emergency Medicine

## 2019-11-13 ENCOUNTER — Other Ambulatory Visit: Payer: Self-pay

## 2019-11-13 ENCOUNTER — Ambulatory Visit (HOSPITAL_COMMUNITY)
Admission: RE | Admit: 2019-11-13 | Discharge: 2019-11-13 | Disposition: A | Discharge status: Home / Self Care | Payer: Medicare PPO | Source: Ambulatory Visit | Attending: Emergency Medicine | Admitting: Emergency Medicine

## 2019-11-13 ENCOUNTER — Ambulatory Visit (HOSPITAL_COMMUNITY)
Admission: EM | Admit: 2019-11-13 | Discharge: 2019-11-13 | Disposition: A | Discharge status: Home / Self Care | Payer: Medicare PPO | Attending: Emergency Medicine | Admitting: Emergency Medicine

## 2019-11-13 DIAGNOSIS — I872 Venous insufficiency (chronic) (peripheral): Secondary | ICD-10-CM

## 2019-11-13 DIAGNOSIS — R600 Localized edema: Secondary | ICD-10-CM

## 2019-11-13 DIAGNOSIS — M79609 Pain in unspecified limb: Secondary | ICD-10-CM | POA: Diagnosis not present

## 2019-11-13 DIAGNOSIS — M79662 Pain in left lower leg: Secondary | ICD-10-CM | POA: Diagnosis not present

## 2019-11-13 DIAGNOSIS — M79652 Pain in left thigh: Secondary | ICD-10-CM

## 2019-11-13 DIAGNOSIS — R590 Localized enlarged lymph nodes: Secondary | ICD-10-CM | POA: Insufficient documentation

## 2019-11-13 DIAGNOSIS — M79605 Pain in left leg: Secondary | ICD-10-CM | POA: Insufficient documentation

## 2019-11-13 DIAGNOSIS — M7989 Other specified soft tissue disorders: Secondary | ICD-10-CM | POA: Diagnosis not present

## 2019-11-13 MED ORDER — DOXYCYCLINE HYCLATE 100 MG PO CAPS: 100.0000 mg | ORAL_CAPSULE | Freq: Two times a day (BID) | ORAL | 0 refills | Status: DC

## 2019-11-13 NOTE — ED Provider Notes (Signed)
[place]    CSN: [id number] Arrival date & time: 11/13/19  1358      History   Chief Complaint Chief Complaint  Patient presents with  . Leg Pain    HPI Eric Schultz is a 79 y.o. male.   Eric Schultz presents with complaints of acute on chronic left leg pain. Pain to left calf and left thigh. Chronic lower extremity edema, noted multiple times in previous visits, related to venous stasis.Marland Kitchen History of cellulitis to left lower extremity as well. No known history of blood clots. No cough or shortness of breath . No fevers. Doesn't follow regularly with a PCP, he states historically he sees the [place]. In the past has declined use of lasix, and does not use compression. No new numbness or tingling to extremities. No open wounds. No injury to the leg.    ROS per HPI, negative if not otherwise mentioned.      Past Medical History:  Diagnosis Date  . Cellulitis    left lower leg  . Gout   . HOH (hard of hearing)   . Hypertension    "borderline"  . Sleep apnea     Patient Active Problem List   Diagnosis Date Noted  . Cellulitis of foot, left 03/21/2019  . Chronic venous stasis dermatitis of both lower extremities 03/21/2019  . AKI (acute kidney injury) ([place]) 03/21/2019  . Acute on chronic respiratory failure with hypoxia ([place]) 03/21/2019    History reviewed. No pertinent surgical history.     Home Medications    Prior to Admission medications   Medication Sig Start Date End Date Taking? Authorizing Provider  senna-docusate (SENOKOT-S) 8.6-50 MG tablet Take 1 tablet by mouth daily. IF NEEDED 10/31/19  Yes Raylene Everts, MD  doxycycline (VIBRAMYCIN) 100 MG capsule Take 1 capsule (100 mg total) by mouth 2 (two) times daily. 11/13/19   Zigmund Gottron, NP  furosemide (LASIX) 20 MG tablet Take 1 tablet (20 mg total) by mouth daily. (please follow up with your PCP to check labs and weight within 1 week) 03/25/19 09/29/19  Elodia Florence., MD   potassium chloride SA (K-DUR) 20 MEQ tablet Take 1 tablet (20 mEq total) by mouth daily for 14 days. (follow up with your PCP for labs within 1 week) 03/25/19 09/29/19  Elodia Florence., MD    Family History Family History  Problem Relation Age of Onset  . Heart failure Mother   . Heart failure Father     Social History Social History   Tobacco Use  . Smoking status: Never Smoker  . Smokeless tobacco: Never Used  Substance Use Topics  . Alcohol use: No  . Drug use: No     Allergies   Sulfa antibiotics   Review of Systems Review of Systems   Physical Exam Triage Vital Signs ED Triage Vitals  Enc Vitals Group     BP 11/13/19 1426 (!) 166/98     Pulse Rate 11/13/19 1426 94     Resp 11/13/19 1426 16     Temp 11/13/19 1426 98.4 F (36.9 C)     Temp Source 11/13/19 1426 Oral     SpO2 11/13/19 1426 94 %     Weight --      Height --      Head Circumference --      Peak Flow --      Pain Score 11/13/19 1425 3     Pain Loc --  Pain Edu? --      Excl. in [place]? --    No data found.  Updated Vital Signs BP (!) 166/98   Pulse 94   Temp 98.4 F (36.9 C) (Oral)   Resp 16   SpO2 94%   Visual Acuity Right Eye Distance:   Left Eye Distance:   Bilateral Distance:    Right Eye Near:   Left Eye Near:    Bilateral Near:     Physical Exam Constitutional:      Appearance: He is well-developed.  Cardiovascular:     Rate and Rhythm: Normal rate.     Comments: Tight edema with hyperpigmented skin bilaterally and scaly skin; left leg is greater than right; redness noted to proximal calf to posterior knee and distal thigh with tenderness; see photos  Pulmonary:     Effort: Pulmonary effort is normal.  Musculoskeletal:     Right lower leg: 3+ Edema present.     Left lower leg: 4+ Edema present.  Skin:    General: Skin is warm and dry.  Neurological:     Mental Status: He is alert and oriented to person, place, and time.          UC Treatments /  Results  Labs (all labs ordered are listed, but only abnormal results are displayed) Labs Reviewed - No data to display  EKG   Radiology No results found.  Procedures Procedures (including critical care time)  Medications Ordered in UC Medications - No data to display  Initial Impression / Assessment and Plan / UC Course  I have reviewed the triage vital signs and the nursing notes.  Pertinent labs & imaging results that were available during my care of the patient were reviewed by me and considered in my medical decision making (see chart for details).     Acute worsening of edema vs cellulitis vs dvt discussed and considered. Antibiotics provided and ultrasound ordered for dvt study of left lower leg. Emphasized follow up with a PCP for long term management of edema. Encouraged elevation, compression, sodium monitoring to help with edema. Return precautions provided. Patient verbalized understanding and agreeable to plan.   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  Please reach out to the [place] to establish and follow with a primary care provider.  We will start antibiotics for concern for infection to your left leg.  We will set up for an ultrasound of your left left to ensure no blood clots are present.    ED Prescriptions    Medication Sig Dispense Auth. Provider   doxycycline (VIBRAMYCIN) 100 MG capsule Take 1 capsule (100 mg total) by mouth 2 (two) times daily. 20 capsule Zigmund Gottron, NP     PDMP not reviewed this encounter.   Zigmund Gottron, NP 11/13/19 1451

## 2019-11-13 NOTE — Progress Notes (Signed)
VASCULAR LAB PRELIMINARY  PRELIMINARY  PRELIMINARY  PRELIMINARY  Left lower extremity venous duplex completed.    Preliminary report:  See CV proc for preliminary results.  Called report to [place], RN  [facility], RVT 11/13/2019, 3:32 PM

## 2019-11-13 NOTE — ED Triage Notes (Signed)
Left leg pain for 1 week. No injury. History of venous stasis dermatitis on same leg

## 2019-11-13 NOTE — Discharge Instructions (Signed)
Please use compression stockings and elevate your legs to help with swelling.  Please reach out to the [place] to establish and follow with a primary care provider.  We will start antibiotics for concern for infection to your left leg.  We will set up for an ultrasound of your left left to ensure no blood clots are present.

## 2019-12-06 ENCOUNTER — Other Ambulatory Visit: Payer: Self-pay

## 2019-12-06 ENCOUNTER — Encounter (HOSPITAL_COMMUNITY): Payer: Self-pay

## 2019-12-06 ENCOUNTER — Ambulatory Visit (HOSPITAL_COMMUNITY)
Admission: EM | Admit: 2019-12-06 | Discharge: 2019-12-06 | Disposition: A | Discharge status: Home / Self Care | Payer: Medicare PPO | Attending: Family Medicine | Admitting: Family Medicine

## 2019-12-06 DIAGNOSIS — K625 Hemorrhage of anus and rectum: Secondary | ICD-10-CM

## 2019-12-06 DIAGNOSIS — K5900 Constipation, unspecified: Secondary | ICD-10-CM

## 2019-12-06 NOTE — Discharge Instructions (Signed)
Call the gastro office tomorrow.  You will need to be seen in consultation first then they will schedule your tests

## 2019-12-06 NOTE — ED Triage Notes (Signed)
Pt c/o bright red rectal bleeding for past several months and states he thinks "he is stopped up". Had bowel movement with stool and blood this morning. States he had chills all day yesterday.  Denies abdom pain, n/v. Denies burning with urination, frequency.  States has been able to eat/drink WNL for pt.

## 2019-12-06 NOTE — ED Provider Notes (Signed)
[place]    CSN: [id number] Arrival date & time: 12/06/19  1556      History   Chief Complaint Chief Complaint  Patient presents with  . Rectal Bleeding    HPI Eric Schultz is a 79 y.o. male.   HPI  I saw this patient in March 2021 for constipation and rectal bleeding.  We had a discussion about management of constipation.  We had discussion about persistent rectal bleeding and the need for GI follow-up of this happened.  Patient is here today stating that he still has constipation.  He still has rectal bleeding.  He has decided that he will see a gastroenterologist.  The reason he is here today he states that he had chills all day yesterday.  No nausea or vomiting.  Normal appetite.  No coughing or shortness of breath. He refuses Covid vaccination, and all vaccination Patient states that he is suspicious with medical care in general He states his weight has been stable He has never had a colonoscopy There is no colon cancer in his family He only has small amount of bleeding on the toilet tissue, no concern for anemia or significant blood loss  Past Medical History:  Diagnosis Date  . Cellulitis    left lower leg  . Gout   . HOH (hard of hearing)   . Hypertension    "borderline"  . Sleep apnea     Patient Active Problem List   Diagnosis Date Noted  . Cellulitis of foot, left 03/21/2019  . Chronic venous stasis dermatitis of both lower extremities 03/21/2019  . AKI (acute kidney injury) ([place]) 03/21/2019  . Acute on chronic respiratory failure with hypoxia ([place]) 03/21/2019    History reviewed. No pertinent surgical history.     Home Medications    Prior to Admission medications   Medication Sig Start Date End Date Taking? Authorizing Provider  Multiple Vitamin (MULTIVITAMIN) tablet Take 1 tablet by mouth daily.   Yes [provider]  furosemide (LASIX) 20 MG tablet Take 1 tablet (20 mg total) by mouth daily. (please follow up with your  PCP to check labs and weight within 1 week) 03/25/19 09/29/19  Elodia Florence., MD  potassium chloride SA (K-DUR) 20 MEQ tablet Take 1 tablet (20 mEq total) by mouth daily for 14 days. (follow up with your PCP for labs within 1 week) 03/25/19 09/29/19  Elodia Florence., MD    Family History Family History  Problem Relation Age of Onset  . Heart failure Mother   . Heart failure Father     Social History Social History   Tobacco Use  . Smoking status: Never Smoker  . Smokeless tobacco: Never Used  Substance Use Topics  . Alcohol use: No  . Drug use: No     Allergies   Sulfa antibiotics   Review of Systems Review of Systems  Constitutional: Positive for chills. Negative for appetite change, fatigue, fever and unexpected weight change.  HENT: Negative for congestion.   Respiratory: Negative for cough and shortness of breath.   Gastrointestinal: Positive for abdominal distention, blood in stool and constipation.     Physical Exam Triage Vital Signs ED Triage Vitals  Enc Vitals Group     BP 12/06/19 1627 (!) 144/74     Pulse Rate 12/06/19 1627 (!) 55     Resp 12/06/19 1627 18     Temp 12/06/19 1627 97.9 F (36.6 C)     Temp Source  12/06/19 1627 Oral     SpO2 12/06/19 1627 98 %     Weight --      Height --      Head Circumference --      Peak Flow --      Pain Score 12/06/19 1622 0     Pain Loc --      Pain Edu? --      Excl. in [place]? --    No data found.  Updated Vital Signs BP (!) 144/74 (BP Location: Right Arm)   Pulse (!) 55   Temp 97.9 F (36.6 C) (Oral)   Resp 18   SpO2 98%      Physical Exam Constitutional:      General: He is not in acute distress.    Appearance: He is well-developed. He is obese.  HENT:     Head: Normocephalic and atraumatic.     Mouth/Throat:     Comments: Mask is in place Eyes:     Conjunctiva/sclera: Conjunctivae normal.     Pupils: Pupils are equal, round, and reactive to light.  Cardiovascular:     Rate and  Rhythm: Normal rate.     Heart sounds: Normal heart sounds.     Comments: Heart regular no murmur Pulmonary:     Effort: Pulmonary effort is normal. No respiratory distress.     Breath sounds: Normal breath sounds.     Comments: Lungs are clear Abdominal:     General: There is no distension.     Palpations: Abdomen is soft.     Comments: Abdomen is protuberant  Musculoskeletal:        General: Normal range of motion.     Cervical back: Normal range of motion.  Skin:    General: Skin is warm and dry.  Neurological:     Mental Status: He is alert.  Psychiatric:        Mood and Affect: Mood normal.        Behavior: Behavior normal.      UC Treatments / Results  Labs (all labs ordered are listed, but only abnormal results are displayed) Labs Reviewed - No data to display  EKG   Radiology No results found.  Procedures Procedures (including critical care time)  Medications Ordered in UC Medications - No data to display  Initial Impression / Assessment and Plan / UC Course  I have reviewed the triage vital signs and the nursing notes.  Pertinent labs & imaging results that were available during my care of the patient were reviewed by me and considered in my medical decision making (see chart for details).     See HPI Final Clinical Impressions(s) / UC Diagnoses   Final diagnoses:  Rectal bleeding  Constipation, unspecified constipation type     Discharge Instructions     Call the gastro office tomorrow.  You will need to be seen in consultation first then they will schedule your tests   ED Prescriptions    None     PDMP not reviewed this encounter.   Raylene Everts, MD 12/06/19 [phone]

## 2019-12-08 ENCOUNTER — Encounter: Payer: Self-pay | Admitting: Gastroenterology

## 2020-01-13 ENCOUNTER — Ambulatory Visit: Payer: Medicare PPO | Admitting: Physician Assistant

## 2020-01-16 ENCOUNTER — Encounter: Payer: Medicare PPO | Admitting: Gastroenterology

## 2020-01-18 ENCOUNTER — Encounter: Payer: Self-pay | Admitting: Physician Assistant

## 2020-02-10 ENCOUNTER — Telehealth: Payer: Self-pay

## 2020-02-10 NOTE — Telephone Encounter (Signed)
Requested any previous colonoscopies or path reports from the [place].

## 2020-02-13 ENCOUNTER — Encounter: Payer: Self-pay | Admitting: Physician Assistant

## 2020-02-13 ENCOUNTER — Ambulatory Visit (INDEPENDENT_AMBULATORY_CARE_PROVIDER_SITE_OTHER): Payer: Medicare PPO | Admitting: Physician Assistant

## 2020-02-13 VITALS — BP 140/90 | HR 74 | Ht 69.0 in | Wt 311.0 lb

## 2020-02-13 DIAGNOSIS — K625 Hemorrhage of anus and rectum: Secondary | ICD-10-CM

## 2020-02-13 DIAGNOSIS — K5900 Constipation, unspecified: Secondary | ICD-10-CM

## 2020-02-13 DIAGNOSIS — Z01818 Encounter for other preprocedural examination: Secondary | ICD-10-CM

## 2020-02-13 MED ORDER — NA SULFATE-K SULFATE-MG SULF 17.5-3.13-1.6 GM/177ML PO SOLN: 1.0000 | Freq: Once | ORAL | 0 refills | Status: AC

## 2020-02-13 NOTE — Progress Notes (Signed)
Chief Complaint: Rectal bleeding  HPI:    Eric Schultz is a 79 year old Caucasian male with a past medical history as listed below including cardiomegaly (03/21/2019 echo with an LVEF 60-65%), who presents clinic today with a complaint of rectal bleeding.    10/15/2019 CBC with a normal hemoglobin.    10/31/2019 patient seen in the ED with rectal bleeding and constipation.  Also describes some chronic indigestion at the time.  He was told to increase fiber, walk daily and drink water.    12/06/2019 patient is seen again for rectal bleeding and constipation.  He was told to follow-up with [place].    Today, the patient presents clinic and tells me that for the past 6 months off-and-on he has been seeing some bright red blood when he wipes on the toilet paper as well as in with his stool.  Initially this is thought due to constipation by the urgent care, but now he is drinking prune juice on a daily basis and having normal soft stools and still seeing bright red blood in his stools.  Denies abdominal pain.    Denies fever, chills, weight loss, abdominal pain or symptoms that awaken him from sleep.  Past Medical History:  Diagnosis Date  . Cellulitis    left lower leg  . Gout   . HOH (hard of hearing)   . Hypertension    "borderline"  . Sleep apnea     No past surgical history on file.  Current Outpatient Medications  Medication Sig Dispense Refill  . Multiple Vitamin (MULTIVITAMIN) tablet Take 1 tablet by mouth daily.     No current facility-administered medications for this visit.    Allergies as of 02/13/2020 - Review Complete 02/13/2020  Allergen Reaction Noted  . Sulfa antibiotics Nausea And Vomiting 09/08/2013    Family History  Problem Relation Age of Onset  . Heart failure Mother   . Heart failure Father     Social History   Socioeconomic History  . Marital status: Single    Spouse name: Not on file  . Number of children: Not on file  . Years of education: Not on file  .  Highest education level: Not on file  Occupational History  . Not on file  Tobacco Use  . Smoking status: Never Smoker  . Smokeless tobacco: Never Used  Vaping Use  . Vaping Use: Never used  Substance and Sexual Activity  . Alcohol use: No  . Drug use: No  . Sexual activity: Not on file  Other Topics Concern  . Not on file  Social History Narrative   Marital status: single      Children: none      Employment: semi-retired;       Tobacco: none      Alcohol: none         Social Determinants of Radio broadcast assistant Strain:   . Difficulty of Paying Living Expenses:   Food Insecurity:   . Worried About Charity fundraiser in the Last Year:   . Arboriculturist in the Last Year:   Transportation Needs:   . Film/video editor (Medical):   Marland Kitchen Lack of Transportation (Non-Medical):   Physical Activity:   . Days of Exercise per Week:   . Minutes of Exercise per Session:   Stress:   . Feeling of Stress :   Social Connections:   . Frequency of Communication with Friends and Family:   . Frequency of Social  Gatherings with Friends and Family:   . Attends Religious Services:   . Active Member of Clubs or Organizations:   . Attends Archivist Meetings:   Marland Kitchen Marital Status:   Intimate Partner Violence:   . Fear of Current or Ex-Partner:   . Emotionally Abused:   Marland Kitchen Physically Abused:   . Sexually Abused:     Review of Systems:    Constitutional: No weight loss, fever or chills Skin: No rash  Cardiovascular: No chest pain Respiratory: No SOB Gastrointestinal: See HPI and otherwise negative Genitourinary: No dysuria Neurological: No headache, dizziness or syncope Musculoskeletal: No new muscle or joint pain Hematologic: No bruising Psychiatric: No history of depression or anxiety   Physical Exam:  Vital signs: BP 140/90   Pulse 74   Ht 5\' 9"  (1.753 m)   Wt (!) 311 lb (141.1 kg)   BMI 45.93 kg/m   Constitutional:   Pleasant obese Caucasian male  appears to be in NAD, Well developed, Well nourished, alert and cooperative Head:  Normocephalic and atraumatic. Eyes:   PEERL, EOMI. No icterus. Conjunctiva pink. Ears:  Normal auditory acuity. Neck:  Supple Throat: Oral cavity and pharynx without inflammation, swelling or lesion.  Respiratory: Respirations even and unlabored. Lungs clear to auscultation bilaterally.   No wheezes, crackles, or rhonchi.  Cardiovascular: Normal S1, S2. No MRG. Regular rate and rhythm. No peripheral edema, cyanosis or pallor.  Gastrointestinal:  Soft, nondistended, nontender. No rebound or guarding. Normal bowel sounds. No appreciable masses or hepatomegaly. Rectal:  Not performed.  Msk:  Symmetrical without gross deformities. Without edema, no deformity or joint abnormality.  Neurologic:  Alert and  oriented x4;  grossly normal neurologically.  Skin:   Dry and intact without significant lesions or rashes. Psychiatric: Demonstrates good judgement and reason without abnormal affect or behaviors.  RELEVANT LABS AND IMAGING: CBC    Component Value Date/Time   WBC 3.6 (L) 10/15/2019 2008   RBC 3.90 (L) 10/15/2019 2008   HGB 13.1 10/15/2019 2008   HGB 13.3 12/24/2016 1405   HCT 39.8 10/15/2019 2008   HCT 38.8 12/24/2016 1405   PLT 85 (L) 10/15/2019 2008   PLT 138 (L) 12/24/2016 1405   MCV 102.1 (H) 10/15/2019 2008   MCV 97 12/24/2016 1405   MCH 33.6 10/15/2019 2008   MCHC 32.9 10/15/2019 2008   RDW 13.9 10/15/2019 2008   RDW 13.8 12/24/2016 1405   LYMPHSABS 0.9 03/23/2019 0436   LYMPHSABS 1.4 12/24/2016 1405   MONOABS 0.6 03/23/2019 0436   EOSABS 0.2 03/23/2019 0436   EOSABS 0.2 12/24/2016 1405   BASOSABS 0.0 03/23/2019 0436   BASOSABS 0.0 12/24/2016 1405    CMP     Component Value Date/Time   NA 141 10/15/2019 2008   NA 141 12/24/2016 1405   K 3.7 10/15/2019 2008   CL 108 10/15/2019 2008   CO2 23 10/15/2019 2008   GLUCOSE 92 10/15/2019 2008   BUN 14 10/15/2019 2008   BUN 11 12/24/2016  1405   CREATININE 0.91 10/15/2019 2008   CALCIUM 8.8 (L) 10/15/2019 2008   PROT 7.1 03/25/2019 0422   PROT 7.7 12/24/2016 1405   ALBUMIN 3.0 (L) 03/25/2019 0422   ALBUMIN 3.5 12/24/2016 1405   AST 25 03/25/2019 0422   ALT 20 03/25/2019 0422   ALKPHOS 72 03/25/2019 0422   BILITOT 0.9 03/25/2019 0422   BILITOT 0.6 12/24/2016 1405   GFRNONAA >60 10/15/2019 2008   GFRAA >60 10/15/2019 2008  Assessment: 1.  Constipation: Resolved per patient after starting prune juice 2.  Rectal bleeding: Initially thought from constipation and hemorrhoids, but is continuing now the patient is no longer constipated, has never had a colonoscopy; consider hemorrhoids versus polyps versus other  Plan: 1.  Scheduled patient for a diagnostic colonoscopy for his rectal bleeding in the [place] with Dr. Silverio Decamp.  Did discuss risks, benefits, limitations and alternatives and the patient agrees to proceed. 2.  Continue prune juice for constipation 3.  Patient to follow in clinic per recommendations from Dr. Silverio Decamp after time of procedure.  Ellouise Newer, PA-C [facility] 02/13/2020, 2:35 PM  Cc: Administration, [employer]

## 2020-02-13 NOTE — Patient Instructions (Signed)
If you are age 78 or older, your body mass index should be between 23-30. Your Body mass index is 45.93 kg/m. If this is out of the aforementioned range listed, please consider follow up with your Primary Care Provider.  If you are age 64 or younger, your body mass index should be between 19-25. Your Body mass index is 45.93 kg/m. If this is out of the aformentioned range listed, please consider follow up with your Primary Care Provider.   You have been scheduled for a colonoscopy. Please follow written instructions given to you at your visit today.  Please pick up your prep supplies at the pharmacy within the next 1-3 days. If you use inhalers (even only as needed), please bring them with you on the day of your procedure.  Follow up pending the results of your Colonoscopy. 

## 2020-02-15 ENCOUNTER — Ambulatory Visit (INDEPENDENT_AMBULATORY_CARE_PROVIDER_SITE_OTHER): Payer: Medicare PPO

## 2020-02-15 ENCOUNTER — Other Ambulatory Visit: Payer: Self-pay | Admitting: Gastroenterology

## 2020-02-15 DIAGNOSIS — Z1159 Encounter for screening for other viral diseases: Secondary | ICD-10-CM

## 2020-02-16 LAB — SARS CORONAVIRUS 2 (TAT 6-24 HRS): SARS Coronavirus 2: NEGATIVE

## 2020-02-17 ENCOUNTER — Other Ambulatory Visit (INDEPENDENT_AMBULATORY_CARE_PROVIDER_SITE_OTHER): Payer: Medicare PPO

## 2020-02-17 ENCOUNTER — Other Ambulatory Visit: Payer: Self-pay

## 2020-02-17 ENCOUNTER — Encounter: Payer: Self-pay | Admitting: Gastroenterology

## 2020-02-17 ENCOUNTER — Ambulatory Visit (AMBULATORY_SURGERY_CENTER): Payer: Medicare PPO | Admitting: Gastroenterology

## 2020-02-17 VITALS — BP 144/77 | HR 63 | Temp 97.7°F | Resp 16 | Ht 69.0 in | Wt 311.0 lb

## 2020-02-17 DIAGNOSIS — K625 Hemorrhage of anus and rectum: Secondary | ICD-10-CM | POA: Diagnosis not present

## 2020-02-17 DIAGNOSIS — K6289 Other specified diseases of anus and rectum: Secondary | ICD-10-CM

## 2020-02-17 DIAGNOSIS — D122 Benign neoplasm of ascending colon: Secondary | ICD-10-CM | POA: Diagnosis not present

## 2020-02-17 DIAGNOSIS — D123 Benign neoplasm of transverse colon: Secondary | ICD-10-CM | POA: Diagnosis not present

## 2020-02-17 DIAGNOSIS — K921 Melena: Secondary | ICD-10-CM

## 2020-02-17 DIAGNOSIS — C20 Malignant neoplasm of rectum: Secondary | ICD-10-CM

## 2020-02-17 LAB — BASIC METABOLIC PANEL
BUN: 7 mg/dL (ref 6–23)
CO2: 22 mEq/L (ref 19–32)
Calcium: 8.7 mg/dL (ref 8.4–10.5)
Chloride: 105 mEq/L (ref 96–112)
Creatinine, Ser: 0.85 mg/dL (ref 0.40–1.50)
GFR: 87.14 mL/min (ref >60.00)
Glucose, Bld: 98 mg/dL (ref 70–99)
Potassium: 3.6 mEq/L (ref 3.5–5.1)
Sodium: 141 mEq/L (ref 135–145)

## 2020-02-17 MED ORDER — SODIUM CHLORIDE 0.9 % IV SOLN: 500.0000 mL | Freq: Once | INTRAVENOUS | Status: DC

## 2020-02-17 NOTE — Op Note (Signed)
[name] Procedure Date: 02/17/2020 7:49 AM MRN: [id number] Endoscopist: Mauri Pole , MD Age: 79 Referring MD:  Date of Birth: 09-05-1940 Gender: Male Account #: [id number] Procedure:                Colonoscopy Indications:              Evaluation of unexplained GI bleeding presenting                            with Hematochezia Medicines:                Monitored Anesthesia Care Procedure:                Pre-Anesthesia Assessment:                           - Prior to the procedure, a History and Physical                            was performed, and patient medications and                            allergies were reviewed. The patient's tolerance of                            previous anesthesia was also reviewed. The risks                            and benefits of the procedure and the sedation                            options and risks were discussed with the patient.                            All questions were answered, and informed consent                            was obtained. Prior Anticoagulants: The patient has                            taken no previous anticoagulant or antiplatelet                            agents. ASA Grade Assessment: III - A patient with                            severe systemic disease. After reviewing the risks                            and benefits, the patient was deemed in                            satisfactory condition to undergo the procedure.  After obtaining informed consent, the colonoscope                            was passed under direct vision. Throughout the                            procedure, the patient's blood pressure, pulse, and                            oxygen saturations were monitored continuously. The                            Colonoscope was introduced through the anus and                            advanced to the the cecum, identified  by                            appendiceal orifice and ileocecal valve. The                            colonoscopy was performed without difficulty. The                            patient tolerated the procedure well. The quality                            of the bowel preparation was fair. The ileocecal                            valve, appendiceal orifice, and rectum were                            photographed. Scope In: 8:12:24 AM Scope Out: 8:36:22 AM Scope Withdrawal Time: 0 hours 17 minutes 45 seconds  Total Procedure Duration: 0 hours 23 minutes 58 seconds  Findings:                 The perianal and digital rectal examinations were                            normal.                           Four semi-pedunculated polyps were found in the                            transverse colon and ascending colon. The polyps                            were 5 to 12 mm in size. These polyps were removed                            with a hot snare. Resection and retrieval were  complete.                           Scattered small and large-mouthed diverticula were                            found in the sigmoid colon, descending colon,                            transverse colon and ascending colon.                           An infiltrative non-obstructing large mass was                            found in the rectum. The mass was partially                            circumferential (involving one-third of the lumen                            circumference). The mass measured five cm in length                            extending from 10 to 15cm from anal verge. Oozing                            was present. This was biopsied with a cold forceps                            for histology. Opposite fold area was tattooed with                            an injection of 2 mL of Spot (carbon black) to mark.                           Non-bleeding internal hemorrhoids were found  during                            retroflexion. The hemorrhoids were medium-sized. Complications:            No immediate complications. Estimated Blood Loss:     Estimated blood loss was minimal. Impression:               - Preparation of the colon was fair.                           - Four 5 to 12 mm polyps in the transverse colon                            and in the ascending colon, removed with a hot                            snare. Resected and retrieved.                           -  Diverticulosis in the sigmoid colon, in the                            descending colon, in the transverse colon and in                            the ascending colon.                           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 Biopsied.                            Tattooed.                           - Non-bleeding internal hemorrhoids. Recommendation:           - Patient has a contact number available for                            emergencies. The signs and symptoms of potential                            delayed complications were discussed with the                            patient. Return to normal activities tomorrow.                            Written discharge instructions were provided to the                            patient.                           - Resume previous diet.                           - Continue present medications.                           - Await pathology results.                           - Repeat colonoscopy date to be determined after                            pending pathology results are reviewed for                            surveillance based on pathology results.                           - Perform CT scan (computed tomography) of the                            chest, abdomen and pelvis with  contrast at                            appointment to be scheduled. Mauri Pole, MD 02/17/2020 8:47:11 AM This report has been signed electronically.

## 2020-02-17 NOTE — Progress Notes (Signed)
Called to room to assist during endoscopic procedure.  Patient ID and intended procedure confirmed with present staff. Received instructions for my participation in the procedure from the performing physician.  

## 2020-02-17 NOTE — Progress Notes (Signed)
VS by VV

## 2020-02-17 NOTE — Progress Notes (Signed)
pt tolerated well. VSS. awake and to recovery. Report given to RN.  

## 2020-02-17 NOTE — Patient Instructions (Signed)
To the lab today for blood work prior to CT scan.  Someone will call you with instructions for your CT scan, have the paper given to you today available.  Handouts given for hemorrhoids, polyps and diverticulosis.  YOU HAD AN ENDOSCOPIC PROCEDURE TODAY AT [facility]:   Refer to the procedure report that was given to you for any specific questions about what was found during the examination.  If the procedure report does not answer your questions, please call your gastroenterologist to clarify.  If you requested that your care partner not be given the details of your procedure findings, then the procedure report has been included in a sealed envelope for you to review at your convenience later.  YOU SHOULD EXPECT: Some feelings of bloating in the abdomen. Passage of more gas than usual.  Walking can help get rid of the air that was put into your GI tract during the procedure and reduce the bloating. If you had a lower endoscopy (such as a colonoscopy or flexible sigmoidoscopy) you may notice spotting of blood in your stool or on the toilet paper. If you underwent a bowel prep for your procedure, you may not have a normal bowel movement for a few days.  Please Note:  You might notice some irritation and congestion in your nose or some drainage.  This is from the oxygen used during your procedure.  There is no need for concern and it should clear up in a day or so.  SYMPTOMS TO REPORT IMMEDIATELY:   Following lower endoscopy (colonoscopy or flexible sigmoidoscopy):  Excessive amounts of blood in the stool  Significant tenderness or worsening of abdominal pains  Swelling of the abdomen that is new, acute  Fever of 100F or higher  For urgent or emergent issues, a gastroenterologist can be reached at any hour by calling [phone]. Do not use MyChart messaging for urgent concerns.    DIET:  We do recommend a small meal at first, but then you may proceed to your regular diet.   Drink plenty of fluids but you should avoid alcoholic beverages for 24 hours.  ACTIVITY:  You should plan to take it easy for the rest of today and you should NOT DRIVE or use heavy machinery until tomorrow (because of the sedation medicines used during the test).    FOLLOW UP: Our staff will call the number listed on your records 48-72 hours following your procedure to check on you and address any questions or concerns that you may have regarding the information given to you following your procedure. If we do not reach you, we will leave a message.  We will attempt to reach you two times.  During this call, we will ask if you have developed any symptoms of COVID 19. If you develop any symptoms (ie: fever, flu-like symptoms, shortness of breath, cough etc.) before then, please call [phone].  If you test positive for Covid 19 in the 2 weeks post procedure, please call and report this information to [place].    If any biopsies were taken you will be contacted by phone or by letter within the next 1-3 weeks.  Please call us at [phone] if you have not heard about the biopsies in 3 weeks.    SIGNATURES/CONFIDENTIALITY: You and/or your care partner have signed paperwork which will be entered into your electronic medical record.  These signatures attest to the fact that that the information above on your After Visit Summary has been  reviewed and is understood.  Full responsibility of the confidentiality of this discharge information lies with you and/or your care-partner. 

## 2020-02-20 ENCOUNTER — Other Ambulatory Visit: Payer: Self-pay

## 2020-02-20 DIAGNOSIS — K625 Hemorrhage of anus and rectum: Secondary | ICD-10-CM

## 2020-02-20 DIAGNOSIS — K6289 Other specified diseases of anus and rectum: Secondary | ICD-10-CM

## 2020-02-20 DIAGNOSIS — K921 Melena: Secondary | ICD-10-CM

## 2020-02-21 ENCOUNTER — Telehealth: Payer: Self-pay

## 2020-02-21 ENCOUNTER — Other Ambulatory Visit: Payer: Self-pay

## 2020-02-21 ENCOUNTER — Telehealth: Payer: Self-pay | Admitting: Hematology

## 2020-02-21 DIAGNOSIS — C189 Malignant neoplasm of colon, unspecified: Secondary | ICD-10-CM

## 2020-02-21 NOTE — Telephone Encounter (Signed)
CT imaging chest, abdomen and pelvis scheduled for 02/24/20 [facility]. Must pick up the prep from the [facility] Dept in [place] before the day of the appointment.  Records faxed to Dr Leighton Ruff requesting an appointment.

## 2020-02-21 NOTE — Telephone Encounter (Signed)
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 Pt has been cld and scheduled to see Dr. Burr Medico on 7/7 at 3pm. Pt aware to arrive 15 minutes early.

## 2020-02-21 NOTE — Telephone Encounter (Signed)
  Follow up Call-  Call back number 02/17/2020  Post procedure Call Back phone  # [phone]  Permission to leave phone message Yes  Some recent data might be hidden     Patient questions:  Do you have a fever, pain , or abdominal swelling? No. Pain Score  0 *  Have you tolerated food without any problems? Yes.    Have you been able to return to your normal activities? Yes.    Do you have any questions about your discharge instructions: Diet   No. Medications  No. Follow up visit  No.  Do you have questions or concerns about your Care? No.  Actions: * If pain score is 4 or above: No action needed, pain <4. 1. Have you developed a fever since your procedure? no  2.   Have you had an respiratory symptoms (SOB or cough) since your procedure? no  3.   Have you tested positive for COVID 19 since your procedure no  4.   Have you had any family members/close contacts diagnosed with the COVID 19 since your procedure?  no   If yes to any of these questions please route to Joylene John, RN and Erenest Rasher, RN

## 2020-02-22 NOTE — Progress Notes (Signed)
Spoke with patient and introduced myself as nurse navigator and explained my role.  I went over his upcoming appointments for next week including his CT scan at [facility] and how to drink the oral prep which he is going to come by and pick up and his initial medical oncology appointment with Dr. Burr Medico on 7/7 at 3 pm to arrive no later than 2:45 for registration.  He verbalized an understanding.

## 2020-02-22 NOTE — Telephone Encounter (Signed)
Moved the CT chest, abdomen, pelvis to 02/28/20 arrive at 1:15 pm to [facility].

## 2020-02-22 NOTE — Telephone Encounter (Signed)
Called the patient number. No answer. No voicemail.

## 2020-02-22 NOTE — Progress Notes (Addendum)
[place]   [phone] Fax:([phone] [place] Note   Patient Care Team: Administration, Veterans as PCP - General Truitt Merle, MD as Consulting Physician (Oncology) Jonnie Finner, RN as Oncology Nurse Navigator  Date of Service:  02/29/2020   CHIEF COMPLAINTS/PURPOSE OF CONSULTATION:  Newly diagnosed rectal cancer   REFERRING PHYSICIAN:  GI Dr Silverio Decamp  Oncology History Overview Note  Cancer Staging No matching staging information was found for the patient.    Rectal adenocarcinoma ([place])  02/17/2020 Procedure   Colonoscopy by Dr Silverio Decamp 02/17/20  IMPRESSION - Preparation of the colon was fair. - Four 5 to 12 mm polyps in the transverse colon and in the ascending colon, removed with a hot snare. Resected and retrieved. - Diverticulosis in the sigmoid colon, in the descending colon, in the transverse colon and in the ascending colon. 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 Biopsied. Tattooed. - Non-bleeding internal hemorrhoids.   02/17/2020 Initial Biopsy   Diagnosis 02/17/20 1. Surgical [P], colon, transverse and ascending, polyp (4) - TUBULAR ADENOMA, NEGATIVE FOR HIGH GRADE DYSPLASIA (X MULTIPLE). 2. Surgical [P], colon, rectal mass bx - ADENOCARCINOMA. Microscopic Comment 2. IHC for MMR will be reported separately. Results reported to Dr. Harl Bowie on 02/21/2020. Dr. Melina Copa reviewed the case.    MMR - Normal  MLH1- preserved nuclear expression MSH2 - preserved nuclear expression MSH6 - preserved nuclear expression PMS2 preserved nuclear expression   02/28/2020 Imaging   CT CAP w contrast  IMPRESSION: 1. Mild circumferential wall thickening in the mid rectum, which may represent the known primary rectal malignancy. 2. Bilateral inguinal, bilateral common and external iliac, right internal iliac and left para-aortic lymphadenopathy, stable to mildly increased since 11/30/2018 CT, indeterminate for  metastatic disease or lymphoproliferative disorder. Suggest PET-CT for further characterization and potential biopsy planning. 3. Solid 7 mm basilar left lower lobe pulmonary nodule, appearing new since 11/30/2018 CT abdomen study, cannot exclude isolated pulmonary metastasis. Recommend attention on follow-up chest CT in 3 months. 4. Calcified thoracic adenopathy is nonspecific with differential including prior granulomatous disease or treated lymphoma. 5. Nonspecific fibrosis at the lung bases, cannot exclude interstitial lung disease including UIP. 6. Chronic findings include: Cirrhosis. Moderate splenomegaly. Cholelithiasis. Nonobstructing right nephrolithiasis. Mild sigmoid diverticulosis. Mildly enlarged prostate. Three-vessel coronary atherosclerosis. Aortic Atherosclerosis (ICD10-I70.0).   02/29/2020 Initial Diagnosis   Rectal adenocarcinoma ([facility])      HISTORY OF PRESENTING ILLNESS:  Eric Schultz 79 y.o. male is a here because of newly diagnosed rectal cancer. The patient was referred by GI Dr Silverio Decamp. The patient presents to the clinic today alone. He notes he is hard of hearing and needs hearing aids.   He has had rectal bleeding for the past 6 months. He has this with every BM. He will have only mild about of blood in his stool with some blood clots. He notes he is constipated which requires him to strain. He is not currently on stool softener or laxative.Marland Kitchen He denies abdominal pain. His eating is fair, but no major weight loss. His energy is fair.  He lives independently and functions well  Today he notes he has lost a few pounds in the past few days due to currently mouth sore.  He notes he is bleeding from the soles of his feet bilaterally. He has LE edema and dry skin venous stasis dermatitis and Cellulitis. He has been seen by [facility] doctor before but not recently. He notes right Posterior neck  pain radiates to occipital lobe and shoulder for the past 3-4 weeks. He feels  ibuprofen or tylenol is not enough and not interested in trying heating pad.   Socially he lives alone in a house in [place]. He notes he has no children and he is single. He has had 3 brothers, 1 passed away and 2 live on the [place]. He has few family members and friends in [place]. He has rare history of alcohol use or smoking. 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 He is retired overall, but willing to work for money. He is currently living on Social security.   They have a PMHx of hard of hearing. He has venous stasis and cellulitis of LE. He denies family history of cancer.    REVIEW OF SYSTEMS:    Constitutional: Denies fevers, chills or abnormal night sweats Eyes: Denies blurriness of vision, double vision or watery eyes Ears, nose, mouth, throat, and face: Denies mucositis or sore throat (+) Healing mouth sore  Respiratory: Denies cough, dyspnea or wheezes Cardiovascular: Denies palpitation, chest discomfort (+) lower extremity swelling Gastrointestinal:  Denies nausea, heartburn (+) Constipation.  Skin: Denies abnormal skin rashes (+) Venous stasis and Cellulitis of LE, bleeding from feet Lymphatics: Denies new lymphadenopathy or easy bruising Neurological:Denies numbness, tingling or new weaknesses (+) right Posterior neck pain radiates to occipital lobe and shoulder  Behavioral/Psych: Mood is stable, no new changes  All other systems were reviewed with the patient and are negative.   MEDICAL HISTORY:  Past Medical History:  Diagnosis Date  . Allergy   . Cellulitis    left lower leg  . Gout   . HOH (hard of hearing)   . Hypertension    "borderline"  . Sleep apnea     SURGICAL HISTORY: History reviewed. No pertinent surgical history.  SOCIAL HISTORY: Social History   Socioeconomic History  . Marital status: Single    Spouse name: Not on file  . Number of children: 0  . Years of education: Not on  file  . Highest education level: Not on file  Occupational History  . Occupation: retired Barista   Tobacco Use  . Smoking status: Never Smoker  . Smokeless tobacco: Never Used  Vaping Use  . Vaping Use: Never used  Substance and Sexual Activity  . Alcohol use: No  . Drug use: No  . Sexual activity: Not on file  Other Topics Concern  . Not on file  Social History Narrative   Marital status: single      Children: none      Employment: retired;       Tobacco: none      Alcohol: none         Social Determinants of Radio broadcast assistant Strain:   . Difficulty of Paying Living Expenses:   Food Insecurity:   . Worried About Charity fundraiser in the Last Year:   . Arboriculturist in the Last Year:   Transportation Needs:   . Film/video editor (Medical):   Marland Kitchen Lack of Transportation (Non-Medical):   Physical Activity:   . Days of Exercise per Week:   . Minutes of Exercise per Session:   Stress:   . Feeling of Stress :   Social Connections:   . Frequency of Communication with Friends and Family:   . Frequency of Social Gatherings with Friends and Family:   .  Attends Religious Services:   . Active Member of Clubs or Organizations:   . Attends Archivist Meetings:   Marland Kitchen Marital Status:   Intimate Partner Violence:   . Fear of Current or Ex-Partner:   . Emotionally Abused:   Marland Kitchen Physically Abused:   . Sexually Abused:     FAMILY HISTORY: Family History  Problem Relation Age of Onset  . Heart failure Mother   . Heart failure Father   . Stroke Father   . Colon cancer Neg Hx   . Esophageal cancer Neg Hx   . Rectal cancer Neg Hx   . Stomach cancer Neg Hx     ALLERGIES:  is allergic to sulfa antibiotics.  MEDICATIONS:  No current outpatient medications on file.   No current facility-administered medications for this visit.    PHYSICAL EXAMINATION: ECOG PERFORMANCE STATUS: 1 - Symptomatic but completely ambulatory  Vitals:   02/29/20  1532  BP: (!) 157/107  Pulse: (!) 54  Resp: 18  Temp: 98.1 F (36.7 C)  SpO2: 96%   Filed Weights   02/29/20 1532  Weight: (!) 308 lb (139.7 kg)    GENERAL:alert, no distress and comfortable SKIN: skin color, texture, turgor are normal, no rashes or significant lesions (+) fungal infection with dry skin, and skin erythema of feet.  EYES: normal, Conjunctiva are pink and non-injected, sclera clear  NECK: supple, thyroid normal size, non-tender, without nodularity LYMPH:  no palpable lymphadenopathy in the cervical, axillary (+) B/l enlarged inguinal LNs, palpable up to 2cm.  LUNGS: clear to auscultation and percussion with normal breathing effort HEART: regular rate & rhythm and no murmurs and no lower extremity edema ABDOMEN:abdomen soft, non-tender and normal bowel sounds Musculoskeletal:no cyanosis of digits and no clubbing  NEURO: alert & oriented x 3 with fluent speech, no focal motor/sensory deficits  LABORATORY DATA:  I have reviewed the data as listed CBC Latest Ref Rng & Units 10/15/2019 03/25/2019 03/24/2019  WBC 4.0 - 10.5 K/uL 3.6(L) 4.7 4.5  Hemoglobin 13.0 - 17.0 g/dL 13.1 13.3 13.3  Hematocrit 39 - 52 % 39.8 38.6(L) 38.7(L)  Platelets 150 - 400 K/uL 85(L) 111(L) 93(L)    CMP Latest Ref Rng & Units 02/17/2020 10/15/2019 03/25/2019  Glucose 70 - 99 mg/dL 98 92 97  BUN 6 - 23 mg/dL '7 14 19  ' Creatinine 0.40 - 1.50 mg/dL 0.85 0.91 0.94  Sodium 135 - 145 mEq/L 141 141 140  Potassium 3.5 - 5.1 mEq/L 3.6 3.7 3.5  Chloride 96 - 112 mEq/L 105 108 106  CO2 19 - 32 mEq/L '22 23 26  ' Calcium 8.4 - 10.5 mg/dL 8.7 8.8(L) 8.3(L)  Total Protein 6.5 - 8.1 g/dL - - 7.1  Total Bilirubin 0.3 - 1.2 mg/dL - - 0.9  Alkaline Phos 38 - 126 U/L - - 72  AST 15 - 41 U/L - - 25  ALT 0 - 44 U/L - - 20     RADIOGRAPHIC STUDIES: I have personally reviewed the radiological images as listed and agreed with the findings in the report. CT CHEST W CONTRAST  Result Date: 02/29/2020 CLINICAL DATA:   New diagnosis of rectal adenocarcinoma on 02/17/2020. Staging evaluation. EXAM: CT CHEST, ABDOMEN, AND PELVIS WITH CONTRAST TECHNIQUE: Multidetector CT imaging of the chest, abdomen and pelvis was performed following the standard protocol during bolus administration of intravenous contrast. CONTRAST:  151m OMNIPAQUE IOHEXOL 300 MG/ML  SOLN COMPARISON:  11/30/2018 and head CT abdomen/pelvis. FINDINGS: CT CHEST FINDINGS Cardiovascular: Top-normal  heart size. No significant pericardial effusion/thickening. Three-vessel coronary atherosclerosis. Atherosclerotic nonaneurysmal thoracic aorta. Normal caliber pulmonary arteries. No central pulmonary emboli. Mediastinum/Nodes: No discrete thyroid nodules. Unremarkable esophagus. No axillary adenopathy. Several enlarged bilateral paratracheal, subcarinal, AP window and left supraclavicular lymph nodes with coarse calcification suggesting prior granulomatous disease, for example measuring 1.8 cm in the right paratracheal chain (series 3/image 16), 1.1 cm in the left supraclavicular chain (series 3/image 5) and 1.3 cm in the subcarinal chain (series 3/image 32). No pathologically enlarged hilar nodes. Calcified subcentimeter bilateral hilar nodes. Lungs/Pleura: No pneumothorax. No pleural effusion. Basilar left lower lobe 7 mm solid pulmonary nodule (series 7/image 109), appearing new since 11/30/2018 CT abdomen study. Posterior right middle lobe 4 mm solid pulmonary nodule (series 7/image 90), stable since 11/30/2018 CT abdomen study and considered benign. No acute consolidative airspace disease, lung masses or additional significant pulmonary nodules. Patchy confluent subpleural reticulation and ground-glass opacity at both lung bases with associated mild traction bronchiectasis and architectural distortion, not definitely changed since 11/30/2018 CT abdomen study. Musculoskeletal: No aggressive appearing focal osseous lesions. Moderate thoracic spondylosis. CT ABDOMEN  PELVIS FINDINGS Hepatobiliary: Diffusely irregular liver surface with relative hypertrophy of the lateral segment left liver lobe, compatible cirrhosis. No liver masses. Cholelithiasis. No biliary ductal dilatation. Pancreas: Normal, with no mass or duct dilation. Spleen: Moderate splenomegaly. Craniocaudal splenic length 17.9 cm, stable. No splenic mass. Adrenals/Urinary Tract: Normal adrenals. No hydronephrosis. Nonobstructing 3 mm upper right renal stone. Minimally complex 5.2 cm exophytic renal cyst in the lateral interpolar left kidney with thin internal septation (series 3/image 82). Simple partially exophytic 2.6 cm lateral upper right renal cyst. Numerous subcentimeter hypodense renal cortical lesions in both kidneys are too small to characterize and require no follow-up. Normal bladder. Stomach/Bowel: Normal non-distended stomach. Normal caliber small bowel with no small bowel wall thickening. Normal appendix. Mild circumferential wall thickening in the mid rectum (series 3/image 119). Mild sigmoid diverticulosis with no acute pericolonic fat stranding. Vascular/Lymphatic: Atherosclerotic nonaneurysmal abdominal aorta. Patent portal, splenic, hepatic and renal veins. Bilateral inguinal lymphadenopathy, largest 2.5 cm on the left (series 3/image 135) (not previously imaged) and 1.6 cm on the right (series 3/image 129), stable since 11/30/2018 CT. Bilateral common and external iliac lymphadenopathy measuring up to 1.7 cm in the left external iliac chain (series 3/image 111), increased from 1.4 cm on 11/30/2018 CT. Newly mildly enlarged 0.9 cm right internal iliac node (series 3/image 108). Enlarged 1.4 cm left para-aortic node (series 3/image 89), not appreciably changed. Reproductive: Mildly enlarged prostate. Other: No pneumoperitoneum, ascites or focal fluid collection. Homogeneous fat density right retroperitoneal 5.7 x 3.7 cm mass between the pancreatic head and IVC (series 3/image 65), stable,  presumably a benign lipoma. Stable small to moderate fat containing umbilical hernia. Musculoskeletal: No aggressive appearing focal osseous lesions. Moderate lumbar spondylosis. IMPRESSION: 1. Mild circumferential wall thickening in the mid rectum, which may represent the known primary rectal malignancy. 2. Bilateral inguinal, bilateral common and external iliac, right internal iliac and left para-aortic lymphadenopathy, stable to mildly increased since 11/30/2018 CT, indeterminate for metastatic disease or lymphoproliferative disorder. Suggest PET-CT for further characterization and potential biopsy planning. 3. Solid 7 mm basilar left lower lobe pulmonary nodule, appearing new since 11/30/2018 CT abdomen study, cannot exclude isolated pulmonary metastasis. Recommend attention on follow-up chest CT in 3 months. 4. Calcified thoracic adenopathy is nonspecific with differential including prior granulomatous disease or treated lymphoma. 5. Nonspecific fibrosis at the lung bases, cannot exclude interstitial lung disease including UIP. 6.  Chronic findings include: Cirrhosis. Moderate splenomegaly. Cholelithiasis. Nonobstructing right nephrolithiasis. Mild sigmoid diverticulosis. Mildly enlarged prostate. Three-vessel coronary atherosclerosis. Aortic Atherosclerosis (ICD10-I70.0). Electronically Signed   By: Ilona Sorrel M.D.   On: 02/29/2020 09:00   CT ABDOMEN PELVIS W CONTRAST  Result Date: 02/29/2020 CLINICAL DATA:  New diagnosis of rectal adenocarcinoma on 02/17/2020. Staging evaluation. EXAM: CT CHEST, ABDOMEN, AND PELVIS WITH CONTRAST TECHNIQUE: Multidetector CT imaging of the chest, abdomen and pelvis was performed following the standard protocol during bolus administration of intravenous contrast. CONTRAST:  17m OMNIPAQUE IOHEXOL 300 MG/ML  SOLN COMPARISON:  11/30/2018 and head CT abdomen/pelvis. FINDINGS: CT CHEST FINDINGS Cardiovascular: Top-normal heart size. No significant pericardial  effusion/thickening. Three-vessel coronary atherosclerosis. Atherosclerotic nonaneurysmal thoracic aorta. Normal caliber pulmonary arteries. No central pulmonary emboli. Mediastinum/Nodes: No discrete thyroid nodules. Unremarkable esophagus. No axillary adenopathy. Several enlarged bilateral paratracheal, subcarinal, AP window and left supraclavicular lymph nodes with coarse calcification suggesting prior granulomatous disease, for example measuring 1.8 cm in the right paratracheal chain (series 3/image 16), 1.1 cm in the left supraclavicular chain (series 3/image 5) and 1.3 cm in the subcarinal chain (series 3/image 32). No pathologically enlarged hilar nodes. Calcified subcentimeter bilateral hilar nodes. Lungs/Pleura: No pneumothorax. No pleural effusion. Basilar left lower lobe 7 mm solid pulmonary nodule (series 7/image 109), appearing new since 11/30/2018 CT abdomen study. Posterior right middle lobe 4 mm solid pulmonary nodule (series 7/image 90), stable since 11/30/2018 CT abdomen study and considered benign. No acute consolidative airspace disease, lung masses or additional significant pulmonary nodules. Patchy confluent subpleural reticulation and ground-glass opacity at both lung bases with associated mild traction bronchiectasis and architectural distortion, not definitely changed since 11/30/2018 CT abdomen study. Musculoskeletal: No aggressive appearing focal osseous lesions. Moderate thoracic spondylosis. CT ABDOMEN PELVIS FINDINGS Hepatobiliary: Diffusely irregular liver surface with relative hypertrophy of the lateral segment left liver lobe, compatible cirrhosis. No liver masses. Cholelithiasis. No biliary ductal dilatation. Pancreas: Normal, with no mass or duct dilation. Spleen: Moderate splenomegaly. Craniocaudal splenic length 17.9 cm, stable. No splenic mass. Adrenals/Urinary Tract: Normal adrenals. No hydronephrosis. Nonobstructing 3 mm upper right renal stone. Minimally complex 5.2 cm  exophytic renal cyst in the lateral interpolar left kidney with thin internal septation (series 3/image 82). Simple partially exophytic 2.6 cm lateral upper right renal cyst. Numerous subcentimeter hypodense renal cortical lesions in both kidneys are too small to characterize and require no follow-up. Normal bladder. Stomach/Bowel: Normal non-distended stomach. Normal caliber small bowel with no small bowel wall thickening. Normal appendix. Mild circumferential wall thickening in the mid rectum (series 3/image 119). Mild sigmoid diverticulosis with no acute pericolonic fat stranding. Vascular/Lymphatic: Atherosclerotic nonaneurysmal abdominal aorta. Patent portal, splenic, hepatic and renal veins. Bilateral inguinal lymphadenopathy, largest 2.5 cm on the left (series 3/image 135) (not previously imaged) and 1.6 cm on the right (series 3/image 129), stable since 11/30/2018 CT. Bilateral common and external iliac lymphadenopathy measuring up to 1.7 cm in the left external iliac chain (series 3/image 111), increased from 1.4 cm on 11/30/2018 CT. Newly mildly enlarged 0.9 cm right internal iliac node (series 3/image 108). Enlarged 1.4 cm left para-aortic node (series 3/image 89), not appreciably changed. Reproductive: Mildly enlarged prostate. Other: No pneumoperitoneum, ascites or focal fluid collection. Homogeneous fat density right retroperitoneal 5.7 x 3.7 cm mass between the pancreatic head and IVC (series 3/image 65), stable, presumably a benign lipoma. Stable small to moderate fat containing umbilical hernia. Musculoskeletal: No aggressive appearing focal osseous lesions. Moderate lumbar spondylosis. IMPRESSION: 1. Mild circumferential wall thickening  in the mid rectum, which may represent the known primary rectal malignancy. 2. Bilateral inguinal, bilateral common and external iliac, right internal iliac and left para-aortic lymphadenopathy, stable to mildly increased since 11/30/2018 CT, indeterminate for  metastatic disease or lymphoproliferative disorder. Suggest PET-CT for further characterization and potential biopsy planning. 3. Solid 7 mm basilar left lower lobe pulmonary nodule, appearing new since 11/30/2018 CT abdomen study, cannot exclude isolated pulmonary metastasis. Recommend attention on follow-up chest CT in 3 months. 4. Calcified thoracic adenopathy is nonspecific with differential including prior granulomatous disease or treated lymphoma. 5. Nonspecific fibrosis at the lung bases, cannot exclude interstitial lung disease including UIP. 6. Chronic findings include: Cirrhosis. Moderate splenomegaly. Cholelithiasis. Nonobstructing right nephrolithiasis. Mild sigmoid diverticulosis. Mildly enlarged prostate. Three-vessel coronary atherosclerosis. Aortic Atherosclerosis (ICD10-I70.0). Electronically Signed   By: Ilona Sorrel M.D.   On: 02/29/2020 09:00    ASSESSMENT & PLAN:  Eric Schultz is a 80 y.o. Caucasian male with a history of Gout, hard of hearing, HTN, Sleep Apnea.    1. Rectal Cancer, cTxNxMx, MSI-stable  -I reviewed and discussed his colonosocpy biopsy results with him in great detail. He has a non-obstructing tumor in upper rectum causes GI bleeding. This was seen on 02/17/20 Colonoscopy and biopsy of mass showed adenocarcinoma.  -I personally reviewed and discussed his staging CT CAP from 02/28/20 which shows known rectal mass.  His CT scan showed mediastinal and diffuse abdominal and pelvic lymphadenopathy with largest 2.5 cm which have been stable since early 2020, and some contain calcification suggesting granulomatous disease.  He does have significant bilateral LE venous stasis and cellulitis, which can cause his inguinal and pelvic adenopathy also.  He has palpable lymph nodes in bilateral inguin on exam.  I think his adenopathy are probably not related to his rectal cancer, will review his case in GI conference next week.,  To see if we need a PET scan or biopsy for further  work-up.  -I recommend a pelvic MRI for further tumor and lymph node staging. He is agreeable.  -I discussed standard treatment for early stage rectal cancer includes surgery which is curative. He may require temporary colostomy bag. If he more locally advanced (T3 or positive node), neoadjuvant chemotherapy and Radiation treatment would be recommended. -He is open to surgery but apprehensive about chemotherapy. I spent significant amount time to explain the benefit and potential side effects from chemo, he has low health literacy, nausea, which he is able to comprehend.  He will think about it. -Physical exam today unremarkable except b/l inguinal palpable LNs up to 2 cm. I will obtain baseline labs to be done this week or next week. I discussed he is also eligible for [employer] study, he declined study.  -F/u open, depends on further work up and finally treatment plan.     2. GI bleeding, Constipation  -Secondary to #1  -For the past 6 months he has been having mild GI bleeding with constipation. He has not used stool softeners or laxative.  -He feels Miralax is not enough so I recommend he use Senakot, up to 8 tabs a day to have a good BM.    3. Liver Cirrhosis, new, unclear etiology  -I discussed his CT CAP from 02/28/20 shows liver cirrhosis which has been there before some time. He rarely drank alcohol, so this could be from fatty liver. I encouraged him to f/u with Dr. Silverio Decamp   4. Thrombocytopenia  -I discussed he has thrombocytopenia is likely from  his Liver cirrhosis. It's been chronic    4. LE Venous Stasis, Cellulitis, Fungal infection on feet  -He has chronic LE edema of lower legs and feet with red, dry skin. He notes this causes weeping occasionally along with bleeding.  -On exam today (7/721) his right foot appears with very dry skin and fungal infection which is currently dry. He notes this is the same as the left foot. I recommend he f/u with podiatrist for treatment.  -I  also recommend he elevate his feet when sitting to help his swelling. He declined compression sock use.    PLAN:  -Lab next week or this week. (CBC, CMP, CEA, iron study, B12 and folate level)  -MRI Pelvis w wo contrast in 1-2 weeks.  -Discuss case in next week's GI Tumor Board.  -Pt will be dismissed from my clinic, see my letter for detail. I will transfer his care to Dr. Benay Spice.    Orders Placed This Encounter  Procedures  . MR Pelvis W Wo Contrast    Standing Status:   Future    Standing Expiration Date:   02/28/2021    Order Specific Question:   If indicated for the ordered procedure, I authorize the administration of contrast media per Radiology protocol    Answer:   Yes    Order Specific Question:   What is the patient's sedation requirement?    Answer:   No Sedation    Order Specific Question:   Does the patient have a pacemaker or implanted devices?    Answer:   No    Order Specific Question:   Radiology Contrast Protocol - do NOT remove file path    Answer:   [email or web address]    Order Specific Question:   Preferred imaging location?    Answer:   [facility] (table limit - 550 lbs)    All questions were answered. The patient knows to call the clinic with any problems, questions or concerns. The total time spent in the appointment was 60 minutes.     Truitt Merle, MD 02/29/2020 4:36 PM  I, Joslyn Devon, am acting as scribe for Truitt Merle, MD.   I have reviewed the above documentation for accuracy and completeness, and I agree with the above.

## 2020-02-22 NOTE — Telephone Encounter (Signed)
Spoke with the patient. He agrees to the CT imaging appointment. I also told him to watch for a call from the [facility] and answer. It will be about his appointment to come in and discuss options.

## 2020-02-22 NOTE — Telephone Encounter (Signed)
Called the patient. No answer. Called the contact listed, Dorinda Brickle. She states she has little to no contact with the patient. States he does not answer her calls either. She agrees to let him know we are trying to reach him quickly about his results.

## 2020-02-23 NOTE — Progress Notes (Signed)
Reviewed and agree with documentation and assessment and plan. K. Veena Kiannah Grunow , MD   

## 2020-02-24 ENCOUNTER — Ambulatory Visit (HOSPITAL_COMMUNITY): Payer: Medicare PPO

## 2020-02-28 ENCOUNTER — Other Ambulatory Visit: Payer: Self-pay

## 2020-02-28 ENCOUNTER — Ambulatory Visit (HOSPITAL_COMMUNITY)
Admission: RE | Admit: 2020-02-28 | Discharge: 2020-02-28 | Disposition: A | Discharge status: Home / Self Care | Payer: Medicare PPO | Source: Ambulatory Visit | Attending: Gastroenterology | Admitting: Gastroenterology

## 2020-02-28 ENCOUNTER — Encounter (HOSPITAL_COMMUNITY): Payer: Self-pay

## 2020-02-28 DIAGNOSIS — C189 Malignant neoplasm of colon, unspecified: Secondary | ICD-10-CM | POA: Insufficient documentation

## 2020-02-28 MED ORDER — SODIUM CHLORIDE (PF) 0.9 % IJ SOLN
INTRAMUSCULAR | Status: AC
  Filled 2020-02-28: qty 50

## 2020-02-28 MED ORDER — IOHEXOL 300 MG/ML  SOLN
100.0000 mL | Freq: Once | INTRAMUSCULAR | Status: AC | PRN
  Administered 2020-02-28: 100 mL via INTRAVENOUS

## 2020-02-29 ENCOUNTER — Encounter: Payer: Self-pay | Admitting: Hematology

## 2020-02-29 ENCOUNTER — Other Ambulatory Visit: Payer: Self-pay

## 2020-02-29 ENCOUNTER — Inpatient Hospital Stay: Payer: Medicare PPO | Attending: Hematology | Admitting: Hematology

## 2020-02-29 VITALS — BP 157/107 | HR 54 | Temp 98.1°F | Resp 18 | Ht 69.0 in | Wt 308.0 lb

## 2020-02-29 DIAGNOSIS — D696 Thrombocytopenia, unspecified: Secondary | ICD-10-CM | POA: Insufficient documentation

## 2020-02-29 DIAGNOSIS — D7589 Other specified diseases of blood and blood-forming organs: Secondary | ICD-10-CM

## 2020-02-29 DIAGNOSIS — K7460 Unspecified cirrhosis of liver: Secondary | ICD-10-CM | POA: Diagnosis not present

## 2020-02-29 DIAGNOSIS — R600 Localized edema: Secondary | ICD-10-CM | POA: Insufficient documentation

## 2020-02-29 DIAGNOSIS — Z79899 Other long term (current) drug therapy: Secondary | ICD-10-CM | POA: Diagnosis not present

## 2020-02-29 DIAGNOSIS — K5900 Constipation, unspecified: Secondary | ICD-10-CM | POA: Insufficient documentation

## 2020-02-29 DIAGNOSIS — K922 Gastrointestinal hemorrhage, unspecified: Secondary | ICD-10-CM | POA: Insufficient documentation

## 2020-02-29 DIAGNOSIS — C20 Malignant neoplasm of rectum: Secondary | ICD-10-CM | POA: Insufficient documentation

## 2020-03-01 ENCOUNTER — Ambulatory Visit (HOSPITAL_COMMUNITY)
Admission: EM | Admit: 2020-03-01 | Discharge: 2020-03-01 | Disposition: A | Discharge status: Home / Self Care | Payer: Medicare PPO | Attending: Family Medicine | Admitting: Family Medicine

## 2020-03-01 ENCOUNTER — Telehealth: Payer: Self-pay | Admitting: Hematology

## 2020-03-01 ENCOUNTER — Other Ambulatory Visit: Payer: Self-pay

## 2020-03-01 ENCOUNTER — Encounter (HOSPITAL_COMMUNITY): Payer: Self-pay | Admitting: Emergency Medicine

## 2020-03-01 ENCOUNTER — Telehealth: Payer: Self-pay

## 2020-03-01 ENCOUNTER — Encounter: Payer: Self-pay | Admitting: Hematology

## 2020-03-01 DIAGNOSIS — S161XXA Strain of muscle, fascia and tendon at neck level, initial encounter: Secondary | ICD-10-CM

## 2020-03-01 MED ORDER — PREDNISONE 5 MG PO TABS
ORAL_TABLET | ORAL | 0 refills | Status: DC
Start: 2020-03-01 — End: 2020-05-02

## 2020-03-01 MED ORDER — BACLOFEN 10 MG PO TABS
5.0000 mg | ORAL_TABLET | Freq: Two times a day (BID) | ORAL | 0 refills | Status: DC | PRN
Start: 2020-03-01 — End: 2020-05-02

## 2020-03-01 NOTE — Telephone Encounter (Signed)
Unable to schedule appt per 7/7 los. Pt refused to schedule appt. Spoke with MD YF and she is aware.

## 2020-03-01 NOTE — Progress Notes (Signed)
Met with patient after his initial medical oncology appointment with Dr. Burr Medico.  Explained my role as nurse navigator and he was given my direct contact information.  He understands a referral has been made to Dr. Marcello Moores for surgical consideration and that he will be receiving a call from their office.

## 2020-03-01 NOTE — Discharge Instructions (Signed)
Please try heat  Please try range of motion of the neck  The muscle relaxer can make you sleepy. Please try it at night initially  Please follow up if your symptoms fail to improve.

## 2020-03-01 NOTE — ED Provider Notes (Signed)
[place]    CSN: [id number] Arrival date & time: 03/01/20  1649      History   Chief Complaint Chief Complaint  Patient presents with  . Neck Pain    HPI Eric Schultz is a 79 y.o. male.   He is presenting with right-sided neck pain.  This is ongoing for the past 3 weeks.  Denies any radicular symptoms.  He has not had neck pain this severe for this long before.  He has not tried any medications thus far.  It is worse with most movements.  No history of surgery.  HPI  Past Medical History:  Diagnosis Date  . Allergy   . Cellulitis    left lower leg  . Gout   . HOH (hard of hearing)   . Hypertension    "borderline"  . Sleep apnea     Patient Active Problem List   Diagnosis Date Noted  . 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 Cellulitis of foot, left 03/21/2019  . Chronic venous stasis dermatitis of both lower extremities 03/21/2019  . AKI (acute kidney injury) ([place]) 03/21/2019  . Acute on chronic respiratory failure with hypoxia ([place]) 03/21/2019    History reviewed. No pertinent surgical history.     Home Medications    Prior to Admission medications   Medication Sig Start Date End Date Taking? Authorizing Provider  baclofen (LIORESAL) 10 MG tablet Take 0.5 tablets (5 mg total) by mouth 2 (two) times daily as needed for muscle spasms. 03/01/20   Rosemarie Ax, MD  predniSONE (DELTASONE) 5 MG tablet Take 6 pills for first day, 5 pills second day, 4 pills third day, 3 pills fourth day, 2 pills the fifth day, and 1 pill sixth day. 03/01/20   Rosemarie Ax, MD  furosemide (LASIX) 20 MG tablet Take 1 tablet (20 mg total) by mouth daily. (please follow up with your PCP to check labs and weight within 1 week) 03/25/19 09/29/19  Elodia Florence., MD  potassium chloride SA (K-DUR) 20 MEQ tablet Take 1 tablet (20 mEq total) by mouth daily for 14 days. (follow up with your PCP for labs within 1 week) 03/25/19 09/29/19  Elodia Florence., MD     Family History Family History  Problem Relation Age of Onset  . Heart failure Mother   . Heart failure Father   . Stroke Father   . Colon cancer Neg Hx   . Esophageal cancer Neg Hx   . Rectal cancer Neg Hx   . Stomach cancer Neg Hx     Social History Social History   Tobacco Use  . Smoking status: Never Smoker  . Smokeless tobacco: Never Used  Vaping Use  . Vaping Use: Never used  Substance Use Topics  . Alcohol use: No  . Drug use: No     Allergies   Sulfa antibiotics   Review of Systems Review of Systems  See HPI  Physical Exam Triage Vital Signs ED Triage Vitals  Enc Vitals Group     BP 03/01/20 1729 (!) 146/82     Pulse Rate 03/01/20 1729 66     Resp 03/01/20 1729 (!) 22     Temp 03/01/20 1729 97.7 F (36.5 C)     Temp Source 03/01/20 1729 Oral     SpO2 03/01/20 1729 98 %     Weight --      Height --      Head Circumference --  Peak Flow --      Pain Score 03/01/20 1727 3     Pain Loc --      Pain Edu? --      Excl. in [place]? --    No data found.  Updated Vital Signs BP (!) 146/82 (BP Location: Left Arm) Comment (BP Location): large cuff  Pulse 66   Temp 97.7 F (36.5 C) (Oral)   Resp (!) 22   SpO2 98%   Visual Acuity Right Eye Distance:   Left Eye Distance:   Bilateral Distance:    Right Eye Near:   Left Eye Near:    Bilateral Near:     Physical Exam Gen: NAD, alert, cooperative with exam, well-appearing Psych:  normal insight, alert and oriented MSK:  Neck: Limited extension. Tender to palpation over the paraspinal cervical neck muscles. Normal range of motion and lateral rotation. Normal neck flexion. Normal strength resistance with shrug. Normal grip strength. Neurovascular intact   UC Treatments / Results  Labs (all labs ordered are listed, but only abnormal results are displayed) Labs Reviewed - No data to display  EKG   Radiology No results found.  Procedures Procedures (including critical care  time)  Medications Ordered in UC Medications - No data to display  Initial Impression / Assessment and Plan / UC Course  I have reviewed the triage vital signs and the nursing notes.  Pertinent labs & imaging results that were available during my care of the patient were reviewed by me and considered in my medical decision making (see chart for details).     Eric Schultz is a 80 year old male that is presenting with right-sided neck pain.  Seems to be muscular in nature.  Will provide prednisone and baclofen to use as needed.  Counseled on supportive care and home exercise therapy.  Given indications to follow-up.  Final Clinical Impressions(s) / UC Diagnoses   Final diagnoses:  Acute strain of neck muscle, initial encounter     Discharge Instructions     Please try heat  Please try range of motion of the neck  The muscle relaxer can make you sleepy. Please try it at night initially  Please follow up if your symptoms fail to improve.     ED Prescriptions    Medication Sig Dispense Auth. Provider   predniSONE (DELTASONE) 5 MG tablet Take 6 pills for first day, 5 pills second day, 4 pills third day, 3 pills fourth day, 2 pills the fifth day, and 1 pill sixth day. 21 tablet Rosemarie Ax, MD   baclofen (LIORESAL) 10 MG tablet Take 0.5 tablets (5 mg total) by mouth 2 (two) times daily as needed for muscle spasms. 20 each Rosemarie Ax, MD     PDMP not reviewed this encounter.   Rosemarie Ax, MD 03/01/20 [phone]

## 2020-03-01 NOTE — ED Triage Notes (Signed)
Neck pain that started 3 weeks ago.  No known injury.

## 2020-03-01 NOTE — Telephone Encounter (Signed)
Attempted to reach patient, rang at least 10 times no answer and no voice mail set up.

## 2020-03-04 ENCOUNTER — Other Ambulatory Visit: Payer: Self-pay

## 2020-03-04 ENCOUNTER — Ambulatory Visit (HOSPITAL_COMMUNITY)
Admission: EM | Admit: 2020-03-04 | Discharge: 2020-03-04 | Disposition: A | Discharge status: Home / Self Care | Payer: Medicare PPO | Attending: Family Medicine | Admitting: Family Medicine

## 2020-03-04 ENCOUNTER — Encounter (HOSPITAL_COMMUNITY): Payer: Self-pay

## 2020-03-04 DIAGNOSIS — L03116 Cellulitis of left lower limb: Secondary | ICD-10-CM

## 2020-03-04 DIAGNOSIS — I872 Venous insufficiency (chronic) (peripheral): Secondary | ICD-10-CM

## 2020-03-04 MED ORDER — DOXYCYCLINE HYCLATE 100 MG PO CAPS
100.0000 mg | ORAL_CAPSULE | Freq: Two times a day (BID) | ORAL | 0 refills | Status: DC
Start: 2020-03-04 — End: 2020-04-28

## 2020-03-04 NOTE — ED Provider Notes (Signed)
[place]   [id number] 03/04/20 Arrival Time: 0321  CC: RASH  SUBJECTIVE:  OTHAR CURTO is a 79 y.o. male who presents with a skin complaint that began about 2 weeks ago.he has a history of chronic venous stasis dermatitis, lower leg cellulitis, gout, acute kidney injury, hypertension.  Does not take his medications as ordered.  Denies precipitating event or trauma.  Denies changes in soaps, detergents, close contacts with similar rash, known trigger or environmental trigger, allergy. Denies medications change or starting a new medication recently.  Localizes the rash to soles of both feet.  Describes it as painful, red, swollen, draining.  Has made no attempts to treat at home.  There are no aggravating or alleviating factors.  Denies fever, chills, nausea, vomiting,  oral lesions, SOB, chest pain, abdominal pain, changes in bowel or bladder function.    ROS: As per HPI.  All other pertinent ROS negative.     Past Medical History:  Diagnosis Date  . Allergy   . Cellulitis    left lower leg  . Gout   . HOH (hard of hearing)   . Hypertension    "borderline"  . Sleep apnea    History reviewed. No pertinent surgical history. Allergies  Allergen Reactions  . Sulfa Antibiotics Nausea And Vomiting   No current facility-administered medications on file prior to encounter.   Current Outpatient Medications on File Prior to Encounter  Medication Sig Dispense Refill  . baclofen (LIORESAL) 10 MG tablet Take 0.5 tablets (5 mg total) by mouth 2 (two) times daily as needed for muscle spasms. 20 each 0  . predniSONE (DELTASONE) 5 MG tablet Take 6 pills for first day, 5 pills second day, 4 pills third day, 3 pills fourth day, 2 pills the fifth day, and 1 pill sixth day. 21 tablet 0  . [DISCONTINUED] furosemide (LASIX) 20 MG tablet Take 1 tablet (20 mg total) by mouth daily. (please follow up with your PCP to check labs and weight within 1 week) 30 tablet 0  . [DISCONTINUED]  potassium chloride SA (K-DUR) 20 MEQ tablet Take 1 tablet (20 mEq total) by mouth daily for 14 days. (follow up with your PCP for labs within 1 week) 14 tablet 0   Social History   Socioeconomic History  . Marital status: Single    Spouse name: Not on file  . Number of children: 0  . Years of education: Not on file  . Highest education level: Not on file  Occupational History  . Occupation: retired Barista   Tobacco Use  . Smoking status: Never Smoker  . Smokeless tobacco: Never Used  Vaping Use  . Vaping Use: Never used  Substance and Sexual Activity  . Alcohol use: No  . Drug use: No  . Sexual activity: Not on file  Other Topics Concern  . Not on file  Social History Narrative   Marital status: single      Children: none      Employment: retired;       Tobacco: none      Alcohol: none         Social Determinants of Radio broadcast assistant Strain:   . Difficulty of Paying Living Expenses:   Food Insecurity:   . Worried About Charity fundraiser in the Last Year:   . Arboriculturist in the Last Year:   Transportation Needs:   . Film/video editor (Medical):   Marland Kitchen Lack  of Transportation (Non-Medical):   Physical Activity:   . Days of Exercise per Week:   . Minutes of Exercise per Session:   Stress:   . Feeling of Stress :   Social Connections:   . Frequency of Communication with Friends and Family:   . Frequency of Social Gatherings with Friends and Family:   . Attends Religious Services:   . Active Member of Clubs or Organizations:   . Attends Archivist Meetings:   Marland Kitchen Marital Status:   Intimate Partner Violence:   . Fear of Current or Ex-Partner:   . Emotionally Abused:   Marland Kitchen Physically Abused:   . Sexually Abused:    Family History  Problem Relation Age of Onset  . Heart failure Mother   . Heart failure Father   . Stroke Father   . Colon cancer Neg Hx   . Esophageal cancer Neg Hx   . Rectal cancer Neg Hx   . Stomach cancer Neg  Hx     OBJECTIVE: Vitals:   03/04/20 1525  BP: (!) 153/71  Pulse: 88  Resp: 16  Temp: (!) 97.5 F (36.4 C)  TempSrc: Oral  SpO2: 100%    General appearance: alert; no distress Head: NCAT Lungs: clear to auscultation bilaterally Heart: regular rate and rhythm.  Radial pulse 2+ bilaterally Extremities: no edema Skin: warm and dry; bilateral lower extremities are erythematous and edematous.  The soles of both feet are oozing yellow-green fluid, no traumatic area noted Psychological: alert and cooperative; normal mood and affect  ASSESSMENT & PLAN:  1. Cellulitis of left lower extremity   2. Chronic venous stasis dermatitis of both lower extremities     Meds ordered this encounter  Medications  . doxycycline (VIBRAMYCIN) 100 MG capsule    Sig: Take 1 capsule (100 mg total) by mouth 2 (two) times daily.    Dispense:  14 capsule    Refill:  0    Order Specific Question:   Supervising Provider    Answer:   Chase Picket [[id number]]     Prescribe doxycycline  take as prescribed and to completion Avoid hot showers/ baths Moisturize skin daily  Follow up with PCP if symptoms persists Return or go to the ER if you have any new or worsening symptoms such as fever, chills, nausea, vomiting, redness, swelling, discharge, if symptoms do not improve with medications  Reviewed expectations re: course of current medical issues. Questions answered. Outlined signs and symptoms indicating need for more acute intervention. Patient verbalized understanding. After Visit Summary given.   Faustino Congress, NP 03/06/20 [phone]

## 2020-03-04 NOTE — ED Triage Notes (Signed)
Pt presents to UC for possible infection in bilateral feet. Pt states he has redness and swelling in both feet. Pt denies pain. Pt endorsees clear drainage from bottom of feet. Pt denies fever, chills, body aches, N/V/D.

## 2020-03-04 NOTE — Discharge Instructions (Signed)
You have a skin infection of your left foot   I have sent in doxycycline for you to take twice daily for 7 days  Take this medication with food as it can upset your stomach  Follow up as needed

## 2020-03-17 ENCOUNTER — Other Ambulatory Visit: Payer: Self-pay

## 2020-03-17 ENCOUNTER — Ambulatory Visit
Admission: EM | Admit: 2020-03-17 | Discharge: 2020-03-17 | Discharge status: Against Medical Advice | Payer: Medicare PPO

## 2020-03-17 NOTE — ED Triage Notes (Signed)
Pt requesting to only be seen by a MD.  He was advised that we have a PA here on campus and patient decided to leave.

## 2020-04-27 ENCOUNTER — Emergency Department (HOSPITAL_COMMUNITY)
Admission: EM | Admit: 2020-04-27 | Discharge: 2020-04-28 | Disposition: A | Discharge status: Home / Self Care | Payer: No Typology Code available for payment source | Attending: Emergency Medicine | Admitting: Emergency Medicine

## 2020-04-27 ENCOUNTER — Encounter (HOSPITAL_COMMUNITY): Payer: Self-pay | Admitting: *Deleted

## 2020-04-27 ENCOUNTER — Other Ambulatory Visit: Payer: Self-pay

## 2020-04-27 DIAGNOSIS — I10 Essential (primary) hypertension: Secondary | ICD-10-CM | POA: Insufficient documentation

## 2020-04-27 DIAGNOSIS — I872 Venous insufficiency (chronic) (peripheral): Secondary | ICD-10-CM | POA: Insufficient documentation

## 2020-04-27 DIAGNOSIS — E876 Hypokalemia: Secondary | ICD-10-CM

## 2020-04-27 DIAGNOSIS — I878 Other specified disorders of veins: Secondary | ICD-10-CM

## 2020-04-27 DIAGNOSIS — Z79899 Other long term (current) drug therapy: Secondary | ICD-10-CM | POA: Diagnosis not present

## 2020-04-27 DIAGNOSIS — L03116 Cellulitis of left lower limb: Secondary | ICD-10-CM | POA: Insufficient documentation

## 2020-04-27 DIAGNOSIS — M79605 Pain in left leg: Secondary | ICD-10-CM | POA: Diagnosis present

## 2020-04-27 LAB — COMPREHENSIVE METABOLIC PANEL
ALT: 16 U/L (ref 0–44)
AST: 24 U/L (ref 15–41)
Albumin: 3 g/dL — ABNORMAL LOW (ref 3.5–5.0)
Alkaline Phosphatase: 88 U/L (ref 38–126)
Anion gap: 7 (ref 5–15)
BUN: 9 mg/dL (ref 8–23)
CO2: 26 mmol/L (ref 22–32)
Calcium: 8.5 mg/dL — ABNORMAL LOW (ref 8.9–10.3)
Chloride: 108 mmol/L (ref 98–111)
Creatinine, Ser: 0.95 mg/dL (ref 0.61–1.24)
GFR calc Af Amer: >60 mL/min (ref >60)
GFR calc non Af Amer: >60 mL/min (ref >60)
Glucose, Bld: 95 mg/dL (ref 70–99)
Potassium: 3.2 mmol/L — ABNORMAL LOW (ref 3.5–5.1)
Sodium: 141 mmol/L (ref 135–145)
Total Bilirubin: 1 mg/dL (ref 0.3–1.2)
Total Protein: 7.1 g/dL (ref 6.5–8.1)

## 2020-04-27 LAB — CBC WITH DIFFERENTIAL/PLATELET
Abs Immature Granulocytes: 0.02 10*3/uL (ref 0.00–0.07)
Basophils Absolute: 0 10*3/uL (ref 0.0–0.1)
Basophils Relative: 1 %
Eosinophils Absolute: 0.2 10*3/uL (ref 0.0–0.5)
Eosinophils Relative: 5 %
HCT: 33.8 % — ABNORMAL LOW (ref 39.0–52.0)
Hemoglobin: 11.2 g/dL — ABNORMAL LOW (ref 13.0–17.0)
Immature Granulocytes: 1 %
Lymphocytes Relative: 23 %
Lymphs Abs: 0.9 10*3/uL (ref 0.7–4.0)
MCH: 32.7 pg (ref 26.0–34.0)
MCHC: 33.1 g/dL (ref 30.0–36.0)
MCV: 98.5 fL (ref 80.0–100.0)
Monocytes Absolute: 0.5 10*3/uL (ref 0.1–1.0)
Monocytes Relative: 12 %
Neutro Abs: 2.3 10*3/uL (ref 1.7–7.7)
Neutrophils Relative %: 58 %
Platelets: 111 10*3/uL — ABNORMAL LOW (ref 150–400)
RBC: 3.43 MIL/uL — ABNORMAL LOW (ref 4.22–5.81)
RDW: 14.1 % (ref 11.5–15.5)
WBC: 3.9 10*3/uL — ABNORMAL LOW (ref 4.0–10.5)
nRBC: 0 % (ref 0.0–0.2)

## 2020-04-27 NOTE — ED Triage Notes (Signed)
Pt reports ongoing pain and swelling to left lower leg x 1 month. Was sent today for treatment for cellulitis. Denies fever. No acute distress is noted at this time.

## 2020-04-28 ENCOUNTER — Emergency Department (HOSPITAL_BASED_OUTPATIENT_CLINIC_OR_DEPARTMENT_OTHER): Payer: No Typology Code available for payment source

## 2020-04-28 DIAGNOSIS — L538 Other specified erythematous conditions: Secondary | ICD-10-CM | POA: Diagnosis not present

## 2020-04-28 DIAGNOSIS — M7989 Other specified soft tissue disorders: Secondary | ICD-10-CM

## 2020-04-28 DIAGNOSIS — R52 Pain, unspecified: Secondary | ICD-10-CM

## 2020-04-28 LAB — LACTIC ACID, PLASMA: Lactic Acid, Venous: 1.3 mmol/L (ref 0.5–1.9)

## 2020-04-28 MED ORDER — DOXYCYCLINE HYCLATE 100 MG PO CAPS: 100.0000 mg | ORAL_CAPSULE | Freq: Two times a day (BID) | ORAL | 0 refills | Status: DC

## 2020-04-28 MED ORDER — POTASSIUM CHLORIDE CRYS ER 20 MEQ PO TBCR
40.0000 meq | EXTENDED_RELEASE_TABLET | Freq: Once | ORAL | Status: AC
  Administered 2020-04-28: 40 meq via ORAL
  Filled 2020-04-28: qty 2

## 2020-04-28 MED ORDER — POTASSIUM CHLORIDE CRYS ER 20 MEQ PO TBCR: 20.0000 meq | EXTENDED_RELEASE_TABLET | Freq: Every day | ORAL | 0 refills | Status: AC

## 2020-04-28 MED ORDER — CLINDAMYCIN PHOSPHATE 600 MG/50ML IV SOLN
600.0000 mg | Freq: Once | INTRAVENOUS | Status: AC
  Administered 2020-04-28: 600 mg via INTRAVENOUS
  Filled 2020-04-28: qty 50

## 2020-04-28 MED ORDER — FUROSEMIDE 10 MG/ML IJ SOLN
80.0000 mg | Freq: Once | INTRAMUSCULAR | Status: AC
  Administered 2020-04-28: 80 mg via INTRAVENOUS
  Filled 2020-04-28: qty 8

## 2020-04-28 NOTE — ED Notes (Signed)
DC instructions reviewed with pt. Pt verbalized understanding.  Pt DC 

## 2020-04-28 NOTE — ED Provider Notes (Signed)
[facility] DEPARTMENT Provider Note   CSN: [id number] Arrival date & time: 04/27/20  1426     History Chief Complaint  Patient presents with  . Leg Pain    Eric Schultz is a 79 y.o. male.  Patient is a 78 year old male who has a history of chronic venous stasis to his lower extremities with his left leg being greater than his right.  He has had some recurrent episodes of cellulitis in his left leg.  He reports about a week history of some worsening swelling and redness in his left leg as compared to his right.  He denies any shortness of breath.  No fevers.  No nausea or vomiting.  No other recent illnesses.  In the past, he has been noncompliant with his Lasix and compression stockings.  He says his leg is been oozing some clear fluid.        Past Medical History:  Diagnosis Date  . Allergy   . Cellulitis    left lower leg  . Gout   . HOH (hard of hearing)   . Hypertension    "borderline"  . Sleep apnea     Patient Active Problem List   Diagnosis Date Noted  . Rectal adenocarcinoma ([place]) 02/29/2020  . Cellulitis of foot, left 03/21/2019  . Chronic venous stasis dermatitis of both lower extremities 03/21/2019  . AKI (acute kidney injury) ([place]) 03/21/2019  . Acute on chronic respiratory failure with hypoxia ([place]) 03/21/2019    History reviewed. No pertinent surgical history.     Family History  Problem Relation Age of Onset  . Heart failure Mother   . Heart failure Father   . Stroke Father   . Colon cancer Neg Hx   . Esophageal cancer Neg Hx   . Rectal cancer Neg Hx   . Stomach cancer Neg Hx     Social History   Tobacco Use  . Smoking status: Never Smoker  . Smokeless tobacco: Never Used  Vaping Use  . Vaping Use: Never used  Substance Use Topics  . Alcohol use: No  . Drug use: No    Home Medications Prior to Admission medications   Medication Sig Start Date End Date Taking? Authorizing Provider  IBUPROFEN PO Take  by mouth 4 (four) times daily as needed (pain).   Yes [provider]  baclofen (LIORESAL) 10 MG tablet Take 0.5 tablets (5 mg total) by mouth 2 (two) times daily as needed for muscle spasms. Patient not taking: Reported on 04/28/2020 03/01/20   Rosemarie Ax, MD  doxycycline (VIBRAMYCIN) 100 MG capsule Take 1 capsule (100 mg total) by mouth 2 (two) times daily. Patient not taking: Reported on 04/28/2020 03/04/20   Faustino Congress, NP  predniSONE (DELTASONE) 5 MG tablet Take 6 pills for first day, 5 pills second day, 4 pills third day, 3 pills fourth day, 2 pills the fifth day, and 1 pill sixth day. Patient not taking: Reported on 04/28/2020 03/01/20   Rosemarie Ax, MD  furosemide (LASIX) 20 MG tablet Take 1 tablet (20 mg total) by mouth daily. (please follow up with your PCP to check labs and weight within 1 week) 03/25/19 09/29/19  Elodia Florence., MD  potassium chloride SA (K-DUR) 20 MEQ tablet Take 1 tablet (20 mEq total) by mouth daily for 14 days. (follow up with your PCP for labs within 1 week) 03/25/19 09/29/19  Elodia Florence., MD    Allergies  Sulfa antibiotics  Review of Systems   Review of Systems  Constitutional: Negative for chills, diaphoresis, fatigue and fever.  HENT: Negative for congestion, rhinorrhea and sneezing.   Eyes: Negative.   Respiratory: Negative for cough, chest tightness and shortness of breath.   Cardiovascular: Positive for leg swelling. Negative for chest pain.  Gastrointestinal: Negative for abdominal pain, blood in stool, diarrhea, nausea and vomiting.  Genitourinary: Negative for difficulty urinating, flank pain, frequency and hematuria.  Musculoskeletal: Negative for arthralgias and back pain.  Skin: Negative for rash.  Neurological: Negative for dizziness, speech difficulty, weakness, numbness and headaches.    Physical Exam Updated Vital Signs BP (!) 175/76 (BP Location: Right Arm)   Pulse (!) 56   Temp 98.1 F (36.7 C)  (Oral)   Resp 16   Ht 5\' 11"  (1.803 m)   Wt 136.1 kg   SpO2 99%   BMI 41.84 kg/m   Physical Exam Constitutional:      Appearance: He is well-developed.  HENT:     Head: Normocephalic and atraumatic.  Eyes:     Pupils: Pupils are equal, round, and reactive to light.  Cardiovascular:     Rate and Rhythm: Normal rate and regular rhythm.     Heart sounds: Normal heart sounds.  Pulmonary:     Effort: Pulmonary effort is normal. No respiratory distress.     Breath sounds: Normal breath sounds. No wheezing or rales.  Chest:     Chest wall: No tenderness.  Abdominal:     General: Bowel sounds are normal.     Palpations: Abdomen is soft.     Tenderness: There is no abdominal tenderness. There is no guarding or rebound.  Musculoskeletal:        General: Swelling and tenderness present. Normal range of motion.     Cervical back: Normal range of motion and neck supple.     Right lower leg: Edema present.     Left lower leg: Edema present.     Comments: Patient has swelling of the bilateral lower extremities with marked increase in swelling in his left lower extremity as compared to his right.  Pedal pulses are intact.  He has chronic skin changes related to venous stasis.  He has some warmth and erythema to his foot and lower leg on the left.  Lymphadenopathy:     Cervical: No cervical adenopathy.  Skin:    General: Skin is warm and dry.     Findings: No rash.  Neurological:     Mental Status: He is alert and oriented to person, place, and time.     ED Results / Procedures / Treatments   Labs (all labs ordered are listed, but only abnormal results are displayed) Labs Reviewed  COMPREHENSIVE METABOLIC PANEL - Abnormal; Notable for the following components:      Result Value   Potassium 3.2 (*)    Calcium 8.5 (*)    Albumin 3.0 (*)    All other components within normal limits  CBC WITH DIFFERENTIAL/PLATELET - Abnormal; Notable for the following components:   WBC 3.9 (*)     RBC 3.43 (*)    Hemoglobin 11.2 (*)    HCT 33.8 (*)    Platelets 111 (*)    All other components within normal limits  CULTURE, BLOOD (ROUTINE X 2)  CULTURE, BLOOD (ROUTINE X 2)  LACTIC ACID, PLASMA    EKG None  Radiology VAS [place] LOWER EXTREMITY VENOUS (DVT) (ONLY [facility] & [facility] 7a-7p)  Result Date: 04/28/2020  Lower Venous DVT Study Indications: Pain, Swelling, and Erythema.  Limitations: Body habitus and skin texture, pain with touch. Comparison       Prior negative study done 11/13/19 and is available for Study:           comparison. Performing Technologist: Sharion Dove RVS  Examination Guidelines: A complete evaluation includes B-mode imaging, spectral Doppler, color Doppler, and power Doppler as needed of all accessible portions of each vessel. Bilateral testing is considered an integral part of a complete examination. Limited examinations for reoccurring indications may be performed as noted. The reflux portion of the exam is performed with the patient in reverse Trendelenburg.  +-----+---------------+---------+-----------+----------+--------------+ RIGHTCompressibilityPhasicitySpontaneityPropertiesThrombus Aging +-----+---------------+---------+-----------+----------+--------------+ CFV  Full           Yes      Yes                                 +-----+---------------+---------+-----------+----------+--------------+   +---------+---------------+---------+-----------+----------+--------------+ LEFT     CompressibilityPhasicitySpontaneityPropertiesThrombus Aging +---------+---------------+---------+-----------+----------+--------------+ CFV      Full           Yes      Yes                                 +---------+---------------+---------+-----------+----------+--------------+ SFJ      Full                                                        +---------+---------------+---------+-----------+----------+--------------+ FV Prox  Full                                                         +---------+---------------+---------+-----------+----------+--------------+ FV Mid   Full                                                        +---------+---------------+---------+-----------+----------+--------------+ FV DistalFull           Yes      Yes                                 +---------+---------------+---------+-----------+----------+--------------+ PFV      Full                                                        +---------+---------------+---------+-----------+----------+--------------+ POP      Full           Yes      Yes                                 +---------+---------------+---------+-----------+----------+--------------+ PTV  Full           Yes      Yes                                 +---------+---------------+---------+-----------+----------+--------------+ PERO                                                  Not visualized +---------+---------------+---------+-----------+----------+--------------+     Summary: RIGHT: - No evidence of common femoral vein obstruction.  LEFT: - Findings appear essentially unchanged compared to previous examination. - There is no evidence of deep vein thrombosis in the lower extremity. However, portions of this examination were limited- see technologist comments above.  - Ultrasound characteristics of enlarged lymph nodes noted in the groin.  *See table(s) above for measurements and observations.    Preliminary     Procedures Procedures (including critical care time)  Medications Ordered in ED Medications  furosemide (LASIX) injection 80 mg (has no administration in time range)  potassium chloride SA (KLOR-CON) CR tablet 40 mEq (has no administration in time range)  clindamycin (CLEOCIN) IVPB 600 mg (600 mg Intravenous New Bag/Given 04/28/20 0347)    ED Course  I have reviewed the triage vital signs and the nursing notes.  Pertinent labs & imaging results that were  available during my care of the patient were reviewed by me and considered in my medical decision making (see chart for details).    MDM Rules/Calculators/A&P                          Patient is a 79 year old male who has chronic venous stasis of his lower extremities, more in the left chronically.  He reports with worsening swelling and discomfort to the leg consistent with prior episodes of cellulitis.  He has no fever.  His vital signs are stable.  No systemic signs of illness.  His lactate is normal.  His labs are reviewed and are nonconcerning.  He has some chronic neutropenia and thrombocytopenia which is unchanged.  His potassium is slightly low and he was given a dose of potassium in the ED.  He is noncompliant with his Lasix and compression stockings.  He was given dose of Lasix in the ED as well as a dose of clindamycin.  I discussed with him inpatient versus outpatient treatment and he wants to go home.  I offered to get him home health services set up but he is declining.  He says he has a normal routine and as long as he can take care of himself at home he does not need home health services to help him.  He is been seen a couple times in the past for cellulitis at urgent care and was treated with doxycycline.  He says this is a similar episode and it previously had gotten better with the doxycycline.  I will give him prescription for doxycycline and give him a trial of outpatient treatment although he was counseled to return if he has any worsening symptoms including fevers, difficulty with ambulation, worsening swelling or spreading erythema.  He did have an ultrasound the ED which shows no evidence of DVT but there is a reactive lymph node in his inguinal area.  He was discharged home in good  condition.  Strict return precautions were given. Final Clinical Impression(s) / ED Diagnoses Final diagnoses:  Venous stasis  Cellulitis of left lower extremity    Rx / DC Orders ED Discharge  Orders    None       Malvin Johns, MD 04/28/20 1009

## 2020-04-28 NOTE — Progress Notes (Signed)
VASCULAR LAB    Left lower extremity venous duplex completed.    Preliminary report:  See CV proc for preliminary results.  Messaged Dr. Tamera Punt with results.  Karter Haire, RVT 04/28/2020, 8:25 AM

## 2020-05-02 ENCOUNTER — Ambulatory Visit (HOSPITAL_COMMUNITY)
Admission: EM | Admit: 2020-05-02 | Discharge: 2020-05-02 | Disposition: A | Discharge status: Home / Self Care | Payer: Medicare PPO | Attending: Family Medicine | Admitting: Family Medicine

## 2020-05-02 ENCOUNTER — Encounter (HOSPITAL_COMMUNITY): Payer: Self-pay

## 2020-05-02 ENCOUNTER — Other Ambulatory Visit: Payer: Self-pay

## 2020-05-02 DIAGNOSIS — I872 Venous insufficiency (chronic) (peripheral): Secondary | ICD-10-CM

## 2020-05-02 DIAGNOSIS — M7989 Other specified soft tissue disorders: Secondary | ICD-10-CM

## 2020-05-02 MED ORDER — HYDROCODONE-ACETAMINOPHEN 5-325 MG PO TABS
1.0000 | ORAL_TABLET | Freq: Four times a day (QID) | ORAL | 0 refills | Status: DC | PRN
Start: 2020-05-02 — End: 2020-05-30

## 2020-05-02 NOTE — ED Provider Notes (Signed)
[facility]    CSN: [id number] Arrival date & time: 05/02/20  1051      History   Chief Complaint Chief Complaint  Patient presents with  . Cellulitis    HPI Eric Schultz is a 79 y.o. male.   Patient is a 54-year male with past medical history of cellulitis, gout, chronic venous stasis dermatitis of lower extremities, rectal adenocarcinoma.  He presents today with swelling to left lower extremity and bottom of left foot.  Patient currently on doxycycline.  Was seen in the ER where he was treated with Lasix and clindamycin and sent home with doxycycline prescription for cellulitis.  Feels like the infection is improving but still having lot of swelling more so in the left lower extremity.  He is requesting his legs to be wrapped and new shoes to help him walk.  Was offered help with setting up home health care in the ER but he declined. He is evidently non compliant with lasix. Takes ibuprofen for pain which helps some at times. Is also requesting something stronger for pain as needed.      Past Medical History:  Diagnosis Date  . Allergy   . Cellulitis    left lower leg  . Gout   . HOH (hard of hearing)   . Hypertension    "borderline"  . Sleep apnea     Patient Active Problem List   Diagnosis Date Noted  . 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 Cellulitis of foot, left 03/21/2019  . Chronic venous stasis dermatitis of both lower extremities 03/21/2019  . AKI (acute kidney injury) ([place]) 03/21/2019  . Acute on chronic respiratory failure with hypoxia ([place]) 03/21/2019    History reviewed. No pertinent surgical history.     Home Medications    Prior to Admission medications   Medication Sig Start Date End Date Taking? Authorizing Provider  doxycycline (VIBRAMYCIN) 100 MG capsule Take 1 capsule (100 mg total) by mouth 2 (two) times daily. 04/28/20   Malvin Johns, MD  HYDROcodone-acetaminophen (NORCO/VICODIN) 5-325 MG tablet Take 1 tablet by mouth  every 6 (six) hours as needed. 05/02/20   Loura Halt A, NP  IBUPROFEN PO Take by mouth 4 (four) times daily as needed (pain).    [provider]  potassium chloride SA (KLOR-CON) 20 MEQ tablet Take 1 tablet (20 mEq total) by mouth daily. 04/28/20   Malvin Johns, MD  furosemide (LASIX) 20 MG tablet Take 1 tablet (20 mg total) by mouth daily. (please follow up with your PCP to check labs and weight within 1 week) 03/25/19 09/29/19  Elodia Florence., MD    Family History Family History  Problem Relation Age of Onset  . Heart failure Mother   . Heart failure Father   . Stroke Father   . Colon cancer Neg Hx   . Esophageal cancer Neg Hx   . Rectal cancer Neg Hx   . Stomach cancer Neg Hx     Social History Social History   Tobacco Use  . Smoking status: Never Smoker  . Smokeless tobacco: Never Used  Vaping Use  . Vaping Use: Never used  Substance Use Topics  . Alcohol use: No  . Drug use: No     Allergies   Sulfa antibiotics   Review of Systems Review of Systems   Physical Exam Triage Vital Signs ED Triage Vitals  Enc Vitals Group     BP 05/02/20 1238 (!) 158/80     Pulse  Rate 05/02/20 1238 (!) 51     Resp 05/02/20 1238 (!) 21     Temp 05/02/20 1238 98 F (36.7 C)     Temp Source 05/02/20 1238 Oral     SpO2 05/02/20 1238 96 %     Weight --      Height --      Head Circumference --      Peak Flow --      Pain Score 05/02/20 1237 3     Pain Loc --      Pain Edu? --      Excl. in [place]? --    No data found.  Updated Vital Signs BP (!) 158/80 (BP Location: Left Arm)   Pulse (!) 51   Temp 98 F (36.7 C) (Oral)   Resp (!) 21   SpO2 96%   Visual Acuity Right Eye Distance:   Left Eye Distance:   Bilateral Distance:    Right Eye Near:   Left Eye Near:    Bilateral Near:     Physical Exam Vitals and nursing note reviewed.  Constitutional:      Appearance: Normal appearance.  HENT:     Head: Normocephalic and atraumatic.  Eyes:      Conjunctiva/sclera: Conjunctivae normal.  Pulmonary:     Effort: Pulmonary effort is normal.  Musculoskeletal:        General: Normal range of motion.     Cervical back: Normal range of motion.     Right lower leg: Edema present.     Left lower leg: Edema present.     Comments: Worse swelling in the LLE,mild erythema.  Chronic skin changes with venous insufficiency. No open wounds.   Skin:    General: Skin is warm and dry.  Neurological:     Mental Status: He is alert.  Psychiatric:        Mood and Affect: Mood normal.      UC Treatments / Results  Labs (all labs ordered are listed, but only abnormal results are displayed) Labs Reviewed - No data to display  EKG   Radiology No results found.  Procedures Procedures (including critical care time)  Medications Ordered in UC Medications - No data to display  Initial Impression / Assessment and Plan / UC Course  I have reviewed the triage vital signs and the nursing notes.  Pertinent labs & imaging results that were available during my care of the patient were reviewed by me and considered in my medical decision making (see chart for details).     Chronic venous insufficiency Unna boots applied here in clinic.  Ambulatory referral to wound clinic for follow up.  Could benefit from home health care and home wound care. Patient was offered help with this in this last ER visit but declined He needs to have the dressing change in 4 days.  Reinforced importance of this and follow-up with wound Keep the legs elevated for swelling Finish the doxycycline Ibuprofen for mild to moderate pain and hydrocodone for severe pain as needed. Follow up as needed for continued or worsening symptoms   Final Clinical Impressions(s) / UC Diagnoses   Final diagnoses:  Chronic venous insufficiency  Leg swelling     Discharge Instructions     You need to follow-up at the wound clinic for this chronic issue You could benefit from  home health care and wound care.  Keep wounds clean and wrapped Elevate the legs Continue the doxycycline that you are prescribed  in the ER. Hydrocodone for pain as needed Follow up as needed for continued or worsening symptoms     ED Prescriptions    Medication Sig Dispense Auth. Provider   HYDROcodone-acetaminophen (NORCO/VICODIN) 5-325 MG tablet Take 1 tablet by mouth every 6 (six) hours as needed. 10 tablet Selam Pietsch A, NP     I have reviewed the PDMP during this encounter.   Loura Halt A, NP 05/02/20 1503

## 2020-05-02 NOTE — ED Triage Notes (Signed)
Pt reports having swelling and cellulitis in the left leg and bottom of the left foot. Pt denies fever.

## 2020-05-02 NOTE — Discharge Instructions (Addendum)
You need to follow-up at the wound clinic for this chronic issue You could benefit from home health care and wound care.  Keep wounds clean and wrapped Elevate the legs Continue the doxycycline that you are prescribed in the ER. Hydrocodone for pain as needed Follow up as needed for continued or worsening symptoms

## 2020-05-03 LAB — CULTURE, BLOOD (ROUTINE X 2)
Culture: NO GROWTH
Culture: NO GROWTH
Special Requests: ADEQUATE

## 2020-05-06 ENCOUNTER — Encounter (HOSPITAL_COMMUNITY): Payer: Self-pay | Admitting: Emergency Medicine

## 2020-05-06 ENCOUNTER — Inpatient Hospital Stay (HOSPITAL_COMMUNITY)
Admission: EM | Admit: 2020-05-06 | Discharge: 2020-05-09 | DRG: 603 | Disposition: A | Discharge status: Home / Self Care | Payer: No Typology Code available for payment source | Attending: Internal Medicine | Admitting: Internal Medicine

## 2020-05-06 ENCOUNTER — Other Ambulatory Visit: Payer: Self-pay

## 2020-05-06 DIAGNOSIS — H9190 Unspecified hearing loss, unspecified ear: Secondary | ICD-10-CM | POA: Diagnosis present

## 2020-05-06 DIAGNOSIS — I872 Venous insufficiency (chronic) (peripheral): Secondary | ICD-10-CM | POA: Diagnosis present

## 2020-05-06 DIAGNOSIS — Z6841 Body Mass Index (BMI) 40.0 and over, adult: Secondary | ICD-10-CM

## 2020-05-06 DIAGNOSIS — Z20822 Contact with and (suspected) exposure to covid-19: Secondary | ICD-10-CM | POA: Diagnosis present

## 2020-05-06 DIAGNOSIS — D61818 Other pancytopenia: Secondary | ICD-10-CM | POA: Diagnosis present

## 2020-05-06 DIAGNOSIS — K7460 Unspecified cirrhosis of liver: Secondary | ICD-10-CM | POA: Diagnosis present

## 2020-05-06 DIAGNOSIS — Z79899 Other long term (current) drug therapy: Secondary | ICD-10-CM

## 2020-05-06 DIAGNOSIS — I110 Hypertensive heart disease with heart failure: Secondary | ICD-10-CM | POA: Diagnosis present

## 2020-05-06 DIAGNOSIS — I890 Lymphedema, not elsewhere classified: Secondary | ICD-10-CM | POA: Diagnosis present

## 2020-05-06 DIAGNOSIS — L03115 Cellulitis of right lower limb: Principal | ICD-10-CM | POA: Diagnosis present

## 2020-05-06 DIAGNOSIS — M109 Gout, unspecified: Secondary | ICD-10-CM | POA: Diagnosis present

## 2020-05-06 DIAGNOSIS — L03116 Cellulitis of left lower limb: Secondary | ICD-10-CM

## 2020-05-06 DIAGNOSIS — Z8249 Family history of ischemic heart disease and other diseases of the circulatory system: Secondary | ICD-10-CM

## 2020-05-06 DIAGNOSIS — L0390 Cellulitis, unspecified: Secondary | ICD-10-CM | POA: Diagnosis present

## 2020-05-06 DIAGNOSIS — I878 Other specified disorders of veins: Secondary | ICD-10-CM | POA: Diagnosis present

## 2020-05-06 DIAGNOSIS — G4733 Obstructive sleep apnea (adult) (pediatric): Secondary | ICD-10-CM | POA: Diagnosis present

## 2020-05-06 DIAGNOSIS — M7989 Other specified soft tissue disorders: Secondary | ICD-10-CM | POA: Diagnosis not present

## 2020-05-06 DIAGNOSIS — C20 Malignant neoplasm of rectum: Secondary | ICD-10-CM | POA: Diagnosis present

## 2020-05-06 DIAGNOSIS — I5032 Chronic diastolic (congestive) heart failure: Secondary | ICD-10-CM | POA: Diagnosis present

## 2020-05-06 DIAGNOSIS — Z882 Allergy status to sulfonamides status: Secondary | ICD-10-CM

## 2020-05-06 HISTORY — DX: Malignant neoplasm of rectum: C20

## 2020-05-06 NOTE — ED Triage Notes (Signed)
Pt states "I was told to come back in 4 days to have my legs rewrapped.  Pt reports he thinks they are getting better but might have a new sore on the bottom of his foot.

## 2020-05-07 ENCOUNTER — Encounter (HOSPITAL_COMMUNITY): Payer: Self-pay | Admitting: Internal Medicine

## 2020-05-07 DIAGNOSIS — L03116 Cellulitis of left lower limb: Secondary | ICD-10-CM | POA: Diagnosis present

## 2020-05-07 DIAGNOSIS — M7989 Other specified soft tissue disorders: Secondary | ICD-10-CM | POA: Diagnosis present

## 2020-05-07 DIAGNOSIS — I878 Other specified disorders of veins: Secondary | ICD-10-CM | POA: Diagnosis present

## 2020-05-07 DIAGNOSIS — I872 Venous insufficiency (chronic) (peripheral): Secondary | ICD-10-CM | POA: Diagnosis present

## 2020-05-07 DIAGNOSIS — C20 Malignant neoplasm of rectum: Secondary | ICD-10-CM | POA: Diagnosis present

## 2020-05-07 DIAGNOSIS — L03115 Cellulitis of right lower limb: Secondary | ICD-10-CM | POA: Diagnosis present

## 2020-05-07 DIAGNOSIS — I5032 Chronic diastolic (congestive) heart failure: Secondary | ICD-10-CM | POA: Diagnosis present

## 2020-05-07 DIAGNOSIS — G4733 Obstructive sleep apnea (adult) (pediatric): Secondary | ICD-10-CM | POA: Diagnosis present

## 2020-05-07 DIAGNOSIS — L0390 Cellulitis, unspecified: Secondary | ICD-10-CM | POA: Diagnosis present

## 2020-05-07 DIAGNOSIS — M109 Gout, unspecified: Secondary | ICD-10-CM | POA: Diagnosis present

## 2020-05-07 DIAGNOSIS — Z20822 Contact with and (suspected) exposure to covid-19: Secondary | ICD-10-CM | POA: Diagnosis present

## 2020-05-07 DIAGNOSIS — H9190 Unspecified hearing loss, unspecified ear: Secondary | ICD-10-CM | POA: Diagnosis present

## 2020-05-07 DIAGNOSIS — I110 Hypertensive heart disease with heart failure: Secondary | ICD-10-CM | POA: Diagnosis present

## 2020-05-07 DIAGNOSIS — K7460 Unspecified cirrhosis of liver: Secondary | ICD-10-CM | POA: Diagnosis present

## 2020-05-07 DIAGNOSIS — D61818 Other pancytopenia: Secondary | ICD-10-CM | POA: Diagnosis present

## 2020-05-07 DIAGNOSIS — Z6841 Body Mass Index (BMI) 40.0 and over, adult: Secondary | ICD-10-CM | POA: Diagnosis not present

## 2020-05-07 DIAGNOSIS — Z8249 Family history of ischemic heart disease and other diseases of the circulatory system: Secondary | ICD-10-CM | POA: Diagnosis not present

## 2020-05-07 DIAGNOSIS — Z882 Allergy status to sulfonamides status: Secondary | ICD-10-CM | POA: Diagnosis not present

## 2020-05-07 DIAGNOSIS — Z79899 Other long term (current) drug therapy: Secondary | ICD-10-CM | POA: Diagnosis not present

## 2020-05-07 DIAGNOSIS — I890 Lymphedema, not elsewhere classified: Secondary | ICD-10-CM | POA: Diagnosis present

## 2020-05-07 LAB — CBC WITH DIFFERENTIAL/PLATELET
Abs Immature Granulocytes: 0.02 10*3/uL (ref 0.00–0.07)
Basophils Absolute: 0 10*3/uL (ref 0.0–0.1)
Basophils Relative: 1 %
Eosinophils Absolute: 0.2 10*3/uL (ref 0.0–0.5)
Eosinophils Relative: 7 %
HCT: 34.3 % — ABNORMAL LOW (ref 39.0–52.0)
Hemoglobin: 11.5 g/dL — ABNORMAL LOW (ref 13.0–17.0)
Immature Granulocytes: 1 %
Lymphocytes Relative: 24 %
Lymphs Abs: 0.8 10*3/uL (ref 0.7–4.0)
MCH: 33.5 pg (ref 26.0–34.0)
MCHC: 33.5 g/dL (ref 30.0–36.0)
MCV: 100 fL (ref 80.0–100.0)
Monocytes Absolute: 0.4 10*3/uL (ref 0.1–1.0)
Monocytes Relative: 12 %
Neutro Abs: 2 10*3/uL (ref 1.7–7.7)
Neutrophils Relative %: 55 %
Platelets: 94 10*3/uL — ABNORMAL LOW (ref 150–400)
RBC: 3.43 MIL/uL — ABNORMAL LOW (ref 4.22–5.81)
RDW: 14.1 % (ref 11.5–15.5)
WBC: 3.5 10*3/uL — ABNORMAL LOW (ref 4.0–10.5)
nRBC: 0 % (ref 0.0–0.2)

## 2020-05-07 LAB — BASIC METABOLIC PANEL
Anion gap: 7 (ref 5–15)
BUN: 11 mg/dL (ref 8–23)
CO2: 26 mmol/L (ref 22–32)
Calcium: 8.5 mg/dL — ABNORMAL LOW (ref 8.9–10.3)
Chloride: 108 mmol/L (ref 98–111)
Creatinine, Ser: 0.91 mg/dL (ref 0.61–1.24)
GFR calc Af Amer: >60 mL/min (ref >60)
GFR calc non Af Amer: >60 mL/min (ref >60)
Glucose, Bld: 94 mg/dL (ref 70–99)
Potassium: 3.5 mmol/L (ref 3.5–5.1)
Sodium: 141 mmol/L (ref 135–145)

## 2020-05-07 LAB — BRAIN NATRIURETIC PEPTIDE: B Natriuretic Peptide: 82.2 pg/mL (ref 0.0–100.0)

## 2020-05-07 LAB — SARS CORONAVIRUS 2 BY RT PCR (HOSPITAL ORDER, PERFORMED IN ~~LOC~~ HOSPITAL LAB): SARS Coronavirus 2: NEGATIVE

## 2020-05-07 MED ORDER — HYDROCODONE-ACETAMINOPHEN 5-325 MG PO TABS
1.0000 | ORAL_TABLET | Freq: Four times a day (QID) | ORAL | Status: DC | PRN
  Administered 2020-05-08: 1 via ORAL
  Filled 2020-05-07: qty 1

## 2020-05-07 MED ORDER — ALBUTEROL SULFATE (2.5 MG/3ML) 0.083% IN NEBU: 2.5000 mg | INHALATION_SOLUTION | Freq: Four times a day (QID) | RESPIRATORY_TRACT | Status: DC | PRN

## 2020-05-07 MED ORDER — VANCOMYCIN HCL 1250 MG/250ML IV SOLN
1250.0000 mg | Freq: Two times a day (BID) | INTRAVENOUS | Status: DC
  Administered 2020-05-07 – 2020-05-09 (×4): 1250 mg via INTRAVENOUS
  Filled 2020-05-07 (×4): qty 250

## 2020-05-07 MED ORDER — VANCOMYCIN HCL 2000 MG/400ML IV SOLN
2000.0000 mg | Freq: Once | INTRAVENOUS | Status: AC
  Administered 2020-05-07: 2000 mg via INTRAVENOUS
  Filled 2020-05-07: qty 400

## 2020-05-07 MED ORDER — ACETAMINOPHEN 325 MG PO TABS: 650.0000 mg | ORAL_TABLET | Freq: Four times a day (QID) | ORAL | Status: DC | PRN

## 2020-05-07 MED ORDER — ONDANSETRON HCL 4 MG PO TABS: 4.0000 mg | ORAL_TABLET | Freq: Four times a day (QID) | ORAL | Status: DC | PRN

## 2020-05-07 MED ORDER — POTASSIUM CHLORIDE CRYS ER 20 MEQ PO TBCR
20.0000 meq | EXTENDED_RELEASE_TABLET | Freq: Every day | ORAL | Status: DC
  Administered 2020-05-07: 20 meq via ORAL
  Filled 2020-05-07: qty 1

## 2020-05-07 MED ORDER — ONDANSETRON HCL 4 MG/2ML IJ SOLN: 4.0000 mg | Freq: Four times a day (QID) | INTRAMUSCULAR | Status: DC | PRN

## 2020-05-07 MED ORDER — ACETAMINOPHEN 650 MG RE SUPP: 650.0000 mg | Freq: Four times a day (QID) | RECTAL | Status: DC | PRN

## 2020-05-07 NOTE — Progress Notes (Signed)
Pharmacy Antibiotic Note  Eric Schultz is a 79 y.o. male admitted on 05/06/2020 with cellulitis.  Pharmacy has been consulted for Vancomycin  Dosing.  Vancomycin 2 g IV given in ED at [facility]: Vancomycin 1250 mg IV q12h  Height: 5\' 11"  (180.3 cm) Weight: (!) 136.1 kg (300 lb 0.7 oz) IBW/kg (Calculated) : 75.3  Temp (24hrs), Avg:98.5 F (36.9 C), Min:98.3 F (36.8 C), Max:98.7 F (37.1 C)  No results for input(s): WBC, CREATININE, LATICACIDVEN, VANCOTROUGH, VANCOPEAK, VANCORANDOM, GENTTROUGH, GENTPEAK, GENTRANDOM, TOBRATROUGH, TOBRAPEAK, TOBRARND, AMIKACINPEAK, AMIKACINTROU, AMIKACIN in the last 168 hours.  Estimated Creatinine Clearance: 90.3 mL/min (by C-G formula based on SCr of 0.95 mg/dL).    Allergies  Allergen Reactions  . Sulfa Antibiotics Nausea And Vomiting    [name] 05/07/2020 6:52 AM

## 2020-05-07 NOTE — ED Provider Notes (Signed)
[facility] EMERGENCY DEPARTMENT Provider Note   CSN: [id number] Arrival date & time: 05/06/20  1911     History Chief Complaint  Patient presents with  . Wound Check    Eric Schultz is a 79 y.o. male.  Patient presents to the emergency department with a chief complaint of bilateral leg pain.  He has history of lymphedema and cellulitis.  He was seen about 1 week ago and given a run of IV antibiotic and discharged with doxycycline.  He states that he is continued to have pain and discharge/weeping from his left leg.  He feels like the pain has been worsening.  He denies any fevers.  He was seen at urgent care a few days ago and was placed in [employer].  He originally requested getting his Unna boots replaced tonight.  The history is provided by the patient. No language interpreter was used.       Past Medical History:  Diagnosis Date  . Allergy   . Cellulitis    left lower leg  . Gout   . HOH (hard of hearing)   . Hypertension    "borderline"  . Sleep apnea     Patient Active Problem List   Diagnosis Date Noted  . 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 Cellulitis of foot, left 03/21/2019  . Chronic venous stasis dermatitis of both lower extremities 03/21/2019  . AKI (acute kidney injury) ([place]) 03/21/2019  . Acute on chronic respiratory failure with hypoxia ([place]) 03/21/2019    History reviewed. No pertinent surgical history.     Family History  Problem Relation Age of Onset  . Heart failure Mother   . Heart failure Father   . Stroke Father   . Colon cancer Neg Hx   . Esophageal cancer Neg Hx   . Rectal cancer Neg Hx   . Stomach cancer Neg Hx     Social History   Tobacco Use  . Smoking status: Never Smoker  . Smokeless tobacco: Never Used  Vaping Use  . Vaping Use: Never used  Substance Use Topics  . Alcohol use: No  . Drug use: No    Home Medications Prior to Admission medications   Medication Sig Start Date End Date  Taking? Authorizing Provider  doxycycline (VIBRAMYCIN) 100 MG capsule Take 1 capsule (100 mg total) by mouth 2 (two) times daily. 04/28/20   Malvin Johns, MD  HYDROcodone-acetaminophen (NORCO/VICODIN) 5-325 MG tablet Take 1 tablet by mouth every 6 (six) hours as needed. 05/02/20   Loura Halt A, NP  IBUPROFEN PO Take by mouth 4 (four) times daily as needed (pain).    [provider]  potassium chloride SA (KLOR-CON) 20 MEQ tablet Take 1 tablet (20 mEq total) by mouth daily. 04/28/20   Malvin Johns, MD  furosemide (LASIX) 20 MG tablet Take 1 tablet (20 mg total) by mouth daily. (please follow up with your PCP to check labs and weight within 1 week) 03/25/19 09/29/19  Elodia Florence., MD    Allergies    Sulfa antibiotics  Review of Systems   Review of Systems  All other systems reviewed and are negative.   Physical Exam Updated Vital Signs BP (!) 163/69   Pulse (!) 55   Temp 98.3 F (36.8 C)   Resp 18   Ht 5\' 11"  (1.803 m)   Wt (!) 136.1 kg   SpO2 100%   BMI 41.85 kg/m   Physical Exam Vitals and nursing note  reviewed.  Constitutional:      General: He is not in acute distress.    Appearance: He is well-developed. He is not ill-appearing.  HENT:     Head: Normocephalic and atraumatic.  Eyes:     Conjunctiva/sclera: Conjunctivae normal.  Cardiovascular:     Rate and Rhythm: Normal rate.  Pulmonary:     Effort: Pulmonary effort is normal. No respiratory distress.  Abdominal:     General: There is no distension.  Musculoskeletal:     Cervical back: Neck supple.     Right lower leg: Edema present.     Left lower leg: Edema present.     Comments: Significant bilateral leg swelling L>R   Skin:    General: Skin is warm and dry.     Comments: There is significant redness about the left lateral ankle concerning for persistent cellulitis  Neurological:     Mental Status: He is alert and oriented to person, place, and time.  Psychiatric:        Mood and Affect:  Mood normal.        Behavior: Behavior normal.     ED Results / Procedures / Treatments   Labs (all labs ordered are listed, but only abnormal results are displayed) Labs Reviewed  SARS CORONAVIRUS 2 BY RT PCR (HOSPITAL ORDER, [facility])  CBC WITH DIFFERENTIAL/PLATELET  BASIC METABOLIC PANEL    EKG None  Radiology No results found.  Procedures Procedures (including critical care time)  Medications Ordered in ED Medications  vancomycin (VANCOREADY) IVPB 2000 mg/400 mL (has no administration in time range)    ED Course  I have reviewed the triage vital signs and the nursing notes.  Pertinent labs & imaging results that were available during my care of the patient were reviewed by me and considered in my medical decision making (see chart for details).    MDM Rules/Calculators/A&P                          Patient here with recurrent cellulitis.  He was seen a week ago for the same an has been taking doxycycline.  He states that he just finished the course.  He is having worsening pain in the left leg.  There is significant erythema to the left lateral ankle and foot concerning for cellulitis.  It would appear that the patient is failing outpatient therapy.  Will need admission for IV antibiotics.  Shared visit with Dr. Leonette Monarch, who agrees with the plan.  Patient signed out at shift change.  Plan: Follow-up on labs Admit for cellulitis failing outpatient treatment.   Final Clinical Impression(s) / ED Diagnoses Final diagnoses:  Cellulitis of left lower extremity    Rx / DC Orders ED Discharge Orders    None       Montine Circle, PA-C 05/07/20 0603    Fatima Blank, MD 05/07/20 [phone]

## 2020-05-07 NOTE — ED Notes (Signed)
Lunch tray given. 

## 2020-05-07 NOTE — ED Notes (Signed)
No complaints at present aware he is waiting on inpatient bed

## 2020-05-07 NOTE — H&P (Signed)
History and Physical    Eric Schultz XIP:[id number] DOB: 11-10-1940 DOA: 05/06/2020  Referring MD/NP/PA: Gwenevere Ghazi [facility]  Consultants: Dr. Burr Medico oncology Patient coming from: Home  Chief Complaint: Leg swelling and pain  I have personally briefly reviewed patient's old medical records in [place]   HPI: Eric Schultz is a 79 y.o. male with medical history significant of hypertension, recurrent cellulitis, venous stasis, lymphedema, liver cirrhosis, rectal cancer, and morbid obesity presents for worsening bilateral leg swelling and pain.   Seen initially on 9/3 for worsening swelling and redness of the left leg for which she was diagnosed with cellulitis treated with 1 dose of clindamycin IV and and prescribed doxycycline.  Doppler ultrasounds obtained during that visit did not show any signs of a DVT.  Denied having any significant injury to onset symptoms.  Patient reports that he has been taking doxycycline as prescribed with although reports still having a few days left of medication take.  Complains of having stabbing and achy pain in both legs.  Notes that his legs have been weeping clear yellow fluid.  Denies having any significant fevers or chills at home.  He was seen in urgent care 5 days ago for and placed in Unna boots.  He admits that he still having rectal bleeding.  It appears he had been seen by Dr. Burr Medico of oncology, but states that he does not want her to do the surgery.  He reports that he would like to get a second opinion, but it appears that he was discharged from Dr. Burr Medico care due to inappropriate comments made during his initial visit.  He reports that he does not want anyone coming out to his house.  ED Course: Upon admission into the emergency department patient was seen to be afebrile with heart rates 55-63, blood pressure 140/69-163/69, and all other vital signs maintained.  Labs appeared similar to previous.  Patient was started on  empiric antibiotics of vancomycin.  TRH called to admit for cellulitis which failed outpatient antibiotic treatment.  Review of Systems  Constitutional: Negative for chills and fever.  HENT: Negative for congestion and ear discharge.   Eyes: Negative for photophobia and pain.  Respiratory: Negative for cough and shortness of breath.   Cardiovascular: Positive for leg swelling. Negative for chest pain.  Gastrointestinal: Positive for blood in stool. Negative for nausea and vomiting.  Genitourinary: Negative for dysuria and hematuria.  Musculoskeletal: Positive for myalgias. Negative for falls.  Skin: Positive for rash.  Neurological: Negative for focal weakness and loss of consciousness.  Psychiatric/Behavioral: Negative for substance abuse.    Past Medical History:  Diagnosis Date  . Allergy   . Cellulitis    left lower leg  . Gout   . HOH (hard of hearing)   . Hypertension    "borderline"  . Sleep apnea     History reviewed. No pertinent surgical history.   reports that he has never smoked. He has never used smokeless tobacco. He reports that he does not drink alcohol and does not use drugs.  Allergies  Allergen Reactions  . Sulfa Antibiotics Nausea And Vomiting    Family History  Problem Relation Age of Onset  . Heart failure Mother   . Heart failure Father   . Stroke Father   . Colon cancer Neg Hx   . Esophageal cancer Neg Hx   . Rectal cancer Neg Hx   . Stomach cancer Neg Hx     Prior  to Admission medications   Medication Sig Start Date End Date Taking? Authorizing Provider  doxycycline (VIBRAMYCIN) 100 MG capsule Take 1 capsule (100 mg total) by mouth 2 (two) times daily. 04/28/20   Malvin Johns, MD  HYDROcodone-acetaminophen (NORCO/VICODIN) 5-325 MG tablet Take 1 tablet by mouth every 6 (six) hours as needed. 05/02/20   Loura Halt A, NP  IBUPROFEN PO Take by mouth 4 (four) times daily as needed (pain).    [provider]  potassium chloride SA  (KLOR-CON) 20 MEQ tablet Take 1 tablet (20 mEq total) by mouth daily. 04/28/20   Malvin Johns, MD  furosemide (LASIX) 20 MG tablet Take 1 tablet (20 mg total) by mouth daily. (please follow up with your PCP to check labs and weight within 1 week) 03/25/19 09/29/19  Elodia Florence., MD    Physical Exam:  Constitutional: Morbidly obese male who appears to be in no acute distress Vitals:   05/06/20 2032 05/06/20 2049 05/06/20 2308 05/07/20 0300  BP: 140/69  140/72 (!) 163/69  Pulse: 63  (!) 57 (!) 55  Resp: 20  19 18   Temp: 98.7 F (37.1 C)  98.4 F (36.9 C) 98.3 F (36.8 C)  TempSrc: Oral     SpO2: 97%  99% 100%  Weight:  (!) 136.1 kg    Height:  5\' 11"  (1.803 m)     Eyes: PERRL, lids and conjunctivae normal ENMT: Mucous membranes are moist. Posterior pharynx clear of any exudate or lesions.  Neck: normal, supple, no masses, no thyromegaly Respiratory: Decreased overall aeration with no significant wheezes or crackles appreciated.  Patient currently maintaining O2 saturations on room air. Cardiovascular: Regular rate and rhythm, no murmurs / rubs / gallops.  +2 pitting extremity edema. 2+ pedal pulses. No carotid bruits.  Abdomen: Protuberant abdomen.  No tenderness, no masses palpated. No hepatosplenomegaly. Bowel sounds positive.  Musculoskeletal: no clubbing / cyanosis. No joint deformity upper and lower extremities. Good ROM, no contractures. Normal muscle tone.  Skin: Venous stasis appreciated with erythema and mild increased warmth noted of the left leg.  Weeping serosanguineous fluid. Neurologic: CN 2-12 grossly intact. Sensation intact, DTR normal. Strength 5/5 in all 4.  Psychiatric: Normal judgment and insight. Alert and oriented x 3. Normal mood.     Labs on Admission: I have personally reviewed following labs and imaging studies  CBC: Recent Labs  Lab 05/07/20 0650  WBC 3.5*  NEUTROABS 2.0  HGB 11.5*  HCT 34.3*  MCV 100.0  PLT 94*   Basic Metabolic  Panel: Recent Labs  Lab 05/07/20 0650  NA 141  K 3.5  CL 108  CO2 26  GLUCOSE 94  BUN 11  CREATININE 0.91  CALCIUM 8.5*   GFR: Estimated Creatinine Clearance: 94.2 mL/min (by C-G formula based on SCr of 0.91 mg/dL). Liver Function Tests: No results for input(s): AST, ALT, ALKPHOS, BILITOT, PROT, ALBUMIN in the last 168 hours. No results for input(s): LIPASE, AMYLASE in the last 168 hours. No results for input(s): AMMONIA in the last 168 hours. Coagulation Profile: No results for input(s): INR, PROTIME in the last 168 hours. Cardiac Enzymes: No results for input(s): CKTOTAL, CKMB, CKMBINDEX, TROPONINI in the last 168 hours. BNP (last 3 results) No results for input(s): PROBNP in the last 8760 hours. HbA1C: No results for input(s): HGBA1C in the last 72 hours. CBG: No results for input(s): GLUCAP in the last 168 hours. Lipid Profile: No results for input(s): CHOL, HDL, LDLCALC, TRIG, CHOLHDL, LDLDIRECT in  the last 72 hours. Thyroid Function Tests: No results for input(s): TSH, T4TOTAL, FREET4, T3FREE, THYROIDAB in the last 72 hours. Anemia Panel: No results for input(s): VITAMINB12, FOLATE, FERRITIN, TIBC, IRON, RETICCTPCT in the last 72 hours. Urine analysis:    Component Value Date/Time   COLORURINE YELLOW 03/20/2019 2252   APPEARANCEUR CLEAR 03/20/2019 2252   LABSPEC 1.020 03/20/2019 2252   PHURINE 5.0 03/20/2019 2252   GLUCOSEU NEGATIVE 03/20/2019 2252   HGBUR SMALL (A) 03/20/2019 2252   BILIRUBINUR NEGATIVE 03/20/2019 2252   KETONESUR NEGATIVE 03/20/2019 2252   PROTEINUR NEGATIVE 03/20/2019 2252   UROBILINOGEN 0.2 02/09/2014 1954   NITRITE NEGATIVE 03/20/2019 2252   LEUKOCYTESUR NEGATIVE 03/20/2019 2252   Sepsis Labs: Recent Results (from the past 240 hour(s))  Culture, blood (routine x 2)     Status: None   Collection Time: 04/28/20  7:33 AM   Specimen: BLOOD RIGHT HAND  Result Value Ref Range Status   Specimen Description BLOOD RIGHT HAND  Final    Special Requests   Final    BOTTLES DRAWN AEROBIC AND ANAEROBIC Blood Culture results may not be optimal due to an inadequate volume of blood received in culture bottles   Culture   Final    NO GROWTH 5 DAYS Performed at [facility], [place] [address] [place], [place] [address]    Report Status 05/03/2020 FINAL  Final  Culture, blood (routine x 2)     Status: None   Collection Time: 04/28/20  9:00 AM   Specimen: BLOOD  Result Value Ref Range Status   Specimen Description BLOOD SITE NOT SPECIFIED  Final   Special Requests   Final    BOTTLES DRAWN AEROBIC AND ANAEROBIC Blood Culture adequate volume   Culture   Final    NO GROWTH 5 DAYS Performed at [facility], [place] [address], [place], [place] [address]    Report Status 05/03/2020 FINAL  Final  SARS Coronavirus 2 by RT PCR (hospital order, performed in [facility] hospital lab) Nasopharyngeal Nasopharyngeal Swab     Status: None   Collection Time: 05/07/20  5:57 AM   Specimen: Nasopharyngeal Swab  Result Value Ref Range Status   SARS Coronavirus 2 NEGATIVE NEGATIVE Final    Comment: (NOTE) SARS-CoV-2 target nucleic acids are NOT DETECTED.  The SARS-CoV-2 RNA is generally detectable in upper and lower respiratory specimens during the acute phase of infection. The lowest concentration of SARS-CoV-2 viral copies this assay can detect is 250 copies / mL. A negative result does not preclude SARS-CoV-2 infection and should not be used as the sole basis for treatment or other patient management decisions.  A negative result may occur with improper specimen collection / handling, submission of specimen other than nasopharyngeal swab, presence of viral mutation(s) within the areas targeted by this assay, and inadequate number of viral copies (<250 copies / mL). A negative result must be combined with clinical observations, patient history, and epidemiological information.  Fact Sheet for Patients:    StrictlyIdeas.no  Fact Sheet for Healthcare Providers: BankingDealers.co.za  This test is not yet approved or  cleared by the [place] FDA and has been authorized for detection and/or diagnosis of SARS-CoV-2 by FDA under an Emergency Use Authorization (EUA).  This EUA will remain in effect (meaning this test can be used) for the duration of the COVID-19 declaration under Section 564(b)(1) of the Act, 21 U.S.C. section 360bbb-3(b)(1), unless the authorization is terminated or revoked sooner.  Performed at  [facility], [place] [address] [place], [place] [address]      Radiological Exams on Admission: No results found.   Assessment/Plan Cellulitis of leg: Acute on chronic.  Patient presented with complaints of worsening swelling and pain of his legs.  He had been on antibiotics of doxycycline for cellulitis since 9/3.  Question if patient able to adequately care for himself at home. -Admit to a MedSurg bed -Cellulitis admission order set utilized -Elevate legs -Check blood cultures -Continue empiric antibiotics of vancomycin per pharmacy -Wound care consult   Chronic venous stasis/lymphedema -Patient likely would benefit from wrapping of lower extremities.  Heart failure with preserved EF: Last EF noted to be around 60 to 65% by echocardiogram in 02/2019. -Strict intake and output -Daily weights -Add on BNP -May benefit from IV diuresis.  Pancytopenia: Chronic.  On admission WBC 3.5, hemoglobin 11.5, and platelet count 94 which appears relatively near previous.  Platelet counts are likely low due to patient's history of liver cirrhosis. -Continue to monitor  Liver cirrhosis: Chronic.  Patient denied any significant history of alcohol abuse.  Previously negative for HIV and hepatitis C.  Suspect fatty liver disease as likely cause.   Rectal cancer: Patient had been recently diagnosed with adenocarcinoma of the rectum.   He was fired by Dr. Burr Medico after his initial visit.  He declined need to be seen at this time and reports that he will go to [place] to be evaluated. -Recommend patient establish care with a new oncologist as soon as possible.  Dr. Burr Medico recommended possibly patient being seen by Dr. Benay Spice, but did not appear that he wanted to be seen by their practice at this time  OSA: Patient not on CPAP at home. -Supplemental oxygen at night  Morbid obesity: BMI 41.85 kg/m  DVT prophylaxis: SCDs Code Status: Full Family Communication: Patient declined for me to update any family Disposition Plan: To be determined Consults called: None  Admission status:Inpatient  Norval Morton MD [facility] Pager [phone]   If 7PM-7AM, please contact night-coverage [email or web address] Password [email or web address]  05/07/2020, 9:01 AM

## 2020-05-07 NOTE — ED Notes (Signed)
Assisted patient to bathroom ambulated with a walker , patient did well.

## 2020-05-07 NOTE — ED Provider Notes (Signed)
Patient was received at handoff due to shift change from [employer], [place].  He provided HPI, current work-up, likely disposition please see his note for full detail.  In short patient with significant medical history of cellulitis of the left leg, gout, hypertension, presents emergency department with chief complaint of recurrent cellulitis of the leg pain.  He was seen here about 1 week ago, had a negative DVT study, received IV clindamycin and discharged home on Doxy which he has been taking without any relief.  Current work-up includes pending Covid, BMP and CBC.  Started on IV vancomycin. Physical Exam  BP (!) 163/69   Pulse (!) 55   Temp 98.3 F (36.8 C)   Resp 18   Ht 5\' 11"  (1.803 m)   Wt (!) 136.1 kg   SpO2 100%   BMI 41.85 kg/m   Physical Exam Vitals and nursing note reviewed.  Constitutional:      General: He is not in acute distress.    Appearance: He is not ill-appearing.  HENT:     Head: Normocephalic and atraumatic.     Nose: No congestion.     Mouth/Throat:     Mouth: Mucous membranes are moist.     Pharynx: Oropharynx is clear.  Eyes:     General: No scleral icterus. Cardiovascular:     Rate and Rhythm: Normal rate and regular rhythm.     Pulses: Normal pulses.     Heart sounds: No murmur heard.  No friction rub. No gallop.   Pulmonary:     Effort: No respiratory distress.     Breath sounds: No wheezing, rhonchi or rales.  Abdominal:     General: There is no distension.     Tenderness: There is no abdominal tenderness. There is no right CVA tenderness, left CVA tenderness or guarding.  Musculoskeletal:        General: Swelling present.     Right lower leg: Edema present.     Left lower leg: Edema present.     Comments: Legs were visualized, had chronic lymphedema changes, had 2+ edema noted up to the shins bilaterally.  Left leg was erythematous,, painful to palpation along the lateral malleolus.  No purulent discharge or fluctuance felt.  Neurovascular  fully intact.  Skin:    General: Skin is warm and dry.     Capillary Refill: Capillary refill takes less than 2 seconds.     Findings: No rash.  Neurological:     General: No focal deficit present.     Mental Status: He is alert and oriented to person, place, and time.  Psychiatric:        Mood and Affect: Mood normal.     ED Course/Procedures     Procedures  MDM   I have personally reviewed all imaging, labs and have interpreted them.  CBC does not show electrolyte abnormalities, no signs of AKI, Covid negative, CBC showing no signs of leukocytosis, macrocytic anemia which appears to be at baseline for patient.  Will consult with hospitalist for further recommendation and evaluation.   Spoke with Dr. Tamala Julian of the hospitalist team he agrees patient should be admitted for further treatment of his cellulitis most likely secondary to him chronic lymphedema.  Patient will be admitted for further evaluation management.  Patient care will be transferred to hospitalist team.    Marcello Fennel, PA-C 05/07/20 [place], [place], DO 05/07/20 1108

## 2020-05-07 NOTE — ED Provider Notes (Signed)
Attestation: Medical screening examination/treatment/procedure(s) were conducted as a shared visit with non-physician practitioner(s) and myself.  I personally evaluated the patient during the encounter.   Briefly, the patient is a 79 y.o. male here for bilateral lower extremity pain.  Patient was treated for cellulitis 1 weeks ago with doxycycline.  Reports that symptoms have persisted and are now worsening.  Vitals:   05/06/20 2308 05/07/20 0300  BP: 140/72 (!) 163/69  Pulse: (!) 57 (!) 55  Resp: 19 18  Temp: 98.4 F (36.9 C) 98.3 F (36.8 C)  SpO2: 99% 100%    CONSTITUTIONAL: Chronically ill-appearing, NAD NEURO:  Alert and oriented x 3, no focal deficits EYES:  pupils equal and reactive ENT/NECK:  trachea midline, no JVD CARDIO: Regular rate, regular rhythm, well-perfused PULM: None labored breathing GI/GU:  Abdomen non-distended MSK/SPINE:  No gross deformities, significant bilateral lower extremity edema left greater than right SKIN: Chronic skin changes from lymphedema to bilateral lower extremities left greater than right.  Additionally patient has erythema to the lateral aspect of the left leg and ankle. PSYCH:  Appropriate speech and behavior   EKG Interpretation  Date/Time:    Ventricular Rate:    PR Interval:    QRS Duration:   QT Interval:    QTC Calculation:   R Axis:     Text Interpretation:         Work up consistent with cellulitis.  Patient has failed outpatient management and will be admitted for IV antibiotics pending labs.      Fatima Blank, MD 05/07/20 [phone]

## 2020-05-08 LAB — BASIC METABOLIC PANEL
Anion gap: 7 (ref 5–15)
BUN: 11 mg/dL (ref 8–23)
CO2: 26 mmol/L (ref 22–32)
Calcium: 8.4 mg/dL — ABNORMAL LOW (ref 8.9–10.3)
Chloride: 108 mmol/L (ref 98–111)
Creatinine, Ser: 0.97 mg/dL (ref 0.61–1.24)
GFR calc Af Amer: >60 mL/min (ref >60)
GFR calc non Af Amer: >60 mL/min (ref >60)
Glucose, Bld: 94 mg/dL (ref 70–99)
Potassium: 3.6 mmol/L (ref 3.5–5.1)
Sodium: 141 mmol/L (ref 135–145)

## 2020-05-08 LAB — CBC
HCT: 32.2 % — ABNORMAL LOW (ref 39.0–52.0)
Hemoglobin: 10.8 g/dL — ABNORMAL LOW (ref 13.0–17.0)
MCH: 32.5 pg (ref 26.0–34.0)
MCHC: 33.5 g/dL (ref 30.0–36.0)
MCV: 97 fL (ref 80.0–100.0)
Platelets: 92 10*3/uL — ABNORMAL LOW (ref 150–400)
RBC: 3.32 MIL/uL — ABNORMAL LOW (ref 4.22–5.81)
RDW: 13.8 % (ref 11.5–15.5)
WBC: 4 10*3/uL (ref 4.0–10.5)
nRBC: 0 % (ref 0.0–0.2)

## 2020-05-08 MED ORDER — FUROSEMIDE 10 MG/ML IJ SOLN
40.0000 mg | Freq: Once | INTRAMUSCULAR | Status: AC
  Administered 2020-05-08: 40 mg via INTRAVENOUS
  Filled 2020-05-08: qty 4

## 2020-05-08 MED ORDER — ZOLPIDEM TARTRATE 5 MG PO TABS: 5.0000 mg | ORAL_TABLET | Freq: Every evening | ORAL | Status: DC | PRN

## 2020-05-08 NOTE — Evaluation (Signed)
Physical Therapy Evaluation Patient Details Name: Eric Schultz MRN: [id number] DOB: 16-Sep-1940 Today's Date: 05/08/2020   History of Present Illness  79 y.o. male with medical history significant of hypertension, recurrent cellulitis, venous stasis, lymphedema, liver cirrhosis, rectal cancer, and morbid obesity presents for worsening bilateral leg swelling and pain.  Dx of cellulitis.  Clinical Impression  Pt admitted with above diagnosis. Pt ambulated 16' with RW, no loss of balance. Distance limited by L foot pain. He lives alone, but stated he has a friend that can assist as needed. HHPT recommended, but pt refused this. He feels he will be able to manage at home without further PT.  Pt currently with functional limitations due to the deficits listed below (see PT Problem List). Pt will benefit from skilled PT to increase their independence and safety with mobility to allow discharge to the venue listed below.       Follow Up Recommendations Home health PT;Supervision for mobility/OOB (HHPT recommended, pt stated he does not want this)    Equipment Recommendations  None recommended by PT    Recommendations for Other Services       Precautions / Restrictions Precautions Precautions: Fall Precaution Comments: pt reports 1 fall in past 6 months Restrictions Weight Bearing Restrictions: No      Mobility  Bed Mobility Overal bed mobility: Modified Independent             General bed mobility comments: HOB up, heavy reliance upon rail to pull trunk up, increased time  Transfers Overall transfer level: Needs assistance Equipment used: Rolling walker (2 wheeled) Transfers: Sit to/from Stand Sit to Stand: Min guard;From elevated surface         General transfer comment: VCs hand placement  Ambulation/Gait Ambulation/Gait assistance: Min guard Gait Distance (Feet): 16 Feet Assistive device: Rolling walker (2 wheeled) Gait Pattern/deviations: Step-through  pattern;Decreased stride length;Decreased weight shift to left;Antalgic Gait velocity: decr   General Gait Details: no loss of balance, distance limited by L foot pain  Stairs            Wheelchair Mobility    Modified Rankin (Stroke Patients Only)       Balance Overall balance assessment: History of Falls;Needs assistance   Sitting balance-Leahy Scale: Good     Standing balance support: Bilateral upper extremity supported Standing balance-Leahy Scale: Poor Standing balance comment: relies on BUE support                             Pertinent Vitals/Pain Pain Assessment: 0-10 Pain Score: 3  Pain Location: L foot with movement or if its bumped Pain Descriptors / Indicators: Sore Pain Intervention(s): Limited activity within patient's tolerance;Monitored during session;Repositioned (pt declined pain medication)    Home Living Family/patient expects to be discharged to:: Private residence Living Arrangements: Alone Available Help at Discharge: Friend(s);Available PRN/intermittently Type of Home: House Home Access: Ramped entrance     Home Layout: Two level;Able to live on main level with bedroom/bathroom Home Equipment: Gilford Rile - 2 wheels;Wheelchair - manual      Prior Function Level of Independence: Independent         Comments: pt stated he drives, sleeps in recliner, independent with ADLs, walked without AD PTA     Hand Dominance        Extremity/Trunk Assessment   Upper Extremity Assessment Upper Extremity Assessment: Overall WFL for tasks assessed    Lower Extremity Assessment Lower Extremity Assessment: LLE deficits/detail  LLE Deficits / Details: knee ext at least 3/5, able to DF/PF ankle actively thru ~50% of full range LLE: Unable to fully assess due to pain LLE Sensation:  (NT- foot wrapped in thick gauze. sensation intact to light touch on R foot)    Cervical / Trunk Assessment Cervical / Trunk Assessment: Normal   Communication   Communication: HOH  Cognition Arousal/Alertness: Awake/alert Behavior During Therapy: WFL for tasks assessed/performed Overall Cognitive Status: Within Functional Limits for tasks assessed                                        General Comments      Exercises     Assessment/Plan    PT Assessment Patient needs continued PT services  PT Problem List Decreased activity tolerance;Decreased mobility;Pain;Decreased knowledge of use of DME;Decreased balance       PT Treatment Interventions Gait training;DME instruction;Functional mobility training;Therapeutic exercise;Balance training;Patient/family education;Therapeutic activities    PT Goals (Current goals can be found in the Care Plan section)  Acute Rehab PT Goals Patient Stated Goal: walk farther PT Goal Formulation: With patient Time For Goal Achievement: 05/22/20 Potential to Achieve Goals: Fair    Frequency Min 3X/week   Barriers to discharge        Co-evaluation               AM-PAC PT "6 Clicks" Mobility  Outcome Measure Help needed turning from your back to your side while in a flat bed without using bedrails?: A Little Help needed moving from lying on your back to sitting on the side of a flat bed without using bedrails?: A Little Help needed moving to and from a bed to a chair (including a wheelchair)?: A Little Help needed standing up from a chair using your arms (e.g., wheelchair or bedside chair)?: A Little Help needed to walk in hospital room?: A Little Help needed climbing 3-5 steps with a railing? : A Little 6 Click Score: 18    End of Session Equipment Utilized During Treatment: Gait belt Activity Tolerance: Patient tolerated treatment well Patient left: in chair;with call bell/phone within reach Nurse Communication: Mobility status PT Visit Diagnosis: Difficulty in walking, not elsewhere classified (R26.2);Pain Pain - Right/Left: Left Pain - part of body:  Ankle and joints of foot    Time: 0211-1552 PT Time Calculation (min) (ACUTE ONLY): 21 min   Charges:   PT Evaluation $PT Eval Low Complexity: 1 Low         Blondell Reveal Kistler PT 05/08/2020  Acute Rehabilitation Services Pager [phone] Office [phone]

## 2020-05-08 NOTE — Progress Notes (Signed)
Progress Note    Eric Schultz  WIO:[id number] DOB: 1941/06/13  DOA: 05/06/2020 PCP: [facility]    Brief Narrative:     Medical records reviewed and are as summarized below:  Eric Schultz is an 79 y.o. male with medical history significant of hypertension, recurrent cellulitis, venous stasis, lymphedema, liver cirrhosis, rectal cancer, and morbid obesity presents for worsening bilateral leg swelling and pain.   Seen initially on 9/3 for worsening swelling and redness of the left leg for which he was diagnosed with cellulitis treated with 1 dose of clindamycin IV and and prescribed doxycycline.  Doppler ultrasounds obtained during that visit did not show any signs of a DVT.  Denied having any significant injury to onset symptoms.  Patient reports that he has been taking doxycycline as prescribed with although reports still having a few days left of medication take.  Complains of having stabbing and achy pain in both legs.  Notes that his legs have been weeping clear yellow fluid.    Assessment/Plan:   Principal Problem:   Cellulitis Active Problems:   Chronic venous stasis dermatitis of both lower extremities   Rectal adenocarcinoma (HCC)   Pancytopenia (HCC)   Obesity, Class III, BMI 40-49.9 (morbid obesity) (HCC)   Liver cirrhosis (HCC)   Cellulitis of leg/lymphedema/chronic venous stasis: Acute on chronic.  Patient presented with complaints of worsening swelling and pain of his legs.  He had been on antibiotics of doxycycline for cellulitis since 9/3.  Question if patient able to adequately care for himself at home. -Cellulitis admission order set utilized -Elevate legs - blood cultures -Continue empiric antibiotics of vancomycin per pharmacy -appreciate wound care consult: Gently wash LLE with soap and water. Pat dry. Apply Sween Moisturizing Ointment (pink and white tube in clean utility). THEN SPRINKLE ANTIFUNGAL POWDER (green and white bottle in clean  utility) over the whole LLE. Place Xeroform gauze Kellie Simmering [phone]) over any weeping areas. Cover with ABD pads. Spiral wrap kerlex to secure in place. I have also entered an order to elevate the legs, with focus on the LLE. 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 -Strict intake and output -Daily weights -lasix IV x 1 -reassess need daily -not on any diuretics at home  Pancytopenia: Chronic.   - likely due to patient's history of liver cirrhosis. -Continue to monitor  Liver cirrhosis: Chronic.  Patient denied any significant history of alcohol abuse.  Previously negative for HIV and hepatitis C.  Suspect fatty liver disease as likely cause.   Rectal cancer:  -Patient had been recently diagnosed with adenocarcinoma of the rectum.   -He was fired by Dr. Burr Medico after his initial visit due to inappropriate comments  -He declined need to be seen at this time and reports that he will go to [place] to be evaluated. -Recommend patient establish care with a new oncologist as soon as possible.  Dr. Burr Medico recommended possibly patient being seen by Dr. Benay Spice, but did not appear that he wanted to be seen by their practice at this time  OSA: Patient not on CPAP at home. -Supplemental oxygen at night  Morbid obesity:  Estimated body mass index is 41.85 kg/m as calculated from the following:   Height as of this encounter: 5\' 11"  (1.803 m).   Weight as of this encounter: 136.1 kg.   I question  whether patient can live alone, may need social service consult to follow up at home when d/c'd   Family Communication/Anticipated D/C date and plan/Code Status   DVT prophylaxis: scd- may need to add Lovenox if plts stable Code Status: Full Code.  Disposition Plan: Status is: Inpatient  Remains inpatient appropriate because:failed outpatient abx   Dispo:  The patient is from: Home              Anticipated d/c is to: Home              Anticipated d/c date is: 2 days              Patient currently is not medically stable to d/c.         Medical Consultants:    [place] nurse    Subjective:   Wants to go home  Objective:    Vitals:   05/07/20 1900 05/07/20 2300 05/08/20 0407 05/08/20 0742  BP: (!) 155/89 (!) 152/75 (!) 152/74 (!) 151/94  Pulse: 63 66 61 (!) 58  Resp: 18 18 17 14   Temp: 98.6 F (37 C) 98.4 F (36.9 C) 97.7 F (36.5 C) 98.2 F (36.8 C)  TempSrc: Oral Oral Oral Oral  SpO2: 100% 99% 93% 93%  Weight:      Height:        Intake/Output Summary (Last 24 hours) at 05/08/2020 1115 Last data filed at 05/08/2020 0900 Gross per 24 hour  Intake 490 ml  Output 2450 ml  Net -1960 ml   Filed Weights   05/06/20 2049  Weight: (!) 136.1 kg    Exam:  General: Appearance:    Severely obese male  Sitting half way off bed with legs dangling, wearing kakis pulled to mid chest and suspenders     Lungs:      respirations unlabored  Heart:    Bradycardic. Normal rhythm. No murmurs, rubs, or gallops.   MS:         Neurologic:   Awake, alert- very poor insight into disease process    Data Reviewed:   I have personally reviewed following labs and imaging studies:  Labs: Labs show the following:   Basic Metabolic Panel: Recent Labs  Lab 05/07/20 0650 05/08/20 0550  NA 141 141  K 3.5 3.6  CL 108 108  CO2 26 26  GLUCOSE 94 94  BUN 11 11  CREATININE 0.91 0.97  CALCIUM 8.5* 8.4*   GFR Estimated Creatinine Clearance: 88.4 mL/min (by C-G formula based on SCr of 0.97 mg/dL). Liver Function Tests: No results for input(s): AST, ALT, ALKPHOS, BILITOT, PROT, ALBUMIN in the last 168 hours. No results for input(s): LIPASE, AMYLASE in the last 168 hours. No results for input(s): AMMONIA in the last 168 hours. Coagulation profile No results for input(s): INR, PROTIME in the last 168 hours.  CBC: Recent Labs    Lab 05/07/20 0650 05/08/20 0550  WBC 3.5* 4.0  NEUTROABS 2.0  --   HGB 11.5* 10.8*  HCT 34.3* 32.2*  MCV 100.0 97.0  PLT 94* 92*   Cardiac Enzymes: No results for input(s): CKTOTAL, CKMB, CKMBINDEX, TROPONINI in the last 168 hours. BNP (last 3 results) No results for input(s): PROBNP in the last 8760 hours. CBG: No results for input(s): GLUCAP in the last 168 hours. D-Dimer: No results for input(s): DDIMER in the last 72 hours. Hgb A1c: No results for input(s): HGBA1C in the last 72 hours. Lipid Profile: No results for input(s): CHOL, HDL, LDLCALC,  TRIG, CHOLHDL, LDLDIRECT in the last 72 hours. Thyroid function studies: No results for input(s): TSH, T4TOTAL, T3FREE, THYROIDAB in the last 72 hours.  Invalid input(s): FREET3 Anemia work up: No results for input(s): VITAMINB12, FOLATE, FERRITIN, TIBC, IRON, RETICCTPCT in the last 72 hours. Sepsis Labs: Recent Labs  Lab 05/07/20 0650 05/08/20 0550  WBC 3.5* 4.0    Microbiology Recent Results (from the past 240 hour(s))  SARS Coronavirus 2 by RT PCR (hospital order, performed in [facility] hospital lab) Nasopharyngeal Nasopharyngeal Swab     Status: None   Collection Time: 05/07/20  5:57 AM   Specimen: Nasopharyngeal Swab  Result Value Ref Range Status   SARS Coronavirus 2 NEGATIVE NEGATIVE Final    Comment: (NOTE) SARS-CoV-2 target nucleic acids are NOT DETECTED.  The SARS-CoV-2 RNA is generally detectable in upper and lower respiratory specimens during the acute phase of infection. The lowest concentration of SARS-CoV-2 viral copies this assay can detect is 250 copies / mL. A negative result does not preclude SARS-CoV-2 infection and should not be used as the sole basis for treatment or other patient management decisions.  A negative result may occur with improper specimen collection / handling, submission of specimen other than nasopharyngeal swab, presence of viral mutation(s) within the areas targeted by this  assay, and inadequate number of viral copies (<250 copies / mL). A negative result must be combined with clinical observations, patient history, and epidemiological information.  Fact Sheet for Patients:   StrictlyIdeas.no  Fact Sheet for Healthcare Providers: BankingDealers.co.za  This test is not yet approved or  cleared by the [place] FDA and has been authorized for detection and/or diagnosis of SARS-CoV-2 by FDA under an Emergency Use Authorization (EUA).  This EUA will remain in effect (meaning this test can be used) for the duration of the COVID-19 declaration under Section 564(b)(1) of the Act, 21 U.S.C. section 360bbb-3(b)(1), unless the authorization is terminated or revoked sooner.  Performed at [facility], [place] [address] [place], [place] [address]   Culture, blood (routine x 2)     Status: None (Preliminary result)   Collection Time: 05/07/20  8:00 PM   Specimen: BLOOD LEFT ARM  Result Value Ref Range Status   Specimen Description BLOOD LEFT ARM  Final   Special Requests   Final    BOTTLES DRAWN AEROBIC AND ANAEROBIC Blood Culture adequate volume   Culture   Final    NO GROWTH < 12 HOURS Performed at [facility], [place] [address] [place], [place] [address]    Report Status PENDING  Incomplete  Culture, blood (routine x 2)     Status: None (Preliminary result)   Collection Time: 05/07/20  8:16 PM   Specimen: BLOOD RIGHT HAND  Result Value Ref Range Status   Specimen Description BLOOD RIGHT HAND  Final   Special Requests   Final    BOTTLES DRAWN AEROBIC AND ANAEROBIC Blood Culture adequate volume   Culture   Final    NO GROWTH < 12 HOURS Performed at [facility], [place] [address], [place], [place] [address]    Report Status PENDING  Incomplete    Procedures and diagnostic studies:  No results found.  Medications:    Continuous Infusions: . vancomycin 1,250 mg (05/08/20 1114)      LOS: 1 day   Geradine Girt  [facility]   How to contact the [facility] Attending or Consulting provider [place] or covering provider  during after hours 7P -7A, for this patient?  1. Check the care team in [facility] and look for a) attending/consulting [facility] provider listed and b) the [facility] team listed 2. Log into [email or web address] and use [place]'s universal password to access. If you do not have the password, please contact the hospital operator. 3. Locate the [facility] provider you are looking for under [facility] and page to a number that you can be directly reached. 4. If you still have difficulty reaching the provider, please page the [facility] (Director on Call) for the Hospitalists listed on amion for assistance.  05/08/2020, 11:15 AM

## 2020-05-08 NOTE — Plan of Care (Signed)
  Problem: Education: Goal: Knowledge of General Education information will improve Description: Including pain rating scale, medication(s)/side effects and non-pharmacologic comfort measures Outcome: Progressing   Problem: Clinical Measurements: Goal: Will remain free from infection Outcome: Progressing   Problem: Activity: Goal: Risk for activity intolerance will decrease Outcome: Progressing   

## 2020-05-08 NOTE — Consult Note (Signed)
[place] Nurse wound follow up Patient receiving care in [facility] 5N15.  Dr. Eliseo Squires placed photos of the BLE in chart. Based on Secure Chat messages with Dr. Eliseo Squires, and her concern for possible fungus on LLE, I have modified my order as follows:  Gently wash LLE with soap and water. Pat dry. Apply Sween Moisturizing Ointment (pink and white tube in clean utility). THEN SPRINKLE ANTIFUNGAL POWDER (green and white bottle in clean utility) over the whole LLE. Place Xeroform gauze Kellie Simmering [phone]) over any weeping areas. Cover with ABD pads. Spiral wrap kerlex to secure in place. Val Riles, RN, MSN, CWOCN, CNS-BC, pager [phone]

## 2020-05-08 NOTE — Consult Note (Signed)
WOC Nurse Consult Note: Patient receiving care in [facility] 5N15. Consult completed remotely after telephone conversation with primary RN, Angelito. Reason for Consult: "cellulitis of the lower extremities" Wound type: Per Angelito, there are no wraps or dressings on the BLE.  There are no wounds present on the LLE, only scattered weeping areas. Pressure Injury POA: Yes/No/NA Measurement: Wound bed: Drainage (amount, consistency, odor)  Periwound: Dressing procedure/placement/frequency: Gently wash LLE with soap and water. Pat dry. Apply Sween Moisturizing Ointment (pink and white tube in clean utility). Place a Xeroform gauze Kellie Simmering [phone]) over any weeping areas. Cover with ABD pads. Spiral wrap kerlex to secure in place. I have also entered an order to elevate the legs, with focus on the LLE.  Treatment of cellulitis exceeds the scope of practice of the [place] nurse. However, I have provided topical orders to assist with any weeping of tissues that may occur. Val Riles, RN, MSN, CWOCN, CNS-BC, pager [phone]

## 2020-05-09 ENCOUNTER — Encounter (HOSPITAL_COMMUNITY): Payer: Self-pay | Admitting: Internal Medicine

## 2020-05-09 DIAGNOSIS — I872 Venous insufficiency (chronic) (peripheral): Secondary | ICD-10-CM

## 2020-05-09 DIAGNOSIS — L03116 Cellulitis of left lower limb: Secondary | ICD-10-CM

## 2020-05-09 DIAGNOSIS — D61818 Other pancytopenia: Secondary | ICD-10-CM

## 2020-05-09 DIAGNOSIS — C20 Malignant neoplasm of rectum: Secondary | ICD-10-CM

## 2020-05-09 DIAGNOSIS — L0390 Cellulitis, unspecified: Secondary | ICD-10-CM

## 2020-05-09 DIAGNOSIS — K7460 Unspecified cirrhosis of liver: Secondary | ICD-10-CM

## 2020-05-09 DIAGNOSIS — L02416 Cutaneous abscess of left lower limb: Secondary | ICD-10-CM

## 2020-05-09 HISTORY — DX: Cellulitis of left lower limb: L02.416

## 2020-05-09 HISTORY — DX: Cellulitis of left lower limb: L03.116

## 2020-05-09 LAB — CBC
HCT: 34.4 % — ABNORMAL LOW (ref 39.0–52.0)
Hemoglobin: 11.6 g/dL — ABNORMAL LOW (ref 13.0–17.0)
MCH: 32.7 pg (ref 26.0–34.0)
MCHC: 33.7 g/dL (ref 30.0–36.0)
MCV: 96.9 fL (ref 80.0–100.0)
Platelets: 88 10*3/uL — ABNORMAL LOW (ref 150–400)
RBC: 3.55 MIL/uL — ABNORMAL LOW (ref 4.22–5.81)
RDW: 13.6 % (ref 11.5–15.5)
WBC: 3.7 10*3/uL — ABNORMAL LOW (ref 4.0–10.5)
nRBC: 0 % (ref 0.0–0.2)

## 2020-05-09 LAB — BASIC METABOLIC PANEL
Anion gap: 7 (ref 5–15)
BUN: 14 mg/dL (ref 8–23)
CO2: 25 mmol/L (ref 22–32)
Calcium: 8.5 mg/dL — ABNORMAL LOW (ref 8.9–10.3)
Chloride: 108 mmol/L (ref 98–111)
Creatinine, Ser: 0.95 mg/dL (ref 0.61–1.24)
GFR calc Af Amer: >60 mL/min (ref >60)
GFR calc non Af Amer: >60 mL/min (ref >60)
Glucose, Bld: 93 mg/dL (ref 70–99)
Potassium: 3.4 mmol/L — ABNORMAL LOW (ref 3.5–5.1)
Sodium: 140 mmol/L (ref 135–145)

## 2020-05-09 LAB — CALCIUM, IONIZED: Calcium, Ionized, Serum: 4.8 mg/dL (ref 4.5–5.6)

## 2020-05-09 LAB — URIC ACID: Uric Acid, Serum: 8.2 mg/dL (ref 3.7–8.6)

## 2020-05-09 MED ORDER — CLINDAMYCIN HCL 150 MG PO CAPS: 450.0000 mg | ORAL_CAPSULE | Freq: Three times a day (TID) | ORAL | 0 refills | Status: AC

## 2020-05-09 MED ORDER — FUROSEMIDE 40 MG PO TABS: 40.0000 mg | ORAL_TABLET | Freq: Every day | ORAL | 11 refills | Status: AC

## 2020-05-09 MED ORDER — INDOMETHACIN 50 MG PO CAPS: 50.0000 mg | ORAL_CAPSULE | Freq: Three times a day (TID) | ORAL | 0 refills | Status: AC

## 2020-05-09 MED ORDER — FUROSEMIDE 10 MG/ML IJ SOLN
40.0000 mg | Freq: Two times a day (BID) | INTRAMUSCULAR | Status: DC
  Administered 2020-05-09: 40 mg via INTRAVENOUS
  Filled 2020-05-09: qty 4

## 2020-05-09 MED ORDER — POTASSIUM CHLORIDE CRYS ER 20 MEQ PO TBCR
30.0000 meq | EXTENDED_RELEASE_TABLET | ORAL | Status: DC
  Administered 2020-05-09: 30 meq via ORAL
  Filled 2020-05-09: qty 1

## 2020-05-09 MED ORDER — INDOMETHACIN 25 MG PO CAPS
50.0000 mg | ORAL_CAPSULE | Freq: Three times a day (TID) | ORAL | Status: DC
  Filled 2020-05-09: qty 2

## 2020-05-09 NOTE — Discharge Instructions (Signed)
Cellulitis, Adult  Cellulitis is a skin infection. The infected area is often warm, red, swollen, and sore. It occurs most often in the arms and lower legs. It is very important to get treated for this condition. What are the causes? This condition is caused by bacteria. The bacteria enter through a break in the skin, such as a cut, burn, insect bite, open sore, or crack. What increases the risk? This condition is more likely to occur in people who:  Have a weak body defense system (immune system).  Have open cuts, burns, bites, or scrapes on the skin.  Are older than 78 years of age.  Have a blood sugar problem (diabetes).  Have a long-lasting (chronic) liver disease (cirrhosis) or kidney disease.  Are very overweight (obese).  Have a skin problem, such as: ? Itchy rash (eczema). ? Slow movement of blood in the veins (venous stasis). ? Fluid buildup below the skin (edema).  Have been treated with high-energy rays (radiation).  Use IV drugs. What are the signs or symptoms? Symptoms of this condition include:  Skin that is: ? Red. ? Streaking. ? Spotting. ? Swollen. ? Sore or painful when you touch it. ? Warm.  A fever.  Chills.  Blisters. How is this diagnosed? This condition is diagnosed based on:  Medical history.  Physical exam.  Blood tests.  Imaging tests. How is this treated? Treatment for this condition may include:  Medicines to treat infections or allergies.  Home care, such as: ? Rest. ? Placing cold or warm cloths (compresses) on the skin.  Hospital care, if the condition is very bad. Follow these instructions at home: Medicines  Take over-the-counter and prescription medicines only as told by your doctor.  If you were prescribed an antibiotic medicine, take it as told by your doctor. Do not stop taking it even if you start to feel better. General instructions   Drink enough fluid to keep your pee (urine) pale yellow.  Do not touch  or rub the infected area.  Raise (elevate) the infected area above the level of your heart while you are sitting or lying down.  Place cold or warm cloths on the area as told by your doctor.  Keep all follow-up visits as told by your doctor. This is important. Contact a doctor if:  You have a fever.  You do not start to get better after 1-2 days of treatment.  Your bone or joint under the infected area starts to hurt after the skin has healed.  Your infection comes back. This can happen in the same area or another area.  You have a swollen bump in the area.  You have new symptoms.  You feel ill and have muscle aches and pains. Get help right away if:  Your symptoms get worse.  You feel very sleepy.  You throw up (vomit) or have watery poop (diarrhea) for a long time.  You see red streaks coming from the area.  Your red area gets larger.  Your red area turns dark in color. These symptoms may represent a serious problem that is an emergency. Do not wait to see if the symptoms will go away. Get medical help right away. Call your local emergency services (911 in the [place] Do not drive yourself to the hospital. Summary  Cellulitis is a skin infection. The area is often warm, red, swollen, and sore.  This condition is treated with medicines, rest, and cold and warm cloths.  Take all medicines only   as told by your doctor.  Tell your doctor if symptoms do not start to get better after 1-2 days of treatment. This information is not intended to replace advice given to you by your health care provider. Make sure you discuss any questions you have with your health care provider. Document Revised: 12/31/2017 Document Reviewed: 12/31/2017 [employer] Patient Education  2020 [employer] Chronic Venous Insufficiency Chronic venous insufficiency is a condition where the leg veins cannot effectively pump blood from the legs to the heart. This happens when the vein walls are either  stretched, weakened, or damaged, or when the valves inside the vein are damaged. With the right treatment, you should be able to continue with an active life. This condition is also called venous stasis. What are the causes? Common causes of this condition include:  High blood pressure inside the veins (venous hypertension).  Sitting or standing too long, causing increased blood pressure in the leg veins.  A blood clot that blocks blood flow in a vein (deep vein thrombosis, DVT).  Inflammation of a vein (phlebitis) that causes a blood clot to form.  Tumors in the pelvis that cause blood to back up. What increases the risk? The following factors may make you more likely to develop this condition:  Having a family history of this condition.  Obesity.  Pregnancy.  Living without enough regular physical activity or exercise (sedentary lifestyle).  Smoking.  Having a job that requires long periods of standing or sitting in one place.  Being a certain age. Women in their 40s and 50s and men in their 70s are more likely to develop this condition. What are the signs or symptoms? Symptoms of this condition include:  Veins that are enlarged, bulging, or twisted (varicose veins).  Skin breakdown or ulcers.  Reddened skin or dark discoloration of skin on the leg between the knee and ankle.  Brown, smooth, tight, and painful skin just above the ankle, usually on the inside of the leg (lipodermatosclerosis).  Swelling of the legs. How is this diagnosed? This condition may be diagnosed based on:  Your medical history.  A physical exam.  Tests, such as: ? A procedure that creates an image of a blood vessel and nearby organs and provides information about blood flow through the blood vessel (duplex ultrasound). ? A procedure that tests blood flow (plethysmography). ? A procedure that looks at the veins using X-ray and dye (venogram). How is this treated? The goals of treatment are  to help you return to an active life and to minimize pain or disability. Treatment depends on the severity of your condition, and it may include:  Wearing compression stockings. These can help relieve symptoms and help prevent your condition from getting worse. However, they do not cure the condition.  Sclerotherapy. This procedure involves an injection of a solution that shrinks damaged veins.  Surgery. This may involve: ? Removing a diseased vein (vein stripping). ? Cutting off blood flow through the vein (laser ablation surgery). ? Repairing or reconstructing a valve within the affected vein. Follow these instructions at home:      Wear compression stockings as told by your health care provider. These stockings help to prevent blood clots and reduce swelling in your legs.  Take over-the-counter and prescription medicines only as told by your health care provider.  Stay active by exercising, walking, or doing different activities. Ask your health care provider what activities are safe for you and how much exercise you need.  Drink   enough fluid to keep your urine pale yellow.  Do not use any products that contain nicotine or tobacco, such as cigarettes, e-cigarettes, and chewing tobacco. If you need help quitting, ask your health care provider.  Keep all follow-up visits as told by your health care provider. This is important. Contact a health care provider if you:  Have redness, swelling, or more pain in the affected area.  See a red streak or line that goes up or down from the affected area.  Have skin breakdown or skin loss in the affected area, even if the breakdown is small.  Get an injury in the affected area. Get help right away if:  You get an injury and an open wound in the affected area.  You have: ? Severe pain that does not get better with medicine. ? Sudden numbness or weakness in the foot or ankle below the affected area. ? Trouble moving your foot or  ankle. ? A fever. ? Worse or persistent symptoms. ? Chest pain. ? Shortness of breath. Summary  Chronic venous insufficiency is a condition where the leg veins cannot effectively pump blood from the legs to the heart.  Chronic venous insufficiency occurs when the vein walls become stretched, weakened, or damaged, or when valves within the vein are damaged.  Treatment depends on how severe your condition is. It often involves wearing compression stockings and may involve having a procedure.  Make sure you stay active by exercising, walking, or doing different activities. Ask your health care provider what activities are safe for you and how much exercise you need. This information is not intended to replace advice given to you by your health care provider. Make sure you discuss any questions you have with your health care provider. Document Revised: 05/04/2018 Document Reviewed: 05/04/2018 [employer] Patient Education  2020 [employer]  

## 2020-05-09 NOTE — Discharge Summary (Signed)
Physician Discharge Summary  HUNNER GARCON CNO:[id number] DOB: 1941-03-30 DOA: 05/06/2020  [facility]  Admit date: 05/06/2020 Discharge date: 05/09/2020  Admitted From: Home Disposition: Home  Recommendations for Outpatient Follow-up:  1. Follow up with PCP in 1-2 weeks, follows with [facility], Dr. Aneta Mins 2. Continue antibiotics with clindamycin x10 days for lower extremity cellulitis 3. Referred to outpatient wound clinic for chronic venous stasis 4. Please obtain BMP in one week, started on Lasix to assist with fluid mobilization from lower extremities 5. Please follow up on the following pending results:  Home Health: Patient declined home health services Equipment/Devices: None  Discharge Condition: Stable CODE STATUS: Full code Diet recommendation: Heart healthy diet  History of present illness:  Eric Schultz is an 79 y.o. male with medical history significant ofhypertension, recurrent cellulitis,venous stasis, lymphedema, liver cirrhosis, rectal cancer, andmorbid obesitypresents for worsening bilateral leg swelling and pain. Seen initially on 9/3 for worsening swelling and redness of the left leg for which he was diagnosed with cellulitis treated with 1 dose of clindamycin IV andand prescribed doxycycline. Doppler ultrasounds obtained during that visitdid not show any signs of a DVT.Denied having any significant injury to onset symptoms.Patient reports that he has been taking doxycycline as prescribed with although reports still having a few days left of medication take. Complains of having stabbing and achy pain in both legs. Notes that his legs have been weeping clear yellow fluid.  TRH consulted for admission for failed outpatient cellulitis treatment.  Hospital course:  Cellulitis left lower extremity Lymphedema versus chronic venous stasis Patient presenting with complaints of worsening lower extremity edema, pain and erythema to  legs, left worse than right.  He has been on antibiotics with doxycycline since 04/27/2020 without improvement.  Vascular duplex ultrasound lower extremities negative for DVT.  Patient was started on empiric antibiotics with vancomycin.  Wound care was consulted with recommendations of gently washing the left lower extremity with soap and water, pat dry, apply moisturizing ointments and placement of Xeroform gauze over any weeping areas and cover with ABD pads and spiral wrap with Kerlix change daily.  Patient also encouraged to elevate lower extremities when seated.  We will continue antibiotic therapy outpatient with clindamycin to complete 10-day course.  Also will refer to outpatient wound center as may benefit from more aggressive wound care, consideration of Unna boot wrapping.  Also will discharge on furosemide 40 mg p.o. daily to assist with fluid mobilization.  Outpatient follow-up with PCP, recommend BMP at next visit.  Chronic diastolic congestive heart failure; compensated Last EF noted to be around 60 to 65% by echocardiogram in 02/2019.  Not on diuretics at home.  Patient with significant chronic lower extremity edema.  Will discharge home on Lasix 40 mg p.o. daily.  Recommend follow-up BMP in next PCP visit.  Encouraged low-salt diet.  Encourage patient to maintain daily weights.  Pancytopenia: Chronic.  Etiology likely secondary to patient's history of liver cirrhosis.  Counts remained stable during hospitalization.  Outpatient follow-up.  Liver cirrhosis: Chronic.  Patient denied any significant history of alcohol abuse. Previously negative for HIV and hepatitis C. Suspect fatty liver disease as likely cause.  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 He was fired by Dr. Burr Medico after his initial visit due to inappropriate comments. He declined need to be seen at this time and reports that he will go to [place] to be evaluated. Recommend patient establish  care with a new oncologist as soon  as possible. Dr. Ernst Bowler possibly patient being seen by Dr. Benay Spice, but did not appear that he wanted to be seen by their practice at this time  OSA:  Patient not on CPAP at home.  Morbid obesity:  Estimated body mass index is 41.85 kg/m as calculated from the following:   Height as of this encounter: 5\' 11"  (1.803 m).   Weight as of this encounter: 136.1 kg. Counseled on need for aggressive weight loss measures/lifestyle changes this complicates all facets of care.  Discharge Diagnoses:  Principal Problem:   Cellulitis Active Problems:   Chronic venous stasis dermatitis of both lower extremities   Rectal adenocarcinoma (HCC)   Pancytopenia (HCC)   Obesity, Class III, BMI 40-49.9 (morbid obesity) ([place])   Liver cirrhosis ([place])    Discharge Instructions  Discharge Instructions    Call MD for:  difficulty breathing, headache or visual disturbances   Complete by: As directed    Call MD for:  extreme fatigue   Complete by: As directed    Call MD for:  persistant dizziness or light-headedness   Complete by: As directed    Call MD for:  persistant nausea and vomiting   Complete by: As directed    Call MD for:  temperature >100.4   Complete by: As directed    Diet - low sodium heart healthy   Complete by: As directed    Discharge wound care:   Complete by: As directed    Wound care  Daily      Gently wash LLE with soap and water. Pat dry. Apply Sween Moisturizing Ointment . Place Xeroform gauze over any weeping areas. Cover with ABD pads. Spiral wrap kerlex to secure in place.   Increase activity slowly   Complete by: As directed      Allergies as of 05/09/2020      Reactions   Sulfa Antibiotics Nausea And Vomiting      Medication List    TAKE these medications   clindamycin 150 MG capsule Commonly known as: CLEOCIN Take 3 capsules (450 mg total) by mouth 3 (three) times daily for 10 days.   furosemide 40 MG  tablet Commonly known as: Lasix Take 1 tablet (40 mg total) by mouth daily.   HYDROcodone-acetaminophen 5-325 MG tablet Commonly known as: NORCO/VICODIN Take 1 tablet by mouth every 6 (six) hours as needed.   indomethacin 50 MG capsule Commonly known as: INDOCIN Take 1 capsule (50 mg total) by mouth 3 (three) times daily with meals for 7 days.   potassium chloride SA 20 MEQ tablet Commonly known as: KLOR-CON Take 1 tablet (20 mEq total) by mouth daily.            Discharge Care Instructions  (From admission, onward)         Start     Ordered   05/09/20 0000  Discharge wound care:       Comments: Wound care  Daily      Gently wash LLE with soap and water. Pat dry. Apply Sween Moisturizing Ointment . Place Xeroform gauze over any weeping areas. Cover with ABD pads. Spiral wrap kerlex to secure in place.   05/09/20 1025          Follow-up Information    [facility] Schedule an appointment as soon as possible for a visit in 1 week(s).   Contact information: [place] [address] [phone]              Allergies  Allergen Reactions  . Sulfa Antibiotics Nausea And Vomiting    Consultations:  None   Procedures/Studies: VAS [place] LOWER EXTREMITY VENOUS (DVT) (ONLY [facility] & [facility] 7a-7p)  Result Date: 04/29/2020  Lower Venous DVT Study Indications: Pain, Swelling, and Erythema.  Limitations: Body habitus and skin texture, pain with touch. Comparison       Prior negative study done 11/13/19 and is available for Study:           comparison. Performing Technologist: Sharion Dove RVS  Examination Guidelines: A complete evaluation includes B-mode imaging, spectral Doppler, color Doppler, and power Doppler as needed of all accessible portions of each vessel. Bilateral testing is considered an integral part of a complete examination. Limited examinations for reoccurring indications may be performed as noted. The reflux portion of the exam is performed  with the patient in reverse Trendelenburg.  +-----+---------------+---------+-----------+----------+--------------+ RIGHTCompressibilityPhasicitySpontaneityPropertiesThrombus Aging +-----+---------------+---------+-----------+----------+--------------+ CFV  Full           Yes      Yes                                 +-----+---------------+---------+-----------+----------+--------------+   +---------+---------------+---------+-----------+----------+--------------+ LEFT     CompressibilityPhasicitySpontaneityPropertiesThrombus Aging +---------+---------------+---------+-----------+----------+--------------+ CFV      Full           Yes      Yes                                 +---------+---------------+---------+-----------+----------+--------------+ SFJ      Full                                                        +---------+---------------+---------+-----------+----------+--------------+ FV Prox  Full                                                        +---------+---------------+---------+-----------+----------+--------------+ FV Mid   Full                                                        +---------+---------------+---------+-----------+----------+--------------+ FV DistalFull           Yes      Yes                                 +---------+---------------+---------+-----------+----------+--------------+ PFV      Full                                                        +---------+---------------+---------+-----------+----------+--------------+ POP      Full           Yes      Yes                                 +---------+---------------+---------+-----------+----------+--------------+  PTV      Full           Yes      Yes                                 +---------+---------------+---------+-----------+----------+--------------+ PERO                                                  Not visualized  +---------+---------------+---------+-----------+----------+--------------+     Summary: RIGHT: - No evidence of common femoral vein obstruction.  LEFT: - Findings appear essentially unchanged compared to previous examination. - There is no evidence of deep vein thrombosis in the lower extremity. However, portions of this examination were limited- see technologist comments above.  - Ultrasound characteristics of enlarged lymph nodes noted in the groin.  *See table(s) above for measurements and observations. Electronically signed by Ruta Hinds MD on 04/29/2020 at 10:05:10 AM.    Final       Subjective: Patient seen and examined at bedside, resting comfortably in bedside chair.  Eating breakfast.  States he would like to discharge home today.  Discussed with patient needs for compliance with diet, weight loss, especially watching sodium intake.  Also discussed need for leg elevation for his chronic venous stasis/edema.  Patient does not wish for any home health services.  Will follow up with [facility] physician at the [facility] and refuses other PCP assistance at this time.  Denies headache, no fever/chills/night sweats, no nausea/vomiting/diarrhea, no chest pain, no palpitations, no shortness of breath, no abdominal pain, no weakness, no fatigue, no paresthesias.  No acute events overnight per nursing staff.  Discharge Exam: Vitals:   05/08/20 1958 05/09/20 0906  BP: (!) 154/83 137/71  Pulse: 62 (!) 57  Resp: 18 17  Temp:  97.8 F (36.6 C)  SpO2: 94% 95%   Vitals:   05/08/20 0742 05/08/20 1451 05/08/20 1958 05/09/20 0906  BP: (!) 151/94 138/67 (!) 154/83 137/71  Pulse: (!) 58 64 62 (!) 57  Resp: 14 17 18 17   Temp: 98.2 F (36.8 C) 98.1 F (36.7 C)  97.8 F (36.6 C)  TempSrc: Oral Oral  Oral  SpO2: 93% 98% 94% 95%  Weight:      Height:        General: Pt is alert, awake, not in acute distress, obese Cardiovascular: RRR, S1/S2 +, no rubs, no gallops Respiratory: CTA bilaterally, no wheezing, no  rhonchi Abdominal: Soft, NT, ND, bowel sounds + Extremities: Chronic lymphedema bilateral lower extremities to knee, left lower extremity with dressing in place that is clean/dry/intact          The results of significant diagnostics from this hospitalization (including imaging, microbiology, ancillary and laboratory) are listed below for reference.     Microbiology: Recent Results (from the past 240 hour(s))  SARS Coronavirus 2 by RT PCR (hospital order, performed in [facility] hospital lab) Nasopharyngeal Nasopharyngeal Swab     Status: None   Collection Time: 05/07/20  5:57 AM   Specimen: Nasopharyngeal Swab  Result Value Ref Range Status   SARS Coronavirus 2 NEGATIVE NEGATIVE Final    Comment: (NOTE) SARS-CoV-2 target nucleic acids are NOT DETECTED.  The SARS-CoV-2 RNA is generally detectable in upper and lower respiratory specimens during the acute phase of infection. The  lowest concentration of SARS-CoV-2 viral copies this assay can detect is 250 copies / mL. A negative result does not preclude SARS-CoV-2 infection and should not be used as the sole basis for treatment or other patient management decisions.  A negative result may occur with improper specimen collection / handling, submission of specimen other than nasopharyngeal swab, presence of viral mutation(s) within the areas targeted by this assay, and inadequate number of viral copies (<250 copies / mL). A negative result must be combined with clinical observations, patient history, and epidemiological information.  Fact Sheet for Patients:   StrictlyIdeas.no  Fact Sheet for Healthcare Providers: BankingDealers.co.za  This test is not yet approved or  cleared by the [place] FDA and has been authorized for detection and/or diagnosis of SARS-CoV-2 by FDA under an Emergency Use Authorization (EUA).  This EUA will remain in effect (meaning this test can be  used) for the duration of the COVID-19 declaration under Section 564(b)(1) of the Act, 21 U.S.C. section 360bbb-3(b)(1), unless the authorization is terminated or revoked sooner.  Performed at [facility], [place] [address] [place], [place] [address]   Culture, blood (routine x 2)     Status: None (Preliminary result)   Collection Time: 05/07/20  8:00 PM   Specimen: BLOOD LEFT ARM  Result Value Ref Range Status   Specimen Description BLOOD LEFT ARM  Final   Special Requests   Final    BOTTLES DRAWN AEROBIC AND ANAEROBIC Blood Culture adequate volume   Culture   Final    NO GROWTH < 24 HOURS Performed at [facility], [place] [address] [place], [place] [address]    Report Status PENDING  Incomplete  Culture, blood (routine x 2)     Status: None (Preliminary result)   Collection Time: 05/07/20  8:16 PM   Specimen: BLOOD RIGHT HAND  Result Value Ref Range Status   Specimen Description BLOOD RIGHT HAND  Final   Special Requests   Final    BOTTLES DRAWN AEROBIC AND ANAEROBIC Blood Culture adequate volume   Culture   Final    NO GROWTH < 24 HOURS Performed at [facility], [place] [address] [place], [place] [address]    Report Status PENDING  Incomplete     Labs: BNP (last 3 results) Recent Labs    05/07/20 1911  BNP 99.3   Basic Metabolic Panel: Recent Labs  Lab 05/07/20 0650 05/08/20 0550 05/09/20 0451  NA 141 141 140  K 3.5 3.6 3.4*  CL 108 108 108  CO2 26 26 25   GLUCOSE 94 94 93  BUN 11 11 14   CREATININE 0.91 0.97 0.95  CALCIUM 8.5* 8.4* 8.5*   Liver Function Tests: No results for input(s): AST, ALT, ALKPHOS, BILITOT, PROT, ALBUMIN in the last 168 hours. No results for input(s): LIPASE, AMYLASE in the last 168 hours. No results for input(s): AMMONIA in the last 168 hours. CBC: Recent Labs  Lab 05/07/20 0650 05/08/20 0550 05/09/20 0451  WBC 3.5* 4.0 3.7*  NEUTROABS 2.0  --   --   HGB 11.5* 10.8* 11.6*  HCT 34.3* 32.2* 34.4*  MCV 100.0  97.0 96.9  PLT 94* 92* 88*   Cardiac Enzymes: No results for input(s): CKTOTAL, CKMB, CKMBINDEX, TROPONINI in the last 168 hours. BNP: Invalid input(s): POCBNP CBG: No results for input(s): GLUCAP in the last 168 hours. D-Dimer No results for input(s): DDIMER in the last 72 hours. Hgb A1c No results for input(s): HGBA1C  in the last 72 hours. Lipid Profile No results for input(s): CHOL, HDL, LDLCALC, TRIG, CHOLHDL, LDLDIRECT in the last 72 hours. Thyroid function studies No results for input(s): TSH, T4TOTAL, T3FREE, THYROIDAB in the last 72 hours.  Invalid input(s): FREET3 Anemia work up No results for input(s): VITAMINB12, FOLATE, FERRITIN, TIBC, IRON, RETICCTPCT in the last 72 hours. Urinalysis    Component Value Date/Time   COLORURINE YELLOW 03/20/2019 2252   APPEARANCEUR CLEAR 03/20/2019 2252   LABSPEC 1.020 03/20/2019 2252   PHURINE 5.0 03/20/2019 2252   GLUCOSEU NEGATIVE 03/20/2019 2252   HGBUR SMALL (A) 03/20/2019 2252   BILIRUBINUR NEGATIVE 03/20/2019 2252   KETONESUR NEGATIVE 03/20/2019 2252   PROTEINUR NEGATIVE 03/20/2019 2252   UROBILINOGEN 0.2 02/09/2014 1954   NITRITE NEGATIVE 03/20/2019 2252   LEUKOCYTESUR NEGATIVE 03/20/2019 2252   Sepsis Labs Invalid input(s): PROCALCITONIN,  WBC,  LACTICIDVEN Microbiology Recent Results (from the past 240 hour(s))  SARS Coronavirus 2 by RT PCR (hospital order, performed in [place] hospital lab) Nasopharyngeal Nasopharyngeal Swab     Status: None   Collection Time: 05/07/20  5:57 AM   Specimen: Nasopharyngeal Swab  Result Value Ref Range Status   SARS Coronavirus 2 NEGATIVE NEGATIVE Final    Comment: (NOTE) SARS-CoV-2 target nucleic acids are NOT DETECTED.  The SARS-CoV-2 RNA is generally detectable in upper and lower respiratory specimens during the acute phase of infection. The lowest concentration of SARS-CoV-2 viral copies this assay can detect is 250 copies / mL. A negative result does not preclude  SARS-CoV-2 infection and should not be used as the sole basis for treatment or other patient management decisions.  A negative result may occur with improper specimen collection / handling, submission of specimen other than nasopharyngeal swab, presence of viral mutation(s) within the areas targeted by this assay, and inadequate number of viral copies (<250 copies / mL). A negative result must be combined with clinical observations, patient history, and epidemiological information.  Fact Sheet for Patients:   StrictlyIdeas.no  Fact Sheet for Healthcare Providers: BankingDealers.co.za  This test is not yet approved or  cleared by the [place] FDA and has been authorized for detection and/or diagnosis of SARS-CoV-2 by FDA under an Emergency Use Authorization (EUA).  This EUA will remain in effect (meaning this test can be used) for the duration of the COVID-19 declaration under Section 564(b)(1) of the Act, 21 U.S.C. section 360bbb-3(b)(1), unless the authorization is terminated or revoked sooner.  Performed at [facility], [place] [address] [place], [place] [address]   Culture, blood (routine x 2)     Status: None (Preliminary result)   Collection Time: 05/07/20  8:00 PM   Specimen: BLOOD LEFT ARM  Result Value Ref Range Status   Specimen Description BLOOD LEFT ARM  Final   Special Requests   Final    BOTTLES DRAWN AEROBIC AND ANAEROBIC Blood Culture adequate volume   Culture   Final    NO GROWTH < 24 HOURS Performed at [facility], [place] [address] [place], [place] [address]    Report Status PENDING  Incomplete  Culture, blood (routine x 2)     Status: None (Preliminary result)   Collection Time: 05/07/20  8:16 PM   Specimen: BLOOD RIGHT HAND  Result Value Ref Range Status   Specimen Description BLOOD RIGHT HAND  Final   Special Requests   Final    BOTTLES DRAWN AEROBIC AND ANAEROBIC Blood Culture adequate  volume   Culture  Final    NO GROWTH < 24 HOURS Performed at [facility], [place] [address], [place], [place] [address]    Report Status PENDING  Incomplete     Time coordinating discharge: Over 30 minutes  SIGNED:   Kharter Brew J [place] DO  [facility] 05/09/2020, 10:26 AM

## 2020-05-09 NOTE — Progress Notes (Addendum)
Discharge summary packet provided to pt with instructions. Pt verbalized understanding of instructions, all questions and concerns were fully answered. NO complaints voicedPt  Alert /oriented in no apparent distress.D/C to home as ordered. Pt is responsible for his own dirve back home.

## 2020-05-12 LAB — CULTURE, BLOOD (ROUTINE X 2)
Culture: NO GROWTH
Culture: NO GROWTH
Special Requests: ADEQUATE
Special Requests: ADEQUATE

## 2020-05-30 ENCOUNTER — Ambulatory Visit (HOSPITAL_COMMUNITY)
Admission: EM | Admit: 2020-05-30 | Discharge: 2020-05-30 | Disposition: A | Discharge status: Home / Self Care | Payer: Medicare PPO | Attending: Family Medicine | Admitting: Family Medicine

## 2020-05-30 ENCOUNTER — Encounter (HOSPITAL_COMMUNITY): Payer: Self-pay | Admitting: Emergency Medicine

## 2020-05-30 ENCOUNTER — Other Ambulatory Visit: Payer: Self-pay

## 2020-05-30 DIAGNOSIS — I890 Lymphedema, not elsewhere classified: Secondary | ICD-10-CM

## 2020-05-30 DIAGNOSIS — M542 Cervicalgia: Secondary | ICD-10-CM

## 2020-05-30 MED ORDER — HYDROCODONE-ACETAMINOPHEN 5-325 MG PO TABS: 1.0000 | ORAL_TABLET | Freq: Four times a day (QID) | ORAL | 0 refills | Status: AC | PRN

## 2020-05-30 NOTE — ED Notes (Signed)
Ortho tech is in department 

## 2020-05-30 NOTE — Discharge Instructions (Addendum)

## 2020-05-30 NOTE — ED Notes (Signed)
Left message for ortho tech.

## 2020-05-30 NOTE — Progress Notes (Signed)
Orthopedic Tech Progress Note Patient Details:  Eric Schultz March 21, 1941 [id number] Spoke with patient about coming back on Saturday to have a new UNNA BOOT applied but RN stated patient did have wound care follow  Up orders. Ortho Devices Type of Ortho Device: Haematologist Ortho Device/Splint Location: LLE Ortho Device/Splint Interventions: Ordered, Application   Post Interventions Patient Tolerated: Well Instructions Provided: [facility] [name] 05/30/2020, 5:45 PM

## 2020-05-30 NOTE — ED Triage Notes (Signed)
Pt c/o neck pain in the back of the head that has been hurting about 2 months. The neck pain is worse with ROM. He states he was taking a pain medicine which helped but he ran out. Pt states he was recently in the hospital for cellulitis on both legs that he is asking to be rechecked and rewrapped. He states he cannot reach his legs.

## 2020-06-02 NOTE — ED Provider Notes (Signed)
[place]   [id number] 05/30/20 Arrival Time: 1941  ASSESSMENT & PLAN:  1. Lymphedema   2. Chronic neck pain     Both chronic issues with recent exacerbation. Stressed importance of calling wound care for f/u asap.  Meds ordered this encounter  Medications  . HYDROcodone-acetaminophen (NORCO/VICODIN) 5-325 MG tablet    Sig: Take 1 tablet by mouth every 6 (six) hours as needed.    Dispense:  15 tablet    Refill:  0    Orders Placed This Encounter  Procedures  . Apply [employer]    Recommend:  Follow-up Information    Schedule an appointment as soon as possible for a visit  with [facility].   Specialty: Wound Care Contact information: [place], [address] [id number] [address] [address].   Specialty: Emergency Medicine Why: If symptoms worsen in any way. Contact information: [address] [id number] [place] [phone]                [place] Controlled Substances Registry consulted for this patient. I feel the risk/benefit ratio today is favorable for proceeding with this prescription for a controlled substance. Medication sedation precautions given.  Reviewed expectations re: course of current medical issues. Questions answered. Outlined signs and symptoms indicating need for more acute intervention. Patient verbalized understanding. After Visit Summary given.  SUBJECTIVE: History from: patient. Eric Schultz is a 79 y.o. male who reports recent exacerbation of chronic neck pain and LLE lymphedema. Unna boot helped at last visit. Has not scheduled f/u with wound care. Neck is sore but unchanged from chronic pain. No extremity sensation changes or weakness.    History reviewed. No pertinent surgical history.    OBJECTIVE:  Vitals:   05/30/20 1536  BP: 138/74  Pulse: 60  Resp: 20  Temp:  98.2 F (36.8 C)  TempSrc: Oral  SpO2: 98%    General appearance: alert; no distress HEENT: [place]; AT Neck: supple with FROM Resp: unlabored respirations Extremities: . LLE: warm with well perfused appearance; significant lymphedema with chronic skin changes; seeping clear fluid at places; no signs of infection Psychological: alert and cooperative; normal mood and affect    Allergies  Allergen Reactions  . Sulfa Antibiotics Nausea And Vomiting    Past Medical History:  Diagnosis Date  . Allergy   . Cellulitis    left lower leg  . Cellulitis and abscess of left leg 05/09/2020  . Gout   . HOH (hard of hearing)   . Hypertension    "borderline"  . Rectal cancer ([place])   . Sleep apnea    Social History   Socioeconomic History  . Marital status: Single    Spouse name: Not on file  . Number of children: 0  . Years of education: Not on file  . Highest education level: Not on file  Occupational History  . Occupation: retired Barista   Tobacco Use  . Smoking status: Never Smoker  . Smokeless tobacco: Never Used  Vaping Use  . Vaping Use: Never used  Substance and Sexual Activity  . Alcohol use: No  . Drug use: No  . Sexual activity: Not on file  Other Topics Concern  . Not on file  Social History Narrative   Marital status: single      Children: none      Employment: retired;  Tobacco: none      Alcohol: none         Social Determinants of Health   Financial Resource Strain:   . Difficulty of Paying Living Expenses: Not on file  Food Insecurity:   . Worried About Charity fundraiser in the Last Year: Not on file  . Ran Out of Food in the Last Year: Not on file  Transportation Needs:   . Lack of Transportation (Medical): Not on file  . Lack of Transportation (Non-Medical): Not on file  Physical Activity:   . Days of Exercise per Week: Not on file  . Minutes of Exercise per Session: Not on file  Stress:   . Feeling of Stress : Not on file   Social Connections:   . Frequency of Communication with Friends and Family: Not on file  . Frequency of Social Gatherings with Friends and Family: Not on file  . Attends Religious Services: Not on file  . Active Member of Clubs or Organizations: Not on file  . Attends Archivist Meetings: Not on file  . Marital Status: Not on file   Family History  Problem Relation Age of Onset  . Heart failure Mother   . Heart failure Father   . Stroke Father   . Colon cancer Neg Hx   . Esophageal cancer Neg Hx   . Rectal cancer Neg Hx   . Stomach cancer Neg Hx    History reviewed. No pertinent surgical history.    Vanessa Kick, MD 06/02/20 1046

## 2020-06-03 ENCOUNTER — Encounter (HOSPITAL_COMMUNITY): Payer: Self-pay

## 2020-06-03 ENCOUNTER — Other Ambulatory Visit: Payer: Self-pay

## 2020-06-03 ENCOUNTER — Ambulatory Visit (HOSPITAL_COMMUNITY)
Admission: EM | Admit: 2020-06-03 | Discharge: 2020-06-03 | Disposition: A | Discharge status: Home / Self Care | Payer: Medicare PPO | Attending: Family Medicine | Admitting: Family Medicine

## 2020-06-03 DIAGNOSIS — L03116 Cellulitis of left lower limb: Secondary | ICD-10-CM

## 2020-06-03 DIAGNOSIS — I878 Other specified disorders of veins: Secondary | ICD-10-CM | POA: Diagnosis not present

## 2020-06-03 MED ORDER — CLINDAMYCIN HCL 150 MG PO CAPS: 150.0000 mg | ORAL_CAPSULE | Freq: Two times a day (BID) | ORAL | 0 refills | Status: DC

## 2020-06-03 NOTE — ED Notes (Addendum)
Called ortho tech, Nadonna,  for placement of unna boot to left leg; eta 10 minutes.

## 2020-06-03 NOTE — Discharge Instructions (Signed)
Call [place] as soon as able to make an appointment [phone] [place] [address] [facility] [place]    Vascular and Vein Specialist [place], [place]

## 2020-06-03 NOTE — ED Provider Notes (Signed)
[place]    CSN: [id number] Arrival date & time: 06/03/20  1426      History   Chief Complaint Chief Complaint  Patient presents with  . Neck Pain  . Foot Pain    HPI Eric Schultz is a 79 y.o. male.   Here today for re-evaluation of severe venous stasis of left lower leg with scaling, weeping, cracking of skin and odor. This has been an ongoing issue including a recent hospitalization for same. Denies recent fevers, pain is at his baseline since onset. Was referred to wound care from hospital last month but has been unable to get an appt with them at this time. Has now in the past month been on several different antibiotics which only help temporarily. Has had several unna boots lately which he does feel help. Trying to keep area clean at home as best he can.      Past Medical History:  Diagnosis Date  . Allergy   . Cellulitis    left lower leg  . Cellulitis and abscess of left leg 05/09/2020  . Gout   . HOH (hard of hearing)   . Hypertension    "borderline"  . Rectal cancer ([place])   . Sleep apnea     Patient Active Problem List   Diagnosis Date Noted  . Cellulitis 05/07/2020  . Pancytopenia ([place]) 05/07/2020  . Obesity, Class III, BMI 40-49.9 (morbid obesity) ([place]) 05/07/2020  . Liver cirrhosis ([place]) 05/07/2020  . Rectal adenocarcinoma ([place]) 02/29/2020  . Cellulitis of foot, left 03/21/2019  . Chronic venous stasis dermatitis of both lower extremities 03/21/2019  . AKI (acute kidney injury) ([place]) 03/21/2019  . Acute on chronic respiratory failure with hypoxia ([place]) 03/21/2019    History reviewed. No pertinent surgical history.     Home Medications    Prior to Admission medications   Medication Sig Start Date End Date Taking? Authorizing Provider  furosemide (LASIX) 40 MG tablet Take 1 tablet (40 mg total) by mouth daily. 05/09/20 05/09/21 Yes [place] Eric J, DO  potassium chloride SA (KLOR-CON) 20 MEQ tablet Take 1 tablet (20 mEq total)  by mouth daily. 04/28/20  Yes Malvin Johns, MD  clindamycin (CLEOCIN) 150 MG capsule Take 1 capsule (150 mg total) by mouth 2 (two) times daily. 06/03/20   Volney American, PA-C  HYDROcodone-acetaminophen (NORCO/VICODIN) 5-325 MG tablet Take 1 tablet by mouth every 6 (six) hours as needed. 05/30/20   Vanessa Kick, MD    Family History Family History  Problem Relation Age of Onset  . Heart failure Mother   . Heart failure Father   . Stroke Father   . Colon cancer Neg Hx   . Esophageal cancer Neg Hx   . Rectal cancer Neg Hx   . Stomach cancer Neg Hx     Social History Social History   Tobacco Use  . Smoking status: Never Smoker  . Smokeless tobacco: Never Used  Vaping Use  . Vaping Use: Never used  Substance Use Topics  . Alcohol use: No  . Drug use: No     Allergies   Sulfa antibiotics   Review of Systems Review of Systems PER HPI    Physical Exam Triage Vital Signs ED Triage Vitals  Enc Vitals Group     BP 06/03/20 1603 135/80     Pulse Rate 06/03/20 1603 (!) 57     Resp 06/03/20 1603 18     Temp 06/03/20 1603 97.9 F (36.6 C)  Temp Source 06/03/20 1603 Oral     SpO2 06/03/20 1603 95 %     Weight --      Height --      Head Circumference --      Peak Flow --      Pain Score 06/03/20 1613 3     Pain Loc --      Pain Edu? --      Excl. in [place]? --    No data found.  Updated Vital Signs BP (!) 144/79 (BP Location: Left Arm)   Pulse (!) 57   Temp 97.9 F (36.6 C) (Oral)   Resp 18   SpO2 95%   Visual Acuity Right Eye Distance:   Left Eye Distance:   Bilateral Distance:    Right Eye Near:   Left Eye Near:    Bilateral Near:     Physical Exam Vitals and nursing note reviewed.  Constitutional:      Appearance: Normal appearance. He is obese.  HENT:     Head: Atraumatic.  Eyes:     Extraocular Movements: Extraocular movements intact.     Conjunctiva/sclera: Conjunctivae normal.  Cardiovascular:     Rate and Rhythm: Normal rate  and regular rhythm.  Pulmonary:     Effort: Pulmonary effort is normal.     Breath sounds: Normal breath sounds.  Musculoskeletal:        General: Swelling (severe swelling and skin changes of left lower leg) present. Normal range of motion.     Cervical back: Normal range of motion and neck supple.  Skin:    Comments: Left lower leg diffusely scaly, tight, cracking with new open area at ankle with surrounding erythema and drainage. Entire leg is weeping clear and yellow fluid additionally  Neurological:     General: No focal deficit present.     Mental Status: He is oriented to person, place, and time.  Psychiatric:        Mood and Affect: Mood normal.        Thought Content: Thought content normal.        Judgment: Judgment normal.      UC Treatments / Results  Labs (all labs ordered are listed, but only abnormal results are displayed) Labs Reviewed - No data to display  EKG   Radiology No results found.  Procedures Procedures (including critical care time)  Medications Ordered in UC Medications - No data to display  Initial Impression / Assessment and Plan / UC Course  I have reviewed the triage vital signs and the nursing notes.  Pertinent labs & imaging results that were available during my care of the patient were reviewed by me and considered in my medical decision making (see chart for details).     New area at ankle appears acutely infected so will cover with clindamycin to hopefully bridge until he can get in with wound care hopefully soon. Do think he would benefit from a Vascular Specialist as well given severity of disease and numerous hospital visits for the same. Unna boot placed today and home care instructions given.   Final Clinical Impressions(s) / UC Diagnoses   Final diagnoses:  Chronic venous stasis  Cellulitis of left lower extremity     Discharge Instructions     Call [place] as soon as able to make an  appointment [phone] [place] [address] [facility]    Vascular and Vein Specialist [phone] [address], [place]  ED Prescriptions    Medication Sig Dispense Auth. Provider   clindamycin (CLEOCIN) 150 MG capsule Take 1 capsule (150 mg total) by mouth 2 (two) times daily. 14 capsule Volney American, [place]     PDMP not reviewed this encounter.   Volney American, [place] 06/03/20 1736

## 2020-06-03 NOTE — ED Notes (Signed)
Applied silvadene to left lateral ankle wound. Await ortho tech.

## 2020-06-03 NOTE — Progress Notes (Signed)
Orthopedic Tech Progress Note Patient Details:  Eric Schultz [birth year] [id number]  Ortho Devices Type of Ortho Device: Haematologist Ortho Device/Splint Location: Left Lower Extremity Ortho Device/Splint Interventions: Ordered, Application   Post Interventions Patient Tolerated: Well Instructions Provided: Adjustment of device, Care of device, Poper ambulation with device   [name] [name] 06/03/2020, 5:28 PM

## 2020-06-03 NOTE — ED Triage Notes (Signed)
Pt c/o right posterior neck pain for approx 1 month, left foot pain, swelling.  Left lower leg/ankle significantly edematous, rough skin, wound to left ankle leaking foul-smelling fluid.   Denies fever, n/v/d.

## 2020-06-17 ENCOUNTER — Other Ambulatory Visit: Payer: Self-pay

## 2020-06-17 ENCOUNTER — Encounter (HOSPITAL_COMMUNITY): Payer: Self-pay | Admitting: Emergency Medicine

## 2020-06-17 ENCOUNTER — Emergency Department (HOSPITAL_COMMUNITY)
Admission: EM | Admit: 2020-06-17 | Discharge: 2020-06-17 | Disposition: A | Discharge status: Home / Self Care | Payer: No Typology Code available for payment source | Attending: Emergency Medicine | Admitting: Emergency Medicine

## 2020-06-17 DIAGNOSIS — Z79899 Other long term (current) drug therapy: Secondary | ICD-10-CM | POA: Diagnosis not present

## 2020-06-17 DIAGNOSIS — Z85048 Personal history of other malignant neoplasm of rectum, rectosigmoid junction, and anus: Secondary | ICD-10-CM | POA: Insufficient documentation

## 2020-06-17 DIAGNOSIS — I10 Essential (primary) hypertension: Secondary | ICD-10-CM | POA: Insufficient documentation

## 2020-06-17 DIAGNOSIS — R2242 Localized swelling, mass and lump, left lower limb: Secondary | ICD-10-CM | POA: Diagnosis present

## 2020-06-17 DIAGNOSIS — I878 Other specified disorders of veins: Secondary | ICD-10-CM | POA: Insufficient documentation

## 2020-06-17 NOTE — Discharge Instructions (Addendum)
Please continue to seek care for your legs swelling through the wound center and your vein specialist.

## 2020-06-17 NOTE — ED Triage Notes (Signed)
Pt requested bandage changes for lower extremities, pt reports he is suppose to have bandages changed every 4 days but is unwilling to advise staff who gave those instructions. Bandage to L lower leg saturated, R lower leg weeping. Foul odor noted. Both LE hard to touch with orange peel appearance.

## 2020-06-17 NOTE — ED Provider Notes (Signed)
[place] EMERGENCY DEPARTMENT Provider Note   CSN: [id number] Arrival date & time: 06/17/20  0546     History Chief Complaint  Patient presents with  . Leg Swelling    Eric Schultz is a 79 y.o. male.  The history is provided by the patient and medical records. No language interpreter was used.     79 year old morbidly obese male significant history of chronic venous stasis involving both his lower extremities presenting requesting for leg wrapped.  Patient states he recently went through a bout of cellulitis involving his right lower extremities.  States that is when he was discharged from the hospital, his right leg was not wrapped like it should.  He is here requesting to see if we can his legs.  He also felt a sharp pain to his right ankle this morning and thought that something may have bit him.  He would like to have his ankle assessed.  He denies any recent injury.  Does not claim any fever chest pain trouble breathing or new numbness.  He thought the likely coming from a mouse as he has seen mice in his house.  Pain is rated as a 6 out of 10, localized to the medial ankle, nonradiating without any numbness.  Pt was seen 2 weeks ago for complaint of left lower leg and received clindamycin as treatment for potential cellulitis.   Past Medical History:  Diagnosis Date  . Allergy   . Cellulitis    left lower leg  . Cellulitis and abscess of left leg 05/09/2020  . Gout   . HOH (hard of hearing)   . Hypertension    "borderline"  . Rectal cancer ([place])   . Sleep apnea     Patient Active Problem List   Diagnosis Date Noted  . Cellulitis 05/07/2020  . Pancytopenia ([place]) 05/07/2020  . Obesity, Class III, BMI 40-49.9 (morbid obesity) ([place]) 05/07/2020  . Liver cirrhosis ([place]) 05/07/2020  . Rectal adenocarcinoma ([place]) 02/29/2020  . Cellulitis of foot, left 03/21/2019  . Chronic venous stasis dermatitis of both lower extremities 03/21/2019  . AKI (acute  kidney injury) ([place]) 03/21/2019  . Acute on chronic respiratory failure with hypoxia ([place]) 03/21/2019    History reviewed. No pertinent surgical history.     Family History  Problem Relation Age of Onset  . Heart failure Mother   . Heart failure Father   . Stroke Father   . Colon cancer Neg Hx   . Esophageal cancer Neg Hx   . Rectal cancer Neg Hx   . Stomach cancer Neg Hx     Social History   Tobacco Use  . Smoking status: Never Smoker  . Smokeless tobacco: Never Used  Vaping Use  . Vaping Use: Never used  Substance Use Topics  . Alcohol use: No  . Drug use: No    Home Medications Prior to Admission medications   Medication Sig Start Date End Date Taking? Authorizing Provider  clindamycin (CLEOCIN) 150 MG capsule Take 1 capsule (150 mg total) by mouth 2 (two) times daily. 06/03/20   Volney American, PA-C  furosemide (LASIX) 40 MG tablet Take 1 tablet (40 mg total) by mouth daily. 05/09/20 05/09/21  [place] Eric J, DO  HYDROcodone-acetaminophen (NORCO/VICODIN) 5-325 MG tablet Take 1 tablet by mouth every 6 (six) hours as needed. 05/30/20   Vanessa Kick, MD  potassium chloride SA (KLOR-CON) 20 MEQ tablet Take 1 tablet (20 mEq total) by mouth daily. 04/28/20  Malvin Johns, MD    Allergies    Sulfa antibiotics  Review of Systems   Review of Systems  All other systems reviewed and are negative.   Physical Exam Updated Vital Signs BP 105/86   Pulse 73   Temp 98 F (36.7 C) (Oral)   Resp 16   Ht 5\' 11"  (1.803 m)   Wt 133.8 kg   SpO2 98%   BMI 41.14 kg/m   Physical Exam Vitals and nursing note reviewed.  Constitutional:      General: He is not in acute distress.    Appearance: He is well-developed. He is obese.  HENT:     Head: Atraumatic.  Eyes:     Conjunctiva/sclera: Conjunctivae normal.  Musculoskeletal:     Cervical back: Neck supple.  Skin:    Findings: No rash.     Comments: Bilateral lower extremities: Evidence of scaly hyperpigmented  skin changes to bilateral lower extremities consistent with chronic venous stasis.  Left lower extremity larger than right.  There are some small skin separation noted to right lower extremity medially without any puncture wound no obvious bite mark.  No significant erythema appreciated.  DP pulse palpable bilaterally.  Legs are not warm to the touch.  Neurological:     Mental Status: He is alert.     ED Results / Procedures / Treatments   Labs (all labs ordered are listed, but only abnormal results are displayed) Labs Reviewed - No data to display  EKG None  Radiology No results found.  Procedures Procedures (including critical care time)  Medications Ordered in ED Medications - No data to display  ED Course  I have reviewed the triage vital signs and the nursing notes.  Pertinent labs & imaging results that were available during my care of the patient were reviewed by me and considered in my medical decision making (see chart for details).    MDM Rules/Calculators/A&P                          BP 140/82   Pulse 66   Temp 98 F (36.7 C) (Oral)   Resp 16   Ht 5\' 11"  (1.803 m)   Wt 133.8 kg   SpO2 100%   BMI 41.14 kg/m   Final Clinical Impression(s) / ED Diagnoses Final diagnoses:  Chronic venous stasis    Rx / DC Orders ED Discharge Orders    None     7:00 AM Patient felt a sharp pain to his right ankle this morning and thought he may have been bitten by an animal perhaps a mouse.  Examination of his right lower extremity without any obvious bite mark or puncture wounds.  There are some mild skin separation vertically along his medial right lower extremity likely from his edema and not likely from a bite mark.  He does have chronic venous stasis and also request to have his legs wrapped.  There is no overt cellulitic skin changes on exam.  He is overall not in any acute discomfort.  Will provide leg wrapping for comfort and encourage patient to follow-up  outpatient with PCP for further care.  Care discussed with Dr. Ward Givens, Alexis, PA-C 06/17/20 0750    Long, Wonda Old[place], MD 06/17/20 [phone]

## 2020-07-05 ENCOUNTER — Encounter (HOSPITAL_BASED_OUTPATIENT_CLINIC_OR_DEPARTMENT_OTHER): Payer: Medicare PPO | Attending: Internal Medicine | Admitting: Internal Medicine

## 2020-09-05 ENCOUNTER — Observation Stay (HOSPITAL_COMMUNITY): Payer: No Typology Code available for payment source

## 2020-09-05 ENCOUNTER — Encounter (HOSPITAL_COMMUNITY): Payer: Self-pay | Admitting: Emergency Medicine

## 2020-09-05 ENCOUNTER — Emergency Department (HOSPITAL_COMMUNITY): Payer: No Typology Code available for payment source

## 2020-09-05 ENCOUNTER — Observation Stay (HOSPITAL_COMMUNITY)
Admission: EM | Admit: 2020-09-05 | Discharge: 2020-09-06 | Disposition: A | Discharge status: Home / Self Care | Payer: No Typology Code available for payment source | Attending: Emergency Medicine | Admitting: Emergency Medicine

## 2020-09-05 DIAGNOSIS — L03115 Cellulitis of right lower limb: Secondary | ICD-10-CM | POA: Diagnosis not present

## 2020-09-05 DIAGNOSIS — R197 Diarrhea, unspecified: Secondary | ICD-10-CM | POA: Diagnosis not present

## 2020-09-05 DIAGNOSIS — U071 COVID-19: Secondary | ICD-10-CM | POA: Diagnosis not present

## 2020-09-05 DIAGNOSIS — D61818 Other pancytopenia: Secondary | ICD-10-CM | POA: Diagnosis not present

## 2020-09-05 DIAGNOSIS — B9689 Other specified bacterial agents as the cause of diseases classified elsewhere: Secondary | ICD-10-CM | POA: Insufficient documentation

## 2020-09-05 DIAGNOSIS — C20 Malignant neoplasm of rectum: Secondary | ICD-10-CM | POA: Diagnosis present

## 2020-09-05 DIAGNOSIS — I517 Cardiomegaly: Secondary | ICD-10-CM | POA: Diagnosis not present

## 2020-09-05 DIAGNOSIS — L03116 Cellulitis of left lower limb: Secondary | ICD-10-CM | POA: Diagnosis not present

## 2020-09-05 DIAGNOSIS — Z79899 Other long term (current) drug therapy: Secondary | ICD-10-CM | POA: Insufficient documentation

## 2020-09-05 DIAGNOSIS — J9601 Acute respiratory failure with hypoxia: Secondary | ICD-10-CM | POA: Diagnosis present

## 2020-09-05 DIAGNOSIS — Z20828 Contact with and (suspected) exposure to other viral communicable diseases: Secondary | ICD-10-CM | POA: Diagnosis not present

## 2020-09-05 DIAGNOSIS — R059 Cough, unspecified: Secondary | ICD-10-CM | POA: Diagnosis not present

## 2020-09-05 DIAGNOSIS — C218 Malignant neoplasm of overlapping sites of rectum, anus and anal canal: Secondary | ICD-10-CM | POA: Insufficient documentation

## 2020-09-05 DIAGNOSIS — R531 Weakness: Secondary | ICD-10-CM | POA: Diagnosis present

## 2020-09-05 DIAGNOSIS — J9600 Acute respiratory failure, unspecified whether with hypoxia or hypercapnia: Secondary | ICD-10-CM

## 2020-09-05 DIAGNOSIS — K7460 Unspecified cirrhosis of liver: Secondary | ICD-10-CM | POA: Diagnosis not present

## 2020-09-05 DIAGNOSIS — I10 Essential (primary) hypertension: Secondary | ICD-10-CM | POA: Diagnosis not present

## 2020-09-05 DIAGNOSIS — R509 Fever, unspecified: Secondary | ICD-10-CM | POA: Diagnosis not present

## 2020-09-05 DIAGNOSIS — R0602 Shortness of breath: Secondary | ICD-10-CM | POA: Diagnosis not present

## 2020-09-05 DIAGNOSIS — D72819 Decreased white blood cell count, unspecified: Secondary | ICD-10-CM

## 2020-09-05 DIAGNOSIS — J189 Pneumonia, unspecified organism: Secondary | ICD-10-CM | POA: Diagnosis not present

## 2020-09-05 LAB — POC SARS CORONAVIRUS 2 AG -  ED: SARS Coronavirus 2 Ag: POSITIVE — AB

## 2020-09-05 LAB — CBC WITH DIFFERENTIAL/PLATELET
Abs Immature Granulocytes: 0.02 10*3/uL (ref 0.00–0.07)
Basophils Absolute: 0 10*3/uL (ref 0.0–0.1)
Basophils Relative: 1 %
Eosinophils Absolute: 0 10*3/uL (ref 0.0–0.5)
Eosinophils Relative: 0 %
HCT: 38.5 % — ABNORMAL LOW (ref 39.0–52.0)
Hemoglobin: 12.5 g/dL — ABNORMAL LOW (ref 13.0–17.0)
Immature Granulocytes: 1 %
Lymphocytes Relative: 27 %
Lymphs Abs: 0.4 10*3/uL — ABNORMAL LOW (ref 0.7–4.0)
MCH: 31.7 pg (ref 26.0–34.0)
MCHC: 32.5 g/dL (ref 30.0–36.0)
MCV: 97.7 fL (ref 80.0–100.0)
Monocytes Absolute: 0.2 10*3/uL (ref 0.1–1.0)
Monocytes Relative: 11 %
Neutro Abs: 0.8 10*3/uL — ABNORMAL LOW (ref 1.7–7.7)
Neutrophils Relative %: 60 %
Platelets: 62 10*3/uL — ABNORMAL LOW (ref 150–400)
RBC: 3.94 MIL/uL — ABNORMAL LOW (ref 4.22–5.81)
RDW: 13.7 % (ref 11.5–15.5)
WBC: 1.4 10*3/uL — CL (ref 4.0–10.5)
nRBC: 0 % (ref 0.0–0.2)

## 2020-09-05 LAB — COMPREHENSIVE METABOLIC PANEL
ALT: 17 U/L (ref 0–44)
AST: 39 U/L (ref 15–41)
Albumin: 2.9 g/dL — ABNORMAL LOW (ref 3.5–5.0)
Alkaline Phosphatase: 66 U/L (ref 38–126)
Anion gap: 13 (ref 5–15)
BUN: 12 mg/dL (ref 8–23)
CO2: 18 mmol/L — ABNORMAL LOW (ref 22–32)
Calcium: 7.9 mg/dL — ABNORMAL LOW (ref 8.9–10.3)
Chloride: 108 mmol/L (ref 98–111)
Creatinine, Ser: 0.89 mg/dL (ref 0.61–1.24)
GFR, Estimated: >60 mL/min (ref >60)
Glucose, Bld: 84 mg/dL (ref 70–99)
Potassium: 3.5 mmol/L (ref 3.5–5.1)
Sodium: 139 mmol/L (ref 135–145)
Total Bilirubin: 1 mg/dL (ref 0.3–1.2)
Total Protein: 7.1 g/dL (ref 6.5–8.1)

## 2020-09-05 LAB — URINALYSIS, ROUTINE W REFLEX MICROSCOPIC
Bilirubin Urine: NEGATIVE
Glucose, UA: NEGATIVE mg/dL
Ketones, ur: 20 mg/dL — AB
Leukocytes,Ua: NEGATIVE
Nitrite: NEGATIVE
Protein, ur: 100 mg/dL — AB
RBC / HPF: >50 RBC/hpf — ABNORMAL HIGH (ref 0–5)
Specific Gravity, Urine: 1.024 (ref 1.005–1.030)
pH: 5 (ref 5.0–8.0)

## 2020-09-05 LAB — SARS CORONAVIRUS 2 (TAT 6-24 HRS): SARS Coronavirus 2: POSITIVE — AB

## 2020-09-05 MED ORDER — IOHEXOL 350 MG/ML SOLN
75.0000 mL | Freq: Once | INTRAVENOUS | Status: AC | PRN
  Administered 2020-09-05: 75 mL via INTRAVENOUS

## 2020-09-05 NOTE — ED Triage Notes (Signed)
Pt arrives via [facility] with c/o of cough and fever x3 days. Pt has not had any covid testing. Denies any shortness of breath of chest pain.

## 2020-09-05 NOTE — ED Provider Notes (Signed)
[facility] EMERGENCY DEPARTMENT Provider Note   CSN: [id number] Arrival date & time: 09/05/20  1537     History Chief Complaint  Patient presents with  . Cough  . Fever    Eric Schultz is a 80 y.o. male.  HPI 80 year old male with a history of hypertension, rectal cancer, liver cirrhosis, chronic venous stasis dermatitis of both lower extremities, obesity presents to the ER with complaints of "feeling poorly".  Patient is a questionable historian.  He states he has been feeling weak and tired over the last few days.  Endorses a cough.  He states that he has been his bowels and urinating on himself which has been going on for "quite some time".  He states "I came to the hospital because I guess I need help with my bodily functions".  He is not vaccinated for COVID and states that "I do not want it".  Denies any chest pain, back pain, dysuria.  I spoke with the patient's listed contact Dorinda who is his cousin.  She reports that the patient is a recluse and does not let anyone into his home.  States that he is not alone and does not have any follow-up.  When I asked the patient about his rectal cancer, he acted as though he was not aware of this.  When I asked if he was getting treatment for it, he states no "I do not think there is anything to do about it" but then asks  what I think I should do about his cancer.     Past Medical History:  Diagnosis Date  . Allergy   . Cellulitis    left lower leg  . Cellulitis and abscess of left leg 05/09/2020  . Gout   . HOH (hard of hearing)   . Hypertension    "borderline"  . Rectal cancer ([place])   . Sleep apnea     Patient Active Problem List   Diagnosis Date Noted  . Cellulitis 05/07/2020  . Pancytopenia ([place]) 05/07/2020  . Obesity, Class III, BMI 40-49.9 (morbid obesity) ([place]) 05/07/2020  . Liver cirrhosis ([place]) 05/07/2020  . Rectal adenocarcinoma ([place]) 02/29/2020  . Cellulitis of foot, left 03/21/2019  .  Chronic venous stasis dermatitis of both lower extremities 03/21/2019  . AKI (acute kidney injury) ([place]) 03/21/2019  . Acute on chronic respiratory failure with hypoxia ([place]) 03/21/2019    No past surgical history on file.     Family History  Problem Relation Age of Onset  . Heart failure Mother   . Heart failure Father   . Stroke Father   . Colon cancer Neg Hx   . Esophageal cancer Neg Hx   . Rectal cancer Neg Hx   . Stomach cancer Neg Hx     Social History   Tobacco Use  . Smoking status: Never Smoker  . Smokeless tobacco: Never Used  Vaping Use  . Vaping Use: Never used  Substance Use Topics  . Alcohol use: No  . Drug use: No    Home Medications Prior to Admission medications   Medication Sig Start Date End Date Taking? Authorizing Provider  clindamycin (CLEOCIN) 150 MG capsule Take 1 capsule (150 mg total) by mouth 2 (two) times daily. Patient not taking: Reported on 06/17/2020 06/03/20   Volney American, PA-C  furosemide (LASIX) 40 MG tablet Take 1 tablet (40 mg total) by mouth daily. 05/09/20 05/09/21  [place] Eric J, DO  HYDROcodone-acetaminophen (NORCO/VICODIN) 5-325 MG tablet Take  1 tablet by mouth every 6 (six) hours as needed. Patient not taking: Reported on 06/17/2020 05/30/20   Vanessa Kick, MD  potassium chloride SA (KLOR-CON) 20 MEQ tablet Take 1 tablet (20 mEq total) by mouth daily. 04/28/20   Malvin Johns, MD    Allergies    Sulfa antibiotics  Review of Systems   Review of Systems  Constitutional: Negative for chills and fever.  HENT: Negative for ear pain and sore throat.   Eyes: Negative for pain and visual disturbance.  Respiratory: Positive for cough and shortness of breath.   Cardiovascular: Negative for chest pain and palpitations.  Gastrointestinal: Negative for abdominal pain and vomiting.  Genitourinary: Negative for dysuria and hematuria.  Musculoskeletal: Negative for arthralgias and back pain.  Skin: Negative for color change  and rash.  Neurological: Negative for seizures and syncope.  All other systems reviewed and are negative.   Physical Exam Updated Vital Signs BP 139/71   Pulse 68   Temp 100 F (37.8 C) (Oral)   Resp 19   SpO2 90%   Physical Exam Vitals and nursing note reviewed.  Constitutional:      General: He is not in acute distress.    Appearance: He is well-developed and well-nourished. He is ill-appearing. He is not toxic-appearing or diaphoretic.  HENT:     Head: Normocephalic and atraumatic.     Mouth/Throat:     Mouth: Mucous membranes are moist.     Pharynx: Oropharynx is clear.  Eyes:     Conjunctiva/sclera: Conjunctivae normal.  Cardiovascular:     Rate and Rhythm: Normal rate and regular rhythm.     Heart sounds: No murmur heard.   Pulmonary:     Effort: Pulmonary effort is normal. No respiratory distress.     Breath sounds: Normal breath sounds.  Abdominal:     General: Abdomen is flat.     Palpations: Abdomen is soft.     Tenderness: There is no abdominal tenderness.  Genitourinary:    Comments: Evidence of dried urine and feces on his scrub pants Musculoskeletal:        General: Normal range of motion.     Cervical back: Neck supple.     Right lower leg: Edema present.     Left lower leg: Edema present.     Comments: No midline tenderness in the T, L-spine.  Moving all 4 extremities without difficulty.  Evidence of severe chronic venous stasis and lymphedema to both legs.  Very dry scaly skin to both lower extremities.  Skin:    General: Skin is warm and dry.     Capillary Refill: Capillary refill takes less than 2 seconds.  Neurological:     General: No focal deficit present.     Mental Status: He is alert.     Sensory: No sensory deficit.     Motor: No weakness.  Psychiatric:        Mood and Affect: Mood and affect normal.     ED Results / Procedures / Treatments   Labs (all labs ordered are listed, but only abnormal results are displayed) Labs  Reviewed  CBC WITH DIFFERENTIAL/PLATELET - Abnormal; Notable for the following components:      Result Value   WBC 1.4 (*)    RBC 3.94 (*)    Hemoglobin 12.5 (*)    HCT 38.5 (*)    Platelets 62 (*)    Neutro Abs 0.8 (*)    Lymphs Abs 0.4 (*)  All other components within normal limits  COMPREHENSIVE METABOLIC PANEL - Abnormal; Notable for the following components:   CO2 18 (*)    Calcium 7.9 (*)    Albumin 2.9 (*)    All other components within normal limits  URINALYSIS, ROUTINE W REFLEX MICROSCOPIC - Abnormal; Notable for the following components:   Color, Urine AMBER (*)    Hgb urine dipstick MODERATE (*)    Ketones, ur 20 (*)    Protein, ur 100 (*)    RBC / HPF >50 (*)    Bacteria, UA RARE (*)    All other components within normal limits  POC SARS CORONAVIRUS 2 AG -  ED - Abnormal; Notable for the following components:   SARS Coronavirus 2 Ag POSITIVE (*)    All other components within normal limits  SARS CORONAVIRUS 2 (TAT 6-24 HRS)  PATHOLOGIST SMEAR REVIEW    EKG EKG Interpretation  Date/Time:  Wednesday September 05 2020 20:48:24 EST Ventricular Rate:  69 PR Interval:    QRS Duration: 164 QT Interval:  462 QTC Calculation: 495 R Axis:   -71 Text Interpretation: Sinus or ectopic atrial rhythm Ventricular premature complex Prolonged PR interval RBBB and LAFB Baseline wander in lead(s) II III aVF Sinus, prolonged PR and RBBB which is unchagned Confirmed by Lavenia Atlas [phone]) on 09/05/2020 9:20:35 PM   Radiology DG Chest Portable 1 View  Result Date: 09/05/2020 CLINICAL DATA:  Cough and fever EXAM: PORTABLE CHEST 1 VIEW COMPARISON:  10/15/2019 FINDINGS: Cardiomegaly. Hazy and patchy airspace opacities in the left greater than right lung bases. Aortic atherosclerosis. No pneumothorax. IMPRESSION: Hazy and patchy airspace opacities in the left greater than right lung bases, suspicious for pneumonia. Electronically Signed   By: Donavan Foil M.D.   On: 09/05/2020  19:57    Procedures Procedures (including critical care time)  Medications Ordered in ED Medications - No data to display  ED Course  I have reviewed the triage vital signs and the nursing notes.  Pertinent labs & imaging results that were available during my care of the patient were reviewed by me and considered in my medical decision making (see chart for details).    MDM Rules/Calculators/A&P                         80 year old male who presents to the ER with complaints of cough, weakness, incontinence of urine and bowel for "several months".  Vitals on arrival with borderline temperature of 100, no evidence of hypoxia at rest or tachycardia.  Blood pressure at 133/67. On arrival, the patient is alert, oriented x3, hard of hearing, in no acute respiratory distress, speaking in full sentences.  Physical exam with clear lung sounds, soft and nontender abdomen, no C, T, L-spine tenderness.  He has severe lymphedema and chronic venous stasis of his lower extremities.  He was able to ambulate with nursing staff here.  He has evidence of dried feces and urine on his pants.  I did fairly extensive review of his work-up and his prior visits.  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  CT scan done in July 2021.  Suspect part of his urine and bowel incontinence is secondary to the rectal cancer, however spinal mets/pathology cannot be excluded.  There is evidence of metastases to the thoracic spine.  I ordered, reviewed and interpreted his lab work, CBC with leukopenia of 1.4, hemoglobin 12.5.  BMP  without any acute electrolyte abnormalities, normal liver function tests and renal function.  UA with moderate hemoglobin, ketones, more than 50 RBCs and rare bacteria.  Not overly suggestive of UTI.    Chest x-ray is suggestive of pneumonia.  COVID test is positive here today.  EKG unchanged from prior.  Patient was ambulated in the ER room by  nursing staff, became hypoxic to 85 to 87% became very winded.  In the setting of COVID-19 and severe metastatic disease with leukopenia and hypoxia, patient will need admission.  He likely will need palliative care as well.  Question possible PE however the patient is not tachycardic or in any respiratory distress at this time.  I discussed this with the patient and the need for admission for further care, he is agreeable to this at this time.  Consulted Dr. Cyd Silence who would like for me to order a PE study.  Order placed.  He will admit the patient for further evaluation and treatment.  This was a shared visit with my supervising physician Dr. Dina Rich who independently saw and evaluated the patient & provided guidance in evaluation/management/disposition ,in agreement with care   Final Clinical Impression(s) / ED Diagnoses Final diagnoses:  Acute respiratory failure due to COVID-19 [facility])  Leukopenia, unspecified type    Rx / DC Orders ED Discharge Orders    None       Garald Balding, PA-C 09/05/20 2224    Lorelle Gibbs, DO 09/06/20 0002

## 2020-09-05 NOTE — ED Notes (Signed)
Pt 96% on RA laying down in stretcher bed. After this RN and Alexa, NT, assisted pt to stand and ambulate room, pt o2sat dropped to 87% RA. Back in bed, o2sat 95%.

## 2020-09-06 ENCOUNTER — Encounter (HOSPITAL_COMMUNITY): Payer: Self-pay | Admitting: Internal Medicine

## 2020-09-06 DIAGNOSIS — U071 COVID-19: Secondary | ICD-10-CM | POA: Diagnosis present

## 2020-09-06 DIAGNOSIS — D61818 Other pancytopenia: Secondary | ICD-10-CM

## 2020-09-06 DIAGNOSIS — L03115 Cellulitis of right lower limb: Secondary | ICD-10-CM

## 2020-09-06 DIAGNOSIS — I10 Essential (primary) hypertension: Secondary | ICD-10-CM | POA: Diagnosis not present

## 2020-09-06 DIAGNOSIS — K7460 Unspecified cirrhosis of liver: Secondary | ICD-10-CM

## 2020-09-06 DIAGNOSIS — L03116 Cellulitis of left lower limb: Secondary | ICD-10-CM | POA: Diagnosis not present

## 2020-09-06 DIAGNOSIS — C20 Malignant neoplasm of rectum: Secondary | ICD-10-CM

## 2020-09-06 LAB — BRAIN NATRIURETIC PEPTIDE: B Natriuretic Peptide: 181 pg/mL — ABNORMAL HIGH (ref 0.0–100.0)

## 2020-09-06 MED ORDER — SODIUM CHLORIDE 0.9 % IV SOLN
2.0000 g | Freq: Three times a day (TID) | INTRAVENOUS | Status: DC
  Administered 2020-09-06: 2 g via INTRAVENOUS
  Filled 2020-09-06: qty 2

## 2020-09-06 MED ORDER — ZINC SULFATE 220 (50 ZN) MG PO CAPS
220.0000 mg | ORAL_CAPSULE | Freq: Every day | ORAL | Status: DC
  Administered 2020-09-06: 220 mg via ORAL
  Filled 2020-09-06: qty 1

## 2020-09-06 MED ORDER — ENOXAPARIN SODIUM 40 MG/0.4ML ~~LOC~~ SOLN
40.0000 mg | SUBCUTANEOUS | Status: DC
  Administered 2020-09-06: 40 mg via SUBCUTANEOUS
  Filled 2020-09-06: qty 0.4

## 2020-09-06 MED ORDER — VANCOMYCIN HCL 2000 MG/400ML IV SOLN
2000.0000 mg | INTRAVENOUS | Status: DC
  Filled 2020-09-06: qty 400

## 2020-09-06 MED ORDER — LACTATED RINGERS IV BOLUS
500.0000 mL | Freq: Once | INTRAVENOUS | Status: AC
  Administered 2020-09-06: 500 mL via INTRAVENOUS

## 2020-09-06 MED ORDER — SODIUM CHLORIDE 0.9 % IV SOLN
100.0000 mg | Freq: Every day | INTRAVENOUS | Status: DC
  Filled 2020-09-06: qty 20

## 2020-09-06 MED ORDER — METHYLPREDNISOLONE SODIUM SUCC 125 MG IJ SOLR
60.0000 mg | Freq: Two times a day (BID) | INTRAMUSCULAR | Status: DC
  Administered 2020-09-06: 60 mg via INTRAVENOUS
  Filled 2020-09-06: qty 2

## 2020-09-06 MED ORDER — SODIUM CHLORIDE 0.9 % IV SOLN
200.0000 mg | Freq: Once | INTRAVENOUS | Status: DC
  Filled 2020-09-06: qty 40

## 2020-09-06 MED ORDER — SODIUM CHLORIDE 0.9 % IV SOLN
200.0000 mg | Freq: Once | INTRAVENOUS | Status: AC
  Administered 2020-09-06: 200 mg via INTRAVENOUS
  Filled 2020-09-06: qty 40

## 2020-09-06 MED ORDER — PREDNISONE 20 MG PO TABS: 50.0000 mg | ORAL_TABLET | Freq: Every day | ORAL | Status: DC

## 2020-09-06 MED ORDER — ONDANSETRON HCL 4 MG PO TABS: 4.0000 mg | ORAL_TABLET | Freq: Four times a day (QID) | ORAL | Status: DC | PRN

## 2020-09-06 MED ORDER — SODIUM CHLORIDE 0.9 % IV SOLN: 100.0000 mg | Freq: Every day | INTRAVENOUS | Status: DC

## 2020-09-06 MED ORDER — ASCORBIC ACID 500 MG PO TABS
500.0000 mg | ORAL_TABLET | Freq: Every day | ORAL | Status: DC
  Administered 2020-09-06: 500 mg via ORAL
  Filled 2020-09-06: qty 1

## 2020-09-06 MED ORDER — VANCOMYCIN HCL 2000 MG/400ML IV SOLN
2000.0000 mg | Freq: Once | INTRAVENOUS | Status: AC
  Administered 2020-09-06: 2000 mg via INTRAVENOUS
  Filled 2020-09-06: qty 400

## 2020-09-06 MED ORDER — ONDANSETRON HCL 4 MG/2ML IJ SOLN: 4.0000 mg | Freq: Four times a day (QID) | INTRAMUSCULAR | Status: DC | PRN

## 2020-09-06 MED ORDER — SODIUM CHLORIDE 0.9 % IV SOLN
2.0000 g | Freq: Every day | INTRAVENOUS | Status: DC
  Filled 2020-09-06: qty 20

## 2020-09-06 MED ORDER — GUAIFENESIN-DM 100-10 MG/5ML PO SYRP: 10.0000 mL | ORAL_SOLUTION | ORAL | Status: DC | PRN

## 2020-09-06 MED ORDER — SODIUM CHLORIDE 0.9 % IV SOLN
2.0000 g | Freq: Once | INTRAVENOUS | Status: AC
  Administered 2020-09-06: 2 g via INTRAVENOUS
  Filled 2020-09-06: qty 2

## 2020-09-06 MED ORDER — PREDNISONE 10 MG PO TABS: ORAL_TABLET | ORAL | 0 refills | Status: AC

## 2020-09-06 MED ORDER — LACTATED RINGERS IV SOLN: INTRAVENOUS | Status: AC

## 2020-09-06 MED ORDER — ACETAMINOPHEN 325 MG PO TABS
650.0000 mg | ORAL_TABLET | Freq: Four times a day (QID) | ORAL | Status: DC | PRN
  Administered 2020-09-06: 650 mg via ORAL
  Filled 2020-09-06: qty 2

## 2020-09-06 MED ORDER — POLYETHYLENE GLYCOL 3350 17 G PO PACK: 17.0000 g | PACK | Freq: Every day | ORAL | Status: DC | PRN

## 2020-09-06 MED ORDER — ALBUTEROL SULFATE HFA 108 (90 BASE) MCG/ACT IN AERS: 2.0000 | INHALATION_SPRAY | RESPIRATORY_TRACT | Status: DC | PRN

## 2020-09-06 NOTE — ED Notes (Signed)
Breakfast Ordered 

## 2020-09-06 NOTE — Evaluation (Signed)
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 Pt tested positive for COVID.  Clinical Impression  Pt participated in evaluation but easily frustrated being in the ED. Pt states he was I prior to admission without use of AD, but would furniture walk. Pt requiring assist to maintain balance and ambulate without AD. Pt requiring close S and verbal cueing for short ambulation with RW. Ambulation limited due to confined to ED room. Pt on RA during session and O2 remained 88 or greater throughout session. Pt will benefit from skilled PT to address deficits in balance, strength, coordination, gait, endurance and safety use of DME to maximize independence with functional mobility prior to discharge. Recommend follow up PT to address balance, gait and DME.     Follow Up Recommendations Home health PT    Equipment Recommendations  Rolling walker with 5" wheels;3in1 (PT)    Recommendations for Other Services       Precautions / Restrictions Precautions: Covid + Precautions: Fall       Mobility  Bed Mobility Overal bed mobility: Modified Independent             General bed mobility comments: increased time needed; pt states he sleeps in a recliner and won't need to perform in<>out of bed    Transfers Overall transfer level: Needs assistance Equipment used: Rolling walker (2 wheeled) Transfers: Sit to/from Stand Sit to Stand: Min assist            Ambulation/Gait Ambulation/Gait assistance: Min  assist;Supervision Gait Distance (Feet): 15 Feet Assistive device: Rolling walker (2 wheeled)       General Gait Details: limited due to eval in ED and pt confined to room due to covid. Pt ambulated without RW with reaching for walls and bed requiring min A to ambulate, pt performed ambulation wtih RW with S and verbal cueing for safe RW negotiation  Stairs            Wheelchair Mobility    Modified Rankin (Stroke Patients Only)       Balance Overall balance assessment: Needs assistance Sitting-balance support: No upper extremity supported;Feet unsupported Sitting balance-Leahy Scale: Good     Standing balance support: No upper extremity supported Standing balance-Leahy Scale: Poor Standing balance comment: requring min A without RW                             Pertinent Vitals/Pain      Home Living Family/patient expects to be discharged to:: Private residence Living Arrangements: Alone Available Help at Discharge: Friend(s);Available PRN/intermittently Type of Home: House Home Access: Ramped entrance     Home Layout: Two level;Able to live on main level with bedroom/bathroom   Additional Comments: pt unsure of equipment, pt doesnt use any    Prior Function Level of Independence: Independent               Hand Dominance   Dominant Hand: Right    Extremity/Trunk Assessment        Lower Extremity Assessment Lower Extremity Assessment: Generalized weakness  Communication   Communication: HOH  Cognition Arousal/Alertness: Awake/alert Behavior During Therapy: WFL for tasks assessed/performed Overall Cognitive Status: Within Functional Limits for tasks assessed                                        General Comments General comments (skin integrity, edema, etc.): pt taken off O2 and remain 88% of higher on RA throughotu evaluation    Exercises     Assessment/Plan    PT Assessment Patient needs continued  PT services  PT Problem List Decreased strength;Decreased mobility;Decreased coordination;Decreased activity tolerance;Decreased balance;Decreased knowledge of use of DME       PT Treatment Interventions DME instruction;Therapeutic exercise;Gait training;Balance training;Stair training;Therapeutic activities;Patient/family education    PT Goals (Current goals can be found in the Care Plan section)  Acute Rehab PT Goals Patient Stated Goal: When can I get out of here PT Goal Formulation: With patient Time For Goal Achievement: 09/20/20 Potential to Achieve Goals: Good    Frequency Min 3X/week   Barriers to discharge        Co-evaluation               AM-PAC PT "6 Clicks" Mobility  Outcome Measure Help needed turning from your back to your side while in a flat bed without using bedrails?: None Help needed moving from lying on your back to sitting on the side of a flat bed without using bedrails?: None Help needed moving to and from a bed to a chair (including a wheelchair)?: A Little Help needed standing up from a chair using your arms (e.g., wheelchair or bedside chair)?: A Little Help needed to walk in hospital room?: A Little Help needed climbing 3-5 steps with a railing? : A Lot 6 Click Score: 19    End of Session Equipment Utilized During Treatment: Gait belt;Oxygen Activity Tolerance: Patient tolerated treatment well Patient left: in bed;with call bell/phone within reach Nurse Communication: Mobility status PT Visit Diagnosis: Unsteadiness on feet (R26.81);Muscle weakness (generalized) (M62.81);Other abnormalities of gait and mobility (R26.89)    Time: 1059-1130 PT Time Calculation (min) (ACUTE ONLY): 31 min   Charges:   PT Evaluation $PT Eval Low Complexity: 1 Low PT Treatments $Gait Training: 8-22 mins        Lyanne Co, DPT Acute Rehabilitation Services [phone]  [name] 09/06/2020, 12:03 PM

## 2020-09-06 NOTE — ED Notes (Signed)
Pt leaving. Has removed all monitor leads and O2. Case worker working on obtaining walker for discharge.

## 2020-09-06 NOTE — ED Notes (Signed)
This RN called lab to add on BNP. 

## 2020-09-06 NOTE — ED Notes (Signed)
Patient up to bathroom without notifying staff. Refuses help. Requesting "privacy". Will monitor from outside the door.

## 2020-09-06 NOTE — Consult Note (Signed)
[facility] Nurse wound consult note Consultation was completed by review of records, images and assistance from the bedside nurse/clinical staff.   Reason for Consult: lymphedema bilateral LEs Wound type: no open wounds; scabbed areas per bedside nurse  Pressure Injury POA: NA  Dressing procedure/placement/frequency: Cover any open areas or areas of weeping with xeroform gauze. Secure with kerlix and 4" ACE wraps from toes to knees. Change daily.   Secure chat discussion with beside nurse related to updated orders and MD as well.  Lymphedema treatment  is  outside the scope of practice for the [facility]. I have provided resources below for outpatient lymphedema treatment  Lymphedema  Resources (updated July 2020 ) Each site requires a referral from your primary care MD [place], [place]  [phone] (Upper extremities)  [place], [place] [phone] (Lower extremities)  [facility] [address] [place], [place] [address] [phone] [facility] [place], [place] [address] [phone] [place] Medical Office Building [place], [place] [phone]  [facility] [place], [place] [address] [phone]  [facility]  (only treatment for lymphedema related to cancer diagnosis) [place], [place] [address] [phone]    [facility] [address] [place], [place] [address] [phone]  [facility] [address] [place], [place] [address] [phone] [facility] [place] [address] [place], [place]  [address] [phone]   Discussed POC bedside nurse.  Re consult if needed, will not follow at this time. Thanks  Cabe Lashley [employer], RN,CWOCN, CNS, [place] [phone])

## 2020-09-06 NOTE — ED Notes (Signed)
PT IV fell out. Vancomycin was done infusing. This RN switched cont LR to other IV site.

## 2020-09-06 NOTE — ED Notes (Signed)
This RN was able to obtain one set of blood cultures. Pt refused the second set stating that "he is done with all of that". Dr. Cyd Silence notified.

## 2020-09-06 NOTE — ED Notes (Signed)
Appears to be sleeping. Chest with equal rise and fall.

## 2020-09-06 NOTE — ED Notes (Signed)
Lunch Tray Ordered @ 1022. 

## 2020-09-06 NOTE — ED Notes (Signed)
Pt continues to refuse to wear oxygen, removes after this nurse applies

## 2020-09-06 NOTE — H&P (Addendum)
History and Physical    Eric Schultz [id number] DOB: [birth year] DOA: 09/05/2020  [facility]  Patient coming from: home   Chief Complaint:  Chief Complaint  Patient presents with  . Cough  . 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  Patient explains that approximately 10 days ago he began to experience generalized malaise and fatigue.  The symptoms continue to persist in the days that followed and became associated with extremely poor oral intake.  Patient states that in the past 3 to 4 days prior to his presentation he also noticed that he was having episodes of fever and a progressively worsening dry nonproductive cough.  Patient also states that he has been experiencing intermittent watery stools over the span of time as well.  Patient complains of mild associated shortness of breath.   patient denies chest pain, sick contacts, recent travel or confirmed contact with COVID-19 infection.  Of note, patient is unvaccinated for COVID-19.  Patient symptoms continued to worsen and became associated with generalized weakness and unsteady gait with the patient reporting multiple episodes of nearly falling due to his severe weakness.  Symptoms continue to persist until he eventually contacted EMS who promptly brought the patient into [facility] emergency room for evaluation.  Upon evaluation in the emergency department patient was found to be positive for COVID-19.  Clinically, patient was found to be somewhat dehydrated with episodic hypoxia at rest.  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  The hospitalist group was then called to assess the patient for admission to the hospital.  Review of Systems:   Review of Systems  Constitutional: Positive for fever and malaise/fatigue.  Respiratory: Positive for cough and shortness of breath.   Musculoskeletal: Positive for falls and myalgias.  Neurological: Positive for dizziness and weakness.  All other systems reviewed and are negative.   Past Medical History:  Diagnosis Date  . Allergy   . Cellulitis    left lower leg  . Cellulitis and abscess of left leg 05/09/2020  . Gout   . HOH (hard of hearing)   . Hypertension    "borderline"  . Rectal cancer ([place])   . Sleep apnea     History reviewed. No pertinent surgical history.   reports that he has never smoked. He has never used smokeless tobacco. He reports that he does not drink alcohol and does not use drugs.  Allergies  Allergen Reactions  . Sulfa Antibiotics Nausea And Vomiting    Family History  Problem Relation Age of Onset  . Heart failure Mother   . Heart failure Father   . Stroke Father   . Colon cancer Neg Hx   . Esophageal cancer Neg Hx   . Rectal cancer Neg Hx   . Stomach cancer Neg Hx      Prior to Admission medications   Medication Sig Start Date End Date Taking? Authorizing Provider  clindamycin (CLEOCIN) 150 MG capsule Take 1 capsule (150 mg total) by mouth 2 (two) times daily. Patient not  taking: Reported on 06/17/2020 06/03/20   Volney American, PA-C  furosemide (LASIX) 40 MG tablet Take 1 tablet (40 mg total) by mouth daily. 05/09/20 05/09/21  [place] Eric J, DO  HYDROcodone-acetaminophen (NORCO/VICODIN) 5-325 MG tablet Take 1 tablet by mouth every 6 (six) hours as needed. Patient not taking: Reported on 06/17/2020 05/30/20   Vanessa Kick, MD  potassium chloride SA (KLOR-CON) 20 MEQ tablet Take 1  tablet (20 mEq total) by mouth daily. 04/28/20   Malvin Johns, MD    Physical Exam: Vitals:   09/05/20 1921 09/05/20 2015 09/05/20 2030 09/05/20 2215  BP:  133/67 (!) 174/81 139/71  Pulse:  62 78 68  Resp:  (!) 23 (!) 29 19  Temp: 100 F (37.8 C)     TempSrc: Oral     SpO2:  94% 100% 90%    Constitutional: Patient is lethargic but arousable and oriented x3.  She is currently not in any associated distress.  Skin: Extremely poor skin turgor noted.  Severe hyperkeratosis of the anterior surfaces of the bilateral lower extremities thought to be secondary to longstanding lymphedema.  Furthermore, patient does exhibit significant hyperemia of the bilateral lower extremities from the feet all the way up to the knees with some associated induration and tenderness.   Eyes: Pupils are equally reactive to light.  No evidence of scleral icterus or conjunctival pallor.  ENMT: Extremely dry mucous membranes noted.  Posterior pharynx clear of any exudate or lesions.   Neck: normal, supple, no masses, no thyromegaly.  No evidence of jugular venous distension.   Respiratory: clear to auscultation bilaterally, no wheezing, no crackles. Normal respiratory effort. No accessory muscle use.  Cardiovascular: Regular rate and rhythm, no murmurs / rubs / gallops.  Significant bilateral lower extremity edema, left greater than right which is chronic.  2+ pedal pulses. No carotid bruits.  Chest:   Nontender without crepitus or deformity.   Back:   Nontender without crepitus or deformity. Abdomen: Abdomen is protuberant but soft and nontender.  No evidence of intra-abdominal masses.  Positive bowel sounds noted in all quadrants.   Musculoskeletal: Significant tenderness and warmth of the bilateral lower extremities particularly in the areas of redness and induration surrounding the areas of hyperkeratosis as mentioned in the skin examination.  Markedly thickened and brittle nails of the bilateral feet.   Good ROM, no  contractures. Normal muscle tone.  Neurologic: Patient is lethargic but arousable and oriented x3.  CN 2-12 grossly intact. Sensation intact.  Patient moving all 4 extremities spontaneously.  Patient is following all commands.  Patient is responsive to verbal stimuli.   Psychiatric: Patient exhibits a depressed mood with odd affect.  Patient seems to possess insight as to their current situation.     Labs on Admission: I have personally reviewed following labs and imaging studies -   CBC: Recent Labs  Lab 09/05/20 2032  WBC 1.4*  NEUTROABS 0.8*  HGB 12.5*  HCT 38.5*  MCV 97.7  PLT 62*   Basic Metabolic Panel: Recent Labs  Lab 09/05/20 2032  NA 139  K 3.5  CL 108  CO2 18*  GLUCOSE 84  BUN 12  CREATININE 0.89  CALCIUM 7.9*   GFR: CrCl cannot be calculated (Unknown ideal weight.). Liver Function Tests: Recent Labs  Lab 09/05/20 2032  AST 39  ALT 17  ALKPHOS 66  BILITOT 1.0  PROT 7.1  ALBUMIN 2.9*   No results for input(s): LIPASE, AMYLASE in the last 168 hours. No  results for input(s): AMMONIA in the last 168 hours. Coagulation Profile: No results for input(s): INR, PROTIME in the last 168 hours. Cardiac Enzymes: No results for input(s): CKTOTAL, CKMB, CKMBINDEX, TROPONINI in the last 168 hours. BNP (last 3 results) No results for input(s): PROBNP in the last 8760 hours. HbA1C: No results for input(s): HGBA1C in the last 72 hours. CBG: No results for input(s): GLUCAP in the last 168 hours. Lipid Profile: No results for input(s): CHOL, HDL, LDLCALC, TRIG, CHOLHDL, LDLDIRECT in the last 72 hours. Thyroid Function Tests: No results for input(s): TSH, T4TOTAL, FREET4, T3FREE, THYROIDAB in the last 72 hours. Anemia Panel: No results for input(s): VITAMINB12, FOLATE, FERRITIN, TIBC, IRON, RETICCTPCT in the last 72 hours. Urine analysis:    Component Value Date/Time   COLORURINE AMBER (A) 09/05/2020 2048   APPEARANCEUR CLEAR 09/05/2020 2048   LABSPEC 1.024  09/05/2020 2048   PHURINE 5.0 09/05/2020 2048   GLUCOSEU NEGATIVE 09/05/2020 2048   HGBUR MODERATE (A) 09/05/2020 2048   BILIRUBINUR NEGATIVE 09/05/2020 2048   KETONESUR 20 (A) 09/05/2020 2048   PROTEINUR 100 (A) 09/05/2020 2048   UROBILINOGEN 0.2 02/09/2014 1954   NITRITE NEGATIVE 09/05/2020 2048   LEUKOCYTESUR NEGATIVE 09/05/2020 2048    Radiological Exams on Admission - Personally Reviewed: CT Angio Chest PE W and/or Wo Contrast  Addendum Date: 09/06/2020   ADDENDUM REPORT: 09/06/2020 00:03 ADDENDUM: These results were called by telephone at the time of interpretation on 09/06/2020 at 12:03 am to provider Dr. Marlyce Huge, who verbally acknowledged these results. Electronically Signed   By: Lovena Le M.D.   On: 09/06/2020 00:03   Result Date: 09/06/2020 CLINICAL DATA:  Cough and fever for 3 days, concern for pulmonary embolism. History of rectal cancer, sleep apnea and hypertension EXAM: CT ANGIOGRAPHY CHEST WITH CONTRAST TECHNIQUE: Multidetector CT imaging of the chest was performed using the standard protocol during bolus administration of intravenous contrast. Multiplanar CT image reconstructions and MIPs were obtained to evaluate the vascular anatomy. CONTRAST:  62mL OMNIPAQUE IOHEXOL 350 MG/ML SOLN COMPARISON:  Radiograph 09/05/2020, CT 02/28/2020 FINDINGS: Cardiovascular: Satisfactory opacification of pulmonary arteries though evaluation beyond the lobar level is limited by respiratory motion artifact. No central or lobar filling defects are identified. Central pulmonary arteries are normal caliber. Mild cardiomegaly with predominantly biatrial enlargement. Coronary artery calcifications are present. Few calcifications are present on the aortic leaflets as well. Atherosclerotic plaque within the normal caliber aorta. Normal 3 vessel branching of the aortic arch. Proximal great vessels are unremarkable. Major venous abnormalities. Mediastinum/Nodes: No mediastinal fluid or gas. Normal thyroid  gland and thoracic inlet. No acute abnormality of the trachea or esophagus. Numerous scattered partially calcified mediastinal and hilar nodes are present. Stable appearance of index nodes including a 18 mm right paratracheal node (5/24), a 1.1 cm left supraclavicular node (5/6), and a 1.3 subcarinal node (5/46). No worrisome enlarged or enlarging nodes seen in the chest or axilla. Lungs/Pleura: Low volumes and atelectatic changes. Some additional mixed areas patchy ground-glass, consolidation and bronchitic changes throughout the lungs. No pneumothorax. No effusion. Minimal chronic scarring towards the bases is similar to priors. No pneumothorax. No effusion. Enlarging appearance of a nodule in the left lower lobe now measuring up to 10 mm in size. And anterior right middle lobe nodule is seen now measuring up to 4 mm, previously 2 mm (7/56). A previously identified nodule in the more posterior right middle lobe is not as well visualized possibly due to motion and atelectatic change. Few clustered solid  nodules are present in the anterior segment right upper lobe as well (7/48). Upper Abdomen: Nodular hepatic surface contour and left lobe hypertrophy compatible with cirrhosis. No acute abnormalities present in the visualized portions of the upper abdomen. Musculoskeletal: Multilevel degenerative changes are present in the imaged portions of the spine. No acute osseous abnormality or suspicious osseous lesion. Review of the MIP images confirms the above findings. IMPRESSION: 1. Satisfactory opacification of pulmonary arteries though evaluation beyond the lobar level is limited by respiratory motion artifact. No central or lobar filling defects are identified. 2. Low volumes and atelectatic changes throughout the lungs. Some additional mixed areas patchy ground-glass, consolidation and bronchitic changes throughout the lungs, could reflect a multifocal infectious/inflammatory process including potential atypical  viral etiologies. 3. Multiple new and enlarging pulmonary nodules in the lungs while some of this may be related to the acute infection or inflammation, increase in size of previous identified nodules is concerning for progressive metastatic disease particularly given patient's known rectal malignancy. 4. Stable partially calcified mediastinal and hilar nodes, nonspecific, possibly related to prior granulomatous disease or treated lymphoma. 5. Cirrhosis. 6. Aortic Atherosclerosis (ICD10-I70.0). Currently attempting to contact the ordering provider with a critical value result. Addendum will be submitted upon case discussion. Electronically Signed: By: Lovena Le M.D. On: 09/05/2020 23:51   DG Chest Portable 1 View  Result Date: 09/05/2020 CLINICAL DATA:  Cough and fever EXAM: PORTABLE CHEST 1 VIEW COMPARISON:  10/15/2019 FINDINGS: Cardiomegaly. Hazy and patchy airspace opacities in the left greater than right lung bases. Aortic atherosclerosis. No pneumothorax. IMPRESSION: Hazy and patchy airspace opacities in the left greater than right lung bases, suspicious for pneumonia. Electronically Signed   By: Donavan Foil M.D.   On: 09/05/2020 19:57    EKG: Personally reviewed.  Rhythm is normal sinus rhythm with heart rate of 69 bpm.  Evidence of left anterior fascicular block.  Multiple PVCs.  No dynamic ST segment changes appreciated.  Assessment/Plan Principal Problem:   COVID-19 virus infection   Patient presenting with approximately 10 days of progressively worsening generalized weakness, malaise, poor appetite with eventual development of cough shortness of breath and fever  Upon evaluation here in the emergency department patient is found to clinically be volume depleted with oxygen saturations hovering between 89 and 92% for the most part  COVID-19 testing is positive here in the emergency department  Considering patient's ongoing untreated metastatic adenocarcinoma, cirrhosis and  pancytopenia with severe neutropenia patient is at high risk of continued clinical decline  Airborne and contact isolation  intravenous steroids as well as remdesivir  As needed antitussives initiated  As needed bronchodilators initiated via MDI  Zinc and vitamin C supplementation initiated  Hydrating patient with intravenous isotonic fluids due to associated hypovolemia.  CT chest revealing areas of patchy groundglass infiltrates throughout the lungs consistent with COVID-19 pneumonia  Procalcitonin ordered and pending  Active Problems:   Bilateral lower leg cellulitis   On examination, patient exhibits significant hyperemia with some induration of the bilateral lower extremities, likely stemming from numerous areas of skin breakdown from severe hyperkeratosis of the skin due to poorly managed lymphedema  Typically, findings of hyperemia of the bilateral lower extremities is not infectious however upon comparing the appearance of patient's legs on this presentation compared to what they look like during his September presentation, there is a significant increase in both the areas of redness and intensity of redness concerning for possible early developing infection  Furthermore, patient is immunocompromised due to ongoing  malignancy and cirrhosis with severe neutropenia  I have therefore initiated intravenous antibiotics for now  Blood cultures have been ordered  Intravenous fluids for associated hypovolemia as noted above  Wound care consultation placed for management of patient substantial lymphedema and areas of skin breakdown    Rectal adenocarcinoma ([place])   Diagnosed in June 2021  Thought to be metastatic to lungs and thoracic spine  Patient originally did follow up with Dr. Burr Medico with Onoclogy however patient has since refused to initiate treatment and was eventually discharged from the clinic in July due to patient refusing to schedule follow-up  appointment  Patient currently does not have an oncologist but is open to being referred to a new one.  CT of the chest performed during this hospitalization revealing enlarging appearance of nodules in the lungs concerning for progressive metastatic disease    Pancytopenia ([place])   Significant pancytopenia noted  While patient has a known history of pancytopenia felt to be secondary to cirrhosis, neutropenia in particular during this presentation is particularly severe  Exacerbation of these numbers may be secondary to underlying infection.  No clinical evidence of bleeding  Monitoring for improvement in neutropenia with treatment of underlying COVID-19 and cellulitis    Liver cirrhosis ([place])   Per review of older notes, cirrhosis is felt to have developed secondary to nonalcoholic fatty liver disease  Patient is suffering from substantial pancytopenia as a result, please see assessment and plan above    Essential hypertension    Due to volume depletion, conservative management at this time  Intravenous antihypertensives for markedly elevated blood pressures.  Code Status:  Full code Family Communication: Deferred  Status is: Observation  The patient remains OBS appropriate and will d/c before 2 midnights.  Dispo: The patient is from: Home              Anticipated d/c is to: Home              Anticipated d/c date is: 2 days              Patient currently is not medically stable to d/c.        Vernelle Emerald MD [facility] Pager [phone]  If 7PM-7AM, please contact night-coverage [email or web address] Use universal [place] password for that web site. If you do not have the password, please call the hospital operator.  09/06/2020, 1:18 AM

## 2020-09-06 NOTE — ED Notes (Signed)
Patient is dressed and waiting for his ride to pick him up at 6pm

## 2020-09-06 NOTE — Discharge Summary (Signed)
Physician Discharge Summary  MAKSYM PFIFFNER WUJ:[id number] DOB: 05/04/1941 DOA: 09/05/2020  [facility]  Admit date: 09/05/2020 Discharge date: 09/06/2020  Admitted From: Home Disposition: Home  Recommendations for Outpatient Follow-up:  1. Follow up with PCP in 1-2 weeks 2. 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  Patient admitted as above due to general malaise, fatigue weakness poor p.o. intake.  Patient noted to be COVID-19 positive with questionable transient hypoxia at intake that resolved overnight.  At this time patient otherwise denies nausea vomiting diarrhea constipation chest pain headache fevers or chills.  He indicates he just felt "unwell" and fatigued with no clear symptoms.  He denies pain or erythema in his lower extremities but does note chronic venous stasis with skin changes.  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  At this time patient does not meet criteria for inpatient treatment given no hypoxia and resolving symptoms with supportive care.  Patient ambulating today without hypoxia or dyspnea, discharged on steroid taper with close outpatient follow-up in the next 1 to 2 weeks.  We discussed at length need for ongoing  quarantine and if patient continues to decline or have worsening symptoms of fatigue weakness nausea vomiting unable to take p.o. safely or worsening dyspnea at rest or with exertion he should report back to the ED.  Discharge Diagnoses:  Principal Problem:   COVID-19 virus infection Active Problems:   Bilateral lower leg cellulitis   Rectal adenocarcinoma ([place])   Pancytopenia (HCC)   Liver cirrhosis ([place])   Essential hypertension    Discharge Instructions  Discharge Instructions    Call MD for:  difficulty breathing, headache or visual disturbances   Complete by: As directed    Call MD for:  temperature >100.4   Complete by: As directed    Diet - low sodium heart healthy   Complete by: As directed    Discharge instructions   Complete by: As directed    ?   Person Under Monitoring Name: ARTIS BEGGS  Location: [place] [address]   Infection Prevention Recommendations for Individuals Confirmed to have, or Being Evaluated for, 2019 Novel Coronavirus (COVID-19) Infection Who Receive Care at Home  Individuals who are confirmed to have, or are being evaluated for, COVID-19 should follow the prevention steps below until a healthcare provider or local or state health department says they can return to normal activities.  Stay home except to get medical care You should restrict activities outside your home, except for getting medical care. Do not go to work, school, or public areas, and do not use public transportation or taxis.  Call ahead before visiting your doctor Before your medical appointment, call the healthcare provider and tell them that you have, or are being evaluated for, COVID-19 infection. This will help the healthcare provider's office take steps to keep other people from getting infected. Ask your healthcare provider  to call the local or state health department.  Monitor your symptoms Seek prompt medical attention if your illness is  worsening (e.g., difficulty breathing). Before going to your medical appointment, call the healthcare provider and tell them that you have, or are being evaluated for, COVID-19 infection. Ask your healthcare provider to call the local or state health department.  Wear a facemask You should wear a facemask that covers your nose and mouth when you are in the same room with other people and when you visit a healthcare provider. People who live with or visit you should also wear a facemask while they are in the same room with you.  Separate yourself from other people in your home As much as possible, you should stay in a different room from other people in your home. Also, you should use a separate bathroom, if available.  Avoid sharing household items You should not share dishes, drinking glasses, cups, eating utensils, towels, bedding, or other items with other people in your home. After using these items, you should wash them thoroughly with soap and water.  Cover your coughs and sneezes Cover your mouth and nose with a tissue when you cough or sneeze, or you can cough or sneeze into your sleeve. Throw used tissues in a lined trash can, and immediately wash your hands with soap and water for at least 20 seconds or use an alcohol-based hand rub.  Wash your [facility] your hands often and thoroughly with soap and water for at least 20 seconds. You can use an alcohol-based hand sanitizer if soap and water are not available and if your hands are not visibly dirty. Avoid touching your eyes, nose, and mouth with unwashed hands.   Prevention Steps for Caregivers and Household Members of Individuals Confirmed to have, or Being Evaluated for, COVID-19 Infection Being Cared for in the Home  If you live with, or provide care at home for, a person confirmed to have, or being evaluated for, COVID-19 infection please follow these guidelines to prevent infection:  Follow healthcare provider's  instructions Make sure that you understand and can help the patient follow any healthcare provider instructions for all care.  Provide for the patient's basic needs You should help the patient with basic needs in the home and provide support for getting groceries, prescriptions, and other personal needs.  Monitor the patient's symptoms If they are getting sicker, call his or her medical provider and tell them that the patient has, or is being evaluated for, COVID-19 infection. This will help the healthcare provider's office take steps to keep other people from getting infected. Ask the healthcare provider to call the local or state health department.  Limit the number of people who have contact with the patient If possible, have only one caregiver for the patient. Other household members should stay in another home or place of residence. If this is not possible, they should stay in another room, or be separated from the patient as much as possible. Use a separate bathroom, if available. Restrict visitors who do not have an essential need to be in the home.  Keep older adults, very young children, and other sick people away from the patient Keep older adults, very young children, and those who have compromised immune systems or chronic health conditions away from the patient. This includes people with chronic heart, lung, or kidney conditions, diabetes, and cancer.  Ensure good ventilation Make sure that shared spaces in the home have good air flow, such  as from an air conditioner or an opened window, weather permitting.  Wash your hands often Wash your hands often and thoroughly with soap and water for at least 20 seconds. You can use an alcohol based hand sanitizer if soap and water are not available and if your hands are not visibly dirty. Avoid touching your eyes, nose, and mouth with unwashed hands. Use disposable paper towels to dry your hands. If not available, use dedicated cloth  towels and replace them when they become wet.  Wear a facemask and gloves Wear a disposable facemask at all times in the room and gloves when you touch or have contact with the patient's blood, body fluids, and/or secretions or excretions, such as sweat, saliva, sputum, nasal mucus, vomit, urine, or feces.  Ensure the mask fits over your nose and mouth tightly, and do not touch it during use. Throw out disposable facemasks and gloves after using them. Do not reuse. Wash your hands immediately after removing your facemask and gloves. If your personal clothing becomes contaminated, carefully remove clothing and launder. Wash your hands after handling contaminated clothing. Place all used disposable facemasks, gloves, and other waste in a lined container before disposing them with other household waste. Remove gloves and wash your hands immediately after handling these items.  Do not share dishes, glasses, or other household items with the patient Avoid sharing household items. You should not share dishes, drinking glasses, cups, eating utensils, towels, bedding, or other items with a patient who is confirmed to have, or being evaluated for, COVID-19 infection. After the person uses these items, you should wash them thoroughly with soap and water.  Wash laundry thoroughly Immediately remove and wash clothes or bedding that have blood, body fluids, and/or secretions or excretions, such as sweat, saliva, sputum, nasal mucus, vomit, urine, or feces, on them. Wear gloves when handling laundry from the patient. Read and follow directions on labels of laundry or clothing items and detergent. In general, wash and dry with the warmest temperatures recommended on the label.  Clean all areas the individual has used often Clean all touchable surfaces, such as counters, tabletops, doorknobs, bathroom fixtures, toilets, phones, keyboards, tablets, and bedside tables, every day. Also, clean any surfaces that may  have blood, body fluids, and/or secretions or excretions on them. Wear gloves when cleaning surfaces the patient has come in contact with. Use a diluted bleach solution (e.g., dilute bleach with 1 part bleach and 10 parts water) or a household disinfectant with a label that says EPA-registered for coronaviruses. To make a bleach solution at home, add 1 tablespoon of bleach to 1 quart (4 cups) of water. For a larger supply, add  cup of bleach to 1 gallon (16 cups) of water. Read labels of cleaning products and follow recommendations provided on product labels. Labels contain instructions for safe and effective use of the cleaning product including precautions you should take when applying the product, such as wearing gloves or eye protection and making sure you have good ventilation during use of the product. Remove gloves and wash hands immediately after cleaning.  Monitor yourself for signs and symptoms of illness Caregivers and household members are considered close contacts, should monitor their health, and will be asked to limit movement outside of the home to the extent possible. Follow the monitoring steps for close contacts listed on the symptom monitoring form.   ? If you have additional questions, contact your local health department or call the epidemiologist on call at [phone] (  available 24/7). ? This guidance is subject to change. For the most up-to-date guidance from CDC, please refer to their website: YouBlogs.pl   Discharge wound care:   Complete by: As directed    Cover any areas of the LEs that are weeping or open with single layer of xeroform gauze. Top with dry dressing and secure with kerlix.  Wrap legs from toes to knees with 4" ACE wraps with slight tension. Change daily. Bedside nurses to perform   Increase activity slowly   Complete by: As directed      Allergies as of 09/06/2020      Reactions    Sulfa Antibiotics Nausea And Vomiting      Medication List    TAKE these medications   furosemide 40 MG tablet Commonly known as: Lasix Take 1 tablet (40 mg total) by mouth daily.   HYDROcodone-acetaminophen 5-325 MG tablet Commonly known as: NORCO/VICODIN Take 1 tablet by mouth every 6 (six) hours as needed. What changed: reasons to take this   potassium chloride SA 20 MEQ tablet Commonly known as: KLOR-CON Take 1 tablet (20 mEq total) by mouth daily.   predniSONE 10 MG tablet Commonly known as: DELTASONE Take 4 tablets (40 mg total) by mouth daily for 3 days, THEN 3 tablets (30 mg total) daily for 3 days, THEN 2 tablets (20 mg total) daily for 3 days, THEN 1 tablet (10 mg total) daily for 3 days. Start taking on: September 06, 2020            Discharge Care Instructions  (From admission, onward)         Start     Ordered   09/06/20 0000  Discharge wound care:       Comments: Cover any areas of the LEs that are weeping or open with single layer of xeroform gauze. Top with dry dressing and secure with kerlix.  Wrap legs from toes to knees with 4" ACE wraps with slight tension. Change daily. Bedside nurses to perform   09/06/20 1411          Allergies  Allergen Reactions  . Sulfa Antibiotics Nausea And Vomiting    Consultations:  None   Procedures/Studies: CT Angio Chest PE W and/or Wo Contrast  Addendum Date: 09/06/2020   ADDENDUM REPORT: 09/06/2020 00:03 ADDENDUM: These results were called by telephone at the time of interpretation on 09/06/2020 at 12:03 am to provider Dr. Marlyce Huge, who verbally acknowledged these results. Electronically Signed   By: Lovena Le M.D.   On: 09/06/2020 00:03   Result Date: 09/06/2020 CLINICAL DATA:  Cough and fever for 3 days, concern for pulmonary embolism. History of rectal cancer, sleep apnea and hypertension EXAM: CT ANGIOGRAPHY CHEST WITH CONTRAST TECHNIQUE: Multidetector CT imaging of the chest was performed using the  standard protocol during bolus administration of intravenous contrast. Multiplanar CT image reconstructions and MIPs were obtained to evaluate the vascular anatomy. CONTRAST:  35mL OMNIPAQUE IOHEXOL 350 MG/ML SOLN COMPARISON:  Radiograph 09/05/2020, CT 02/28/2020 FINDINGS: Cardiovascular: Satisfactory opacification of pulmonary arteries though evaluation beyond the lobar level is limited by respiratory motion artifact. No central or lobar filling defects are identified. Central pulmonary arteries are normal caliber. Mild cardiomegaly with predominantly biatrial enlargement. Coronary artery calcifications are present. Few calcifications are present on the aortic leaflets as well. Atherosclerotic plaque within the normal caliber aorta. Normal 3 vessel branching of the aortic arch. Proximal great vessels are unremarkable. Major venous abnormalities. Mediastinum/Nodes: No mediastinal fluid or gas. Normal thyroid  gland and thoracic inlet. No acute abnormality of the trachea or esophagus. Numerous scattered partially calcified mediastinal and hilar nodes are present. Stable appearance of index nodes including a 18 mm right paratracheal node (5/24), a 1.1 cm left supraclavicular node (5/6), and a 1.3 subcarinal node (5/46). No worrisome enlarged or enlarging nodes seen in the chest or axilla. Lungs/Pleura: Low volumes and atelectatic changes. Some additional mixed areas patchy ground-glass, consolidation and bronchitic changes throughout the lungs. No pneumothorax. No effusion. Minimal chronic scarring towards the bases is similar to priors. No pneumothorax. No effusion. Enlarging appearance of a nodule in the left lower lobe now measuring up to 10 mm in size. And anterior right middle lobe nodule is seen now measuring up to 4 mm, previously 2 mm (7/56). A previously identified nodule in the more posterior right middle lobe is not as well visualized possibly due to motion and atelectatic change. Few clustered solid nodules  are present in the anterior segment right upper lobe as well (7/48). Upper Abdomen: Nodular hepatic surface contour and left lobe hypertrophy compatible with cirrhosis. No acute abnormalities present in the visualized portions of the upper abdomen. Musculoskeletal: Multilevel degenerative changes are present in the imaged portions of the spine. No acute osseous abnormality or suspicious osseous lesion. Review of the MIP images confirms the above findings. IMPRESSION: 1. Satisfactory opacification of pulmonary arteries though evaluation beyond the lobar level is limited by respiratory motion artifact. No central or lobar filling defects are identified. 2. Low volumes and atelectatic changes throughout the lungs. Some additional mixed areas patchy ground-glass, consolidation and bronchitic changes throughout the lungs, could reflect a multifocal infectious/inflammatory process including potential atypical viral etiologies. 3. Multiple new and enlarging pulmonary nodules in the lungs while some of this may be related to the acute infection or inflammation, increase in size of previous identified nodules is concerning for progressive metastatic disease particularly given patient's known rectal malignancy. 4. Stable partially calcified mediastinal and hilar nodes, nonspecific, possibly related to prior granulomatous disease or treated lymphoma. 5. Cirrhosis. 6. Aortic Atherosclerosis (ICD10-I70.0). Currently attempting to contact the ordering provider with a critical value result. Addendum will be submitted upon case discussion. Electronically Signed: By: Kreg Shropshire M.D. On: 09/05/2020 23:51   DG Chest Portable 1 View  Result Date: 09/05/2020 CLINICAL DATA:  Cough and fever EXAM: PORTABLE CHEST 1 VIEW COMPARISON:  10/15/2019 FINDINGS: Cardiomegaly. Hazy and patchy airspace opacities in the left greater than right lung bases. Aortic atherosclerosis. No pneumothorax. IMPRESSION: Hazy and patchy airspace opacities in  the left greater than right lung bases, suspicious for pneumonia. Electronically Signed   By: Jasmine Pang M.D.   On: 09/05/2020 19:57      Subjective: No acute issues or events overnight, patient continues to complain of fatigue and weakness but denies overt chest pain, shortness of breath dyspnea on exertion nausea vomiting diarrhea constipation headache fevers or chills.   Discharge Exam: Vitals:   09/06/20 1105 09/06/20 1230  BP: (!) 135/95 135/77  Pulse: 68 (!) 51  Resp:  16  Temp:    SpO2: 95% 93%   Vitals:   09/06/20 1015 09/06/20 1030 09/06/20 1105 09/06/20 1230  BP: 133/71 132/69 (!) 135/95 135/77  Pulse: (!) 52 (!) 53 68 (!) 51  Resp: (!) 27 (!) 26  16  Temp:      TempSrc:      SpO2: 91% (!) 88% 95% 93%    General:  Pleasantly resting in bed, No acute distress. HEENT:  Normocephalic atraumatic.  Sclerae nonicteric, noninjected.  Extraocular movements intact bilaterally. Neck:  Without mass or deformity.  Trachea is midline. Lungs:  Clear to auscultate bilaterally without rhonchi, wheeze, or rales. Heart:  Regular rate and rhythm.  Without murmurs, rubs, or gallops. Abdomen:  Soft, nontender, nondistended.  Scant ascites without rebound or guarding. Extremities: Without cyanosis, clubbing, edema, or obvious deformity. Vascular:  Dorsalis pedis and posterior tibial pulses palpable bilaterally. Skin: Chronic bilateral lower extremity skin changes consistent with chronic venous stasis without edema, erythema or cellulitis, no open wounds or draining wounds on exam     The results of significant diagnostics from this hospitalization (including imaging, microbiology, ancillary and laboratory) are listed below for reference.     Microbiology: Recent Results (from the past 240 hour(s))  SARS CORONAVIRUS 2 (TAT 6-24 HRS) Nasopharyngeal Nasopharyngeal Swab     Status: Abnormal   Collection Time: 09/05/20  3:43 PM   Specimen: Nasopharyngeal Swab  Result Value Ref Range  Status   SARS Coronavirus 2 POSITIVE (A) NEGATIVE Final    Comment: (NOTE) SARS-CoV-2 target nucleic acids are DETECTED.  The SARS-CoV-2 RNA is generally detectable in upper and lower respiratory specimens during the acute phase of infection. Positive results are indicative of the presence of SARS-CoV-2 RNA. Clinical correlation with patient history and other diagnostic information is  necessary to determine patient infection status. Positive results do not rule out bacterial infection or co-infection with other viruses.  The expected result is Negative.  Fact Sheet for Patients: SugarRoll.be  Fact Sheet for Healthcare Providers: https://www.woods-mathews.com/  This test is not yet approved or cleared by the [place] FDA and  has been authorized for detection and/or diagnosis of SARS-CoV-2 by FDA under an Emergency Use Authorization (EUA). This EUA will remain  in effect (meaning this test can be used) for the duration of the COVID-19 declaration under Section 564(b)(1) of the Act, 21 U. S.C. section 360bbb-3(b)(1), unless the authorization is terminated or revoked sooner.   Performed at [facility], [place] [address] [place], [place] [address]      Labs: BNP (last 3 results) Recent Labs    05/07/20 1911 09/05/20 2032  BNP 82.2 [id number]*   Basic Metabolic Panel: Recent Labs  Lab 09/05/20 2032  NA 139  K 3.5  CL 108  CO2 18*  GLUCOSE 84  BUN 12  CREATININE 0.89  CALCIUM 7.9*   Liver Function Tests: Recent Labs  Lab 09/05/20 2032  AST 39  ALT 17  ALKPHOS 66  BILITOT 1.0  PROT 7.1  ALBUMIN 2.9*   No results for input(s): LIPASE, AMYLASE in the last 168 hours. No results for input(s): AMMONIA in the last 168 hours. CBC: Recent Labs  Lab 09/05/20 2032  WBC 1.4*  NEUTROABS 0.8*  HGB 12.5*  HCT 38.5*  MCV 97.7  PLT 62*   Cardiac Enzymes: No results for input(s): CKTOTAL, CKMB, CKMBINDEX, TROPONINI  in the last 168 hours. BNP: Invalid input(s): POCBNP CBG: No results for input(s): GLUCAP in the last 168 hours. D-Dimer No results for input(s): DDIMER in the last 72 hours. Hgb A1c No results for input(s): HGBA1C in the last 72 hours. Lipid Profile No results for input(s): CHOL, HDL, LDLCALC, TRIG, CHOLHDL, LDLDIRECT in the last 72 hours. Thyroid function studies No results for input(s): TSH, T4TOTAL, T3FREE, THYROIDAB in the last 72 hours.  Invalid input(s): FREET3 Anemia work up No results for input(s): VITAMINB12, FOLATE, FERRITIN, TIBC, IRON, RETICCTPCT in the last 41  hours. Urinalysis    Component Value Date/Time   COLORURINE AMBER (A) 09/05/2020 2048   APPEARANCEUR CLEAR 09/05/2020 2048   LABSPEC 1.024 09/05/2020 2048   PHURINE 5.0 09/05/2020 2048   GLUCOSEU NEGATIVE 09/05/2020 2048   HGBUR MODERATE (A) 09/05/2020 2048   BILIRUBINUR NEGATIVE 09/05/2020 2048   KETONESUR 20 (A) 09/05/2020 2048   PROTEINUR 100 (A) 09/05/2020 2048   UROBILINOGEN 0.2 02/09/2014 1954   NITRITE NEGATIVE 09/05/2020 2048   LEUKOCYTESUR NEGATIVE 09/05/2020 2048   Sepsis Labs Invalid input(s): PROCALCITONIN,  WBC,  LACTICIDVEN Microbiology Recent Results (from the past 240 hour(s))  SARS CORONAVIRUS 2 (TAT 6-24 HRS) Nasopharyngeal Nasopharyngeal Swab     Status: Abnormal   Collection Time: 09/05/20  3:43 PM   Specimen: Nasopharyngeal Swab  Result Value Ref Range Status   SARS Coronavirus 2 POSITIVE (A) NEGATIVE Final    Comment: (NOTE) SARS-CoV-2 target nucleic acids are DETECTED.  The SARS-CoV-2 RNA is generally detectable in upper and lower respiratory specimens during the acute phase of infection. Positive results are indicative of the presence of SARS-CoV-2 RNA. Clinical correlation with patient history and other diagnostic information is  necessary to determine patient infection status. Positive results do not rule out bacterial infection or co-infection with other viruses.  The  expected result is Negative.  Fact Sheet for Patients: SugarRoll.be  Fact Sheet for Healthcare Providers: https://www.woods-mathews.com/  This test is not yet approved or cleared by the [place] FDA and  has been authorized for detection and/or diagnosis of SARS-CoV-2 by FDA under an Emergency Use Authorization (EUA). This EUA will remain  in effect (meaning this test can be used) for the duration of the COVID-19 declaration under Section 564(b)(1) of the Act, 21 U. S.C. section 360bbb-3(b)(1), unless the authorization is terminated or revoked sooner.   Performed at [facility], [place] [address] [place], [place] [address]      Time coordinating discharge: Over 30 minutes  SIGNED:   Little Ishikawa, DO [facility] 09/06/2020, 2:12 PM Pager   If 7PM-7AM, please contact night-coverage [email or web address]

## 2020-09-06 NOTE — ED Notes (Signed)
This RN went in to give meds to pt. Pt asked for some tylenol for lower back pain. This RN found that pt pulled his [place] off. This RN educated pt on importance of wearing O2 at the time and educated pt on what we would like his O2 level to be at. This RN offered to put O2 back on pt. Pt refusing to wear O2.

## 2020-09-06 NOTE — ED Notes (Signed)
Dinner Tray Ordered @ 1733. 

## 2020-09-06 NOTE — Progress Notes (Signed)
Pharmacy Antibiotic Note  Eric Schultz is a 80 y.o. male admitted on 09/05/2020 with COVID-19.  Pharmacy has been consulted for Vancomycin/Cefepime dosing for lower extremity cellulitis. Pt leukopenic with WBC 1.4. Pt has untreated metastatic rectal cancer. Renal function ok.   Plan: Vancomycin 2000 mg IV q24h >>Estimated AUC: 465 Cefepime 2g IV q8h Trend WBC, temp, renal function  F/U infectious work-up Drug levels as indicated  Temp (24hrs), Avg:99.3 F (37.4 C), Min:98.6 F (37 C), Max:100 F (37.8 C)  Recent Labs  Lab 09/05/20 2032  WBC 1.4*  CREATININE 0.89    CrCl cannot be calculated (Unknown ideal weight.).    Allergies  Allergen Reactions  . Sulfa Antibiotics Nausea And Vomiting    [name], PharmD, BCPS Clinical Pharmacist Phone: [phone]

## 2020-09-06 NOTE — ED Notes (Signed)
O2 with limited ambulation at bedside is 94%

## 2020-09-06 NOTE — ED Notes (Signed)
Patient eating breakfast. Has agreed to "stay half the day"

## 2020-09-06 NOTE — Consult Note (Signed)
I have placed a request via Secure Chat to Dr.L[place]aster  requesting photos of the wound areas of concern to be placed in the EMR.    [place], [place], [place]

## 2020-09-07 LAB — PATHOLOGIST SMEAR REVIEW

## 2020-09-11 LAB — CULTURE, BLOOD (ROUTINE X 2)
Culture: NO GROWTH
Special Requests: ADEQUATE

## 2020-09-13 ENCOUNTER — Encounter (HOSPITAL_COMMUNITY): Payer: Self-pay

## 2020-09-13 ENCOUNTER — Other Ambulatory Visit: Payer: Self-pay

## 2020-09-13 DIAGNOSIS — Z79899 Other long term (current) drug therapy: Secondary | ICD-10-CM | POA: Insufficient documentation

## 2020-09-13 DIAGNOSIS — I10 Essential (primary) hypertension: Secondary | ICD-10-CM | POA: Diagnosis not present

## 2020-09-13 DIAGNOSIS — Z85048 Personal history of other malignant neoplasm of rectum, rectosigmoid junction, and anus: Secondary | ICD-10-CM | POA: Diagnosis not present

## 2020-09-13 DIAGNOSIS — N200 Calculus of kidney: Secondary | ICD-10-CM | POA: Diagnosis not present

## 2020-09-13 DIAGNOSIS — K625 Hemorrhage of anus and rectum: Secondary | ICD-10-CM | POA: Diagnosis not present

## 2020-09-13 DIAGNOSIS — Z8616 Personal history of COVID-19: Secondary | ICD-10-CM | POA: Diagnosis not present

## 2020-09-13 DIAGNOSIS — R197 Diarrhea, unspecified: Secondary | ICD-10-CM | POA: Diagnosis not present

## 2020-09-13 DIAGNOSIS — C799 Secondary malignant neoplasm of unspecified site: Secondary | ICD-10-CM | POA: Diagnosis not present

## 2020-09-13 DIAGNOSIS — K8020 Calculus of gallbladder without cholecystitis without obstruction: Secondary | ICD-10-CM | POA: Diagnosis not present

## 2020-09-13 LAB — COMPREHENSIVE METABOLIC PANEL
ALT: 27 U/L (ref 0–44)
AST: 41 U/L (ref 15–41)
Albumin: 3.5 g/dL (ref 3.5–5.0)
Alkaline Phosphatase: 67 U/L (ref 38–126)
Anion gap: 9 (ref 5–15)
BUN: 12 mg/dL (ref 8–23)
CO2: 24 mmol/L (ref 22–32)
Calcium: 8.7 mg/dL — ABNORMAL LOW (ref 8.9–10.3)
Chloride: 107 mmol/L (ref 98–111)
Creatinine, Ser: 1.02 mg/dL (ref 0.61–1.24)
GFR, Estimated: >60 mL/min (ref >60)
Glucose, Bld: 100 mg/dL — ABNORMAL HIGH (ref 70–99)
Potassium: 3.2 mmol/L — ABNORMAL LOW (ref 3.5–5.1)
Sodium: 140 mmol/L (ref 135–145)
Total Bilirubin: 1.4 mg/dL — ABNORMAL HIGH (ref 0.3–1.2)
Total Protein: 7.5 g/dL (ref 6.5–8.1)

## 2020-09-13 LAB — CBC
HCT: 36.5 % — ABNORMAL LOW (ref 39.0–52.0)
Hemoglobin: 12.7 g/dL — ABNORMAL LOW (ref 13.0–17.0)
MCH: 32.9 pg (ref 26.0–34.0)
MCHC: 34.8 g/dL (ref 30.0–36.0)
MCV: 94.6 fL (ref 80.0–100.0)
Platelets: 104 10*3/uL — ABNORMAL LOW (ref 150–400)
RBC: 3.86 MIL/uL — ABNORMAL LOW (ref 4.22–5.81)
RDW: 14.1 % (ref 11.5–15.5)
WBC: 4 10*3/uL (ref 4.0–10.5)
nRBC: 0 % (ref 0.0–0.2)

## 2020-09-13 LAB — LIPASE, BLOOD: Lipase: 34 U/L (ref 11–51)

## 2020-09-13 NOTE — ED Triage Notes (Signed)
Pt arrived via walk in, c/o diarrhea x3 weeks. COVID (+) 09/05/20. Denies any other sx.

## 2020-09-14 ENCOUNTER — Emergency Department (HOSPITAL_COMMUNITY)
Admission: EM | Admit: 2020-09-14 | Discharge: 2020-09-14 | Disposition: A | Discharge status: Home / Self Care | Payer: No Typology Code available for payment source | Attending: Emergency Medicine | Admitting: Emergency Medicine

## 2020-09-14 ENCOUNTER — Encounter (HOSPITAL_COMMUNITY): Payer: Self-pay

## 2020-09-14 ENCOUNTER — Emergency Department (HOSPITAL_COMMUNITY): Payer: No Typology Code available for payment source

## 2020-09-14 DIAGNOSIS — N200 Calculus of kidney: Secondary | ICD-10-CM | POA: Diagnosis not present

## 2020-09-14 DIAGNOSIS — R197 Diarrhea, unspecified: Secondary | ICD-10-CM | POA: Diagnosis not present

## 2020-09-14 DIAGNOSIS — K8020 Calculus of gallbladder without cholecystitis without obstruction: Secondary | ICD-10-CM | POA: Diagnosis not present

## 2020-09-14 DIAGNOSIS — C799 Secondary malignant neoplasm of unspecified site: Secondary | ICD-10-CM | POA: Diagnosis not present

## 2020-09-14 DIAGNOSIS — K625 Hemorrhage of anus and rectum: Secondary | ICD-10-CM

## 2020-09-14 MED ORDER — IOHEXOL 300 MG/ML  SOLN
100.0000 mL | Freq: Once | INTRAMUSCULAR | Status: AC | PRN
  Administered 2020-09-14: 100 mL via INTRAVENOUS

## 2020-09-14 MED ORDER — POTASSIUM CHLORIDE CRYS ER 20 MEQ PO TBCR
40.0000 meq | EXTENDED_RELEASE_TABLET | Freq: Once | ORAL | Status: AC
  Administered 2020-09-14: 40 meq via ORAL
  Filled 2020-09-14: qty 2

## 2020-09-14 NOTE — ED Notes (Addendum)
Patient removed gown and dressed back in his clothing. Patient irritated with RN stating the MD has not been in to see him. RN explained that the MD will come speak to patient following CT scan. Patient asking why he is having a CT scan and why MD did not tell him. RN attempted to get patient to get back into gown. Patient stated no he "was not a [place] pig"

## 2020-09-14 NOTE — ED Provider Notes (Signed)
[facility] Provider Note: Lowella Dell, MD, FACEP  CSN: [id number] MRN: [id number] ARRIVAL: 09/13/20 at 1430 ROOM: WTR4/WLPT4   CHIEF COMPLAINT  Diarrhea   HISTORY OF PRESENT ILLNESS  09/14/20 5:01 AM Eric Schultz is a 80 y.o. male with several months of diarrhea.  He describes the diarrhea as watery as well as bloody.  It has not been associated with abdominal pain, fever, chills, nausea or vomiting.  Nothing makes it better or worse.  He is here today, after waiting for over 14 hours in the waiting room, because he is tired of soiling his clothing and he wants to know why this is happening.  He states he incidentally tested positive for COVID on 09/05/2020 but states he is only COVID-related symptom is a mild cough.  He denies shortness of breath.   Past Medical History:  Diagnosis Date   Allergy    Cellulitis    left lower leg   Cellulitis and abscess of left leg 05/09/2020   Gout    HOH (hard of hearing)    Hypertension    "borderline"   Rectal cancer (HCC)    Sleep apnea     History reviewed. No pertinent surgical history.  Family History  Problem Relation Age of Onset   Heart failure Mother    Heart failure Father    Stroke Father    Colon cancer Neg Hx    Esophageal cancer Neg Hx    Rectal cancer Neg Hx    Stomach cancer Neg Hx     Social History   Tobacco Use   Smoking status: Never Smoker   Smokeless tobacco: Never Used  Vaping Use   Vaping Use: Never used  Substance Use Topics   Alcohol use: No   Drug use: No    Prior to Admission medications   Medication Sig Start Date End Date Taking? Authorizing Provider  furosemide (LASIX) 40 MG tablet Take 1 tablet (40 mg total) by mouth daily. 05/09/20 05/09/21  [place], Eric J, DO  HYDROcodone-acetaminophen (NORCO/VICODIN) 5-325 MG tablet Take 1 tablet by mouth every 6 (six) hours as needed. Patient taking differently: Take 1 tablet by mouth every 6 (six) hours as needed  for severe pain. 05/30/20   Mardella Layman, MD  potassium chloride SA (KLOR-CON) 20 MEQ tablet Take 1 tablet (20 mEq total) by mouth daily. 04/28/20   Rolan Bucco, MD  predniSONE (DELTASONE) 10 MG tablet Take 4 tablets (40 mg total) by mouth daily for 3 days, THEN 3 tablets (30 mg total) daily for 3 days, THEN 2 tablets (20 mg total) daily for 3 days, THEN 1 tablet (10 mg total) daily for 3 days. 09/06/20 09/18/20  Azucena Fallen, MD    Allergies Sulfa antibiotics   REVIEW OF SYSTEMS  Negative except as noted here or in the History of Present Illness.   PHYSICAL EXAMINATION  Initial Vital Signs Blood pressure (!) 149/90, pulse 93, temperature 97.7 F (36.5 C), temperature source Oral, resp. rate 16, SpO2 100 %.  Examination General: Well-developed, well-nourished male in no acute distress; appearance consistent with age of record HENT: normocephalic; atraumatic Eyes: pupils equal, round and reactive to light; extraocular muscles intact Neck: supple Heart: regular rate and rhythm Lungs: clear to auscultation bilaterally Abdomen: soft; nondistended; nontender; bowel sounds present Extremities: No deformity; full range of motion Neurologic: Awake, alert and oriented; motor function intact in all extremities and symmetric; no facial droop Skin: Warm and dry Psychiatric: Flat affect  RESULTS  Summary of this visit's results, reviewed and interpreted by myself:   EKG Interpretation  Date/Time:    Ventricular Rate:    PR Interval:    QRS Duration:   QT Interval:    QTC Calculation:   R Axis:     Text Interpretation:        Laboratory Studies: No results found for this or any previous visit (from the past 24 hour(s)). Imaging Studies: CT ABDOMEN PELVIS W CONTRAST  Result Date: 09/14/2020 CLINICAL DATA:  Diarrhea for 3 weeks. COVID positive 09/05/2020. Rectal cancer diagnosed last July. EXAM: CT ABDOMEN AND PELVIS WITH CONTRAST TECHNIQUE: Multidetector CT imaging of  the abdomen and pelvis was performed using the standard protocol following bolus administration of intravenous contrast. CONTRAST:  174mL OMNIPAQUE IOHEXOL 300 MG/ML  SOLN COMPARISON:  02/28/2020 FINDINGS: Lower chest: Interstitial lung disease. Bibasilar pulmonary nodules. A 1.0 cm nodule on 24/4 is enlarged from 02/28/2020, similar to the 09/05/2020. 4 mm right middle lobe pulmonary nodule on 06/04. Mild cardiomegaly, without pericardial or pleural effusion. Right coronary artery calcification. Hyperattenuation, presumably contrast in the dependent esophagus on 10/02. Hepatobiliary: Advanced cirrhosis, without focal liver lesion. Multiple gallstones on the order of 6 mm. No acute cholecystitis or biliary duct dilatation. Pancreas: Normal, without mass or ductal dilatation. Spleen: Splenomegaly, 17.2 cm craniocaudal. Adrenals/Urinary Tract: Normal adrenal glands. Mild renal cortical thinning bilaterally. Left larger than right fluid density renal lesions are likely cysts. Maximally 4.8 cm on the left. Too small to characterize lesions. No hydronephrosis. Punctate right renal collecting system calculi. Normal urinary bladder. Stomach/Bowel: Normal stomach, without wall thickening. Residual rectal wall thickening, accentuated by underdistention on 80/2. Scattered colonic diverticula. Normal terminal ileum and appendix. Normal small bowel. Vascular/Lymphatic: Aortic atherosclerosis. Recannulized paraumbilical vein including on 25/2. Patent portal and splenic veins. Right external iliac index node measures 1.3 cm on 75/2 versus 1.5 cm on the prior. Left external iliac node measures 1.2 cm on 70/2 versus 1.4 cm on the prior (when remeasured). Left common iliac node measures 1.1 cm on 53/2 and is similar to on the prior exam (when remeasured). Reproductive: Mild prostatomegaly. Other: No significant free fluid. Lipoma within the portal caval space, including at 5.2 cm on 29/2, similar. No evidence of omental or  peritoneal disease. Fat containing ventral abdominal wall hernia is similar. Musculoskeletal: No acute osseous abnormality. IMPRESSION: 1. No specific explanation for diarrhea. 2. 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 3. Residual rectal wall thickening in this patient with a history of rectal cancer. 4. Pelvic adenopathy, minimally decreased compared to 02/28/2020. Correlate with interval therapy. 5. Pulmonary nodules, enlarged compared to 02/28/2020. Metastatic disease remains a concern. 6. Esophageal air fluid level suggests dysmotility or gastroesophageal reflux. 7. Coronary artery atherosclerosis. Aortic Atherosclerosis (ICD10-I70.0). 8. Cholelithiasis. 9. Right nephrolithiasis. Electronically Signed   By: Abigail Miyamoto M.D.   On: 09/14/2020 09:10    ED COURSE and MDM  Nursing notes, initial and subsequent vitals signs, including pulse oximetry, reviewed and interpreted by myself.  Vitals:   09/13/20 1933 09/14/20 0534 09/14/20 0900 09/14/20 0953  BP: (!) 149/90 (!) 119/91 (!) 157/79 (!) 146/93  Pulse: 93 76 69 77  Resp: 16 18 20 17   Temp:      TempSrc:      SpO2: 100% 95% 99% 97%   Medications  potassium chloride SA (KLOR-CON) CR tablet 40 mEq (40 mEq Oral Given 09/14/20 0804)  iohexol (OMNIPAQUE) 300 MG/ML solution 100 mL (100 mLs Intravenous Contrast Given 09/14/20  0812)    5:18 AM The patient states that his bloody diarrhea has been going on for months without abdominal pain or other symptoms.  His H&H are within normal limits without showing significant anemia or microcytosis.  His potassium is only slightly low at 3.2.  The most important first step in diagnosing his chronic diarrhea is stool studies and he has been asked to provide a specimen.  He denies any antibiotic use in the last 6 months.  6:39 AM A review of the patient's ED visit from 09/05/2020 shows that the patient has a history of rectal cancer but alleged he was not aware of it.  There may be an element of  denial on the part of the patient.  He did not meet admission criteria for COVID-19 at that time and was discharged home.  His last CT scan of the abdomen and pelvis was July 2021.  We will obtain a repeat CT scan.  7:00 AM Signed out to Dr. Tyrone Nine.  PROCEDURES  Procedures   ED DIAGNOSES     ICD-10-CM   1. Rectal bleeding  K62.5        Shanon Rosser, MD 09/14/20 2242

## 2020-09-14 NOTE — ED Provider Notes (Signed)
I received the patient in signout from Dr. Florina Ou briefly the patient is a 80 year old male with previously seen rectal mass has followed up with GI and oncology.  Here with persistent rectal bleeding.  Wants to know why he has continued to have rectal bleeding.  Hemoglobin here is stable.  CT scan similar to prior.  Will discharge home.  GI oncology follow-up.   Deno Etienne, DO 09/14/20 1342

## 2020-09-14 NOTE — Discharge Instructions (Signed)
The most likely cause for your continued rectal bleeding is from the presumed cancer that you have.  Please follow-up with your doctors that are trying to help you take care of this.  Dr. Annamaria Boots is the oncologist that you have seen in the past and you had also seen Dr. Silverio Decamp.  Please return to the ED for worsening abdominal pain fever or inability to eat or drink.  Also return for worsening bleeding if you feel lightheaded or dizzy or if you pass out.

## 2020-10-25 DIAGNOSIS — K921 Melena: Secondary | ICD-10-CM | POA: Diagnosis not present

## 2020-10-25 DIAGNOSIS — K625 Hemorrhage of anus and rectum: Secondary | ICD-10-CM | POA: Diagnosis not present

## 2020-10-25 DIAGNOSIS — Z20822 Contact with and (suspected) exposure to covid-19: Secondary | ICD-10-CM | POA: Diagnosis not present

## 2020-12-14 DIAGNOSIS — M7989 Other specified soft tissue disorders: Secondary | ICD-10-CM | POA: Diagnosis not present

## 2020-12-14 DIAGNOSIS — I878 Other specified disorders of veins: Secondary | ICD-10-CM | POA: Diagnosis not present

## 2020-12-14 DIAGNOSIS — K766 Portal hypertension: Secondary | ICD-10-CM | POA: Diagnosis not present

## 2020-12-14 DIAGNOSIS — C19 Malignant neoplasm of rectosigmoid junction: Secondary | ICD-10-CM | POA: Diagnosis not present

## 2020-12-14 DIAGNOSIS — R600 Localized edema: Secondary | ICD-10-CM | POA: Diagnosis not present

## 2020-12-14 DIAGNOSIS — H9190 Unspecified hearing loss, unspecified ear: Secondary | ICD-10-CM | POA: Diagnosis not present

## 2020-12-14 DIAGNOSIS — K922 Gastrointestinal hemorrhage, unspecified: Secondary | ICD-10-CM | POA: Diagnosis not present

## 2020-12-14 DIAGNOSIS — R590 Localized enlarged lymph nodes: Secondary | ICD-10-CM | POA: Diagnosis not present

## 2020-12-14 DIAGNOSIS — K7460 Unspecified cirrhosis of liver: Secondary | ICD-10-CM | POA: Diagnosis not present

## 2020-12-14 DIAGNOSIS — C779 Secondary and unspecified malignant neoplasm of lymph node, unspecified: Secondary | ICD-10-CM | POA: Diagnosis not present

## 2020-12-14 DIAGNOSIS — D6489 Other specified anemias: Secondary | ICD-10-CM | POA: Diagnosis not present

## 2020-12-14 DIAGNOSIS — Z20822 Contact with and (suspected) exposure to covid-19: Secondary | ICD-10-CM | POA: Diagnosis not present

## 2020-12-14 DIAGNOSIS — K625 Hemorrhage of anus and rectum: Secondary | ICD-10-CM | POA: Diagnosis not present

## 2020-12-15 DIAGNOSIS — K7460 Unspecified cirrhosis of liver: Secondary | ICD-10-CM | POA: Diagnosis not present

## 2020-12-15 DIAGNOSIS — D696 Thrombocytopenia, unspecified: Secondary | ICD-10-CM | POA: Diagnosis not present

## 2020-12-15 DIAGNOSIS — C20 Malignant neoplasm of rectum: Secondary | ICD-10-CM | POA: Diagnosis not present

## 2020-12-15 DIAGNOSIS — M7989 Other specified soft tissue disorders: Secondary | ICD-10-CM | POA: Diagnosis not present

## 2020-12-15 DIAGNOSIS — K625 Hemorrhage of anus and rectum: Secondary | ICD-10-CM | POA: Diagnosis not present

## 2020-12-15 DIAGNOSIS — K766 Portal hypertension: Secondary | ICD-10-CM | POA: Diagnosis not present

## 2020-12-15 DIAGNOSIS — K922 Gastrointestinal hemorrhage, unspecified: Secondary | ICD-10-CM | POA: Diagnosis not present

## 2020-12-15 DIAGNOSIS — R600 Localized edema: Secondary | ICD-10-CM | POA: Diagnosis not present

## 2020-12-16 DIAGNOSIS — R600 Localized edema: Secondary | ICD-10-CM | POA: Diagnosis not present

## 2020-12-16 DIAGNOSIS — K625 Hemorrhage of anus and rectum: Secondary | ICD-10-CM | POA: Diagnosis not present

## 2020-12-16 DIAGNOSIS — C20 Malignant neoplasm of rectum: Secondary | ICD-10-CM | POA: Diagnosis not present

## 2020-12-16 DIAGNOSIS — K7460 Unspecified cirrhosis of liver: Secondary | ICD-10-CM | POA: Diagnosis not present

## 2020-12-16 DIAGNOSIS — K922 Gastrointestinal hemorrhage, unspecified: Secondary | ICD-10-CM | POA: Diagnosis not present

## 2020-12-16 DIAGNOSIS — D696 Thrombocytopenia, unspecified: Secondary | ICD-10-CM | POA: Diagnosis not present

## 2021-01-02 IMAGING — CT CT RENAL STONE PROTOCOL
2 of 4 series · 16 of 46 positions shown, 18 images · non-contrast
Comparison: None.

CLINICAL DATA: Bilateral flank pain.

EXAM:
CT ABDOMEN AND PELVIS WITHOUT CONTRAST
TECHNIQUE: Multidetector CT imaging of the abdomen and pelvis was performed
following the standard protocol without IV contrast.

[Series 3: stone study 5.0 i30f 2 · axial · 0.98mm/px · z∈[+644,+1114]mm · 13 of 102 slices shown, 15 images]
[im 4/102  soft-tissue]
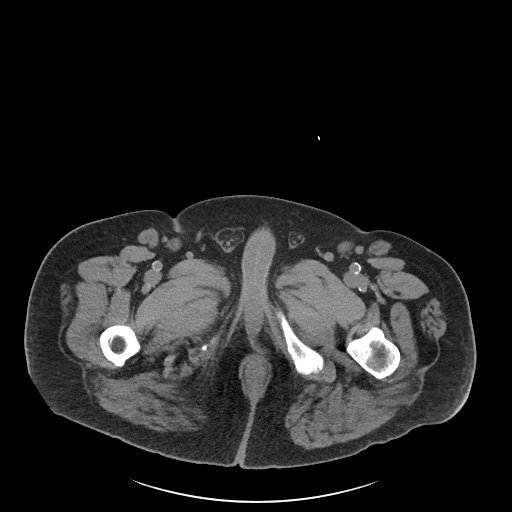
[im 4/102  bone]
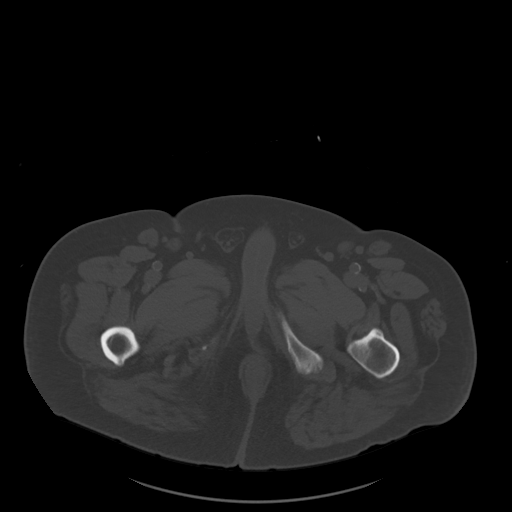
[im 12/102  soft-tissue]
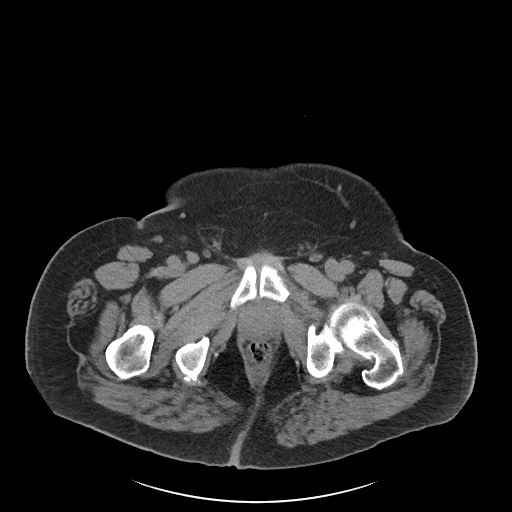
[im 20/102  soft-tissue]
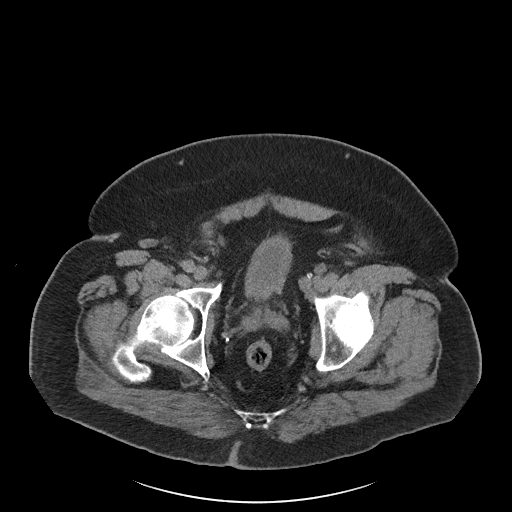
[im 28/102  soft-tissue]
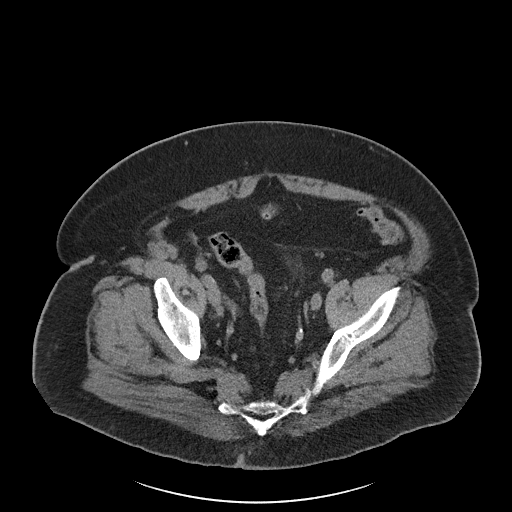
[im 35/102  soft-tissue]
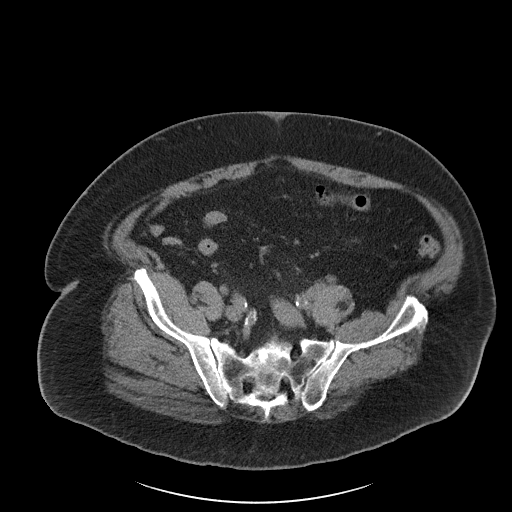
[im 43/102  soft-tissue]
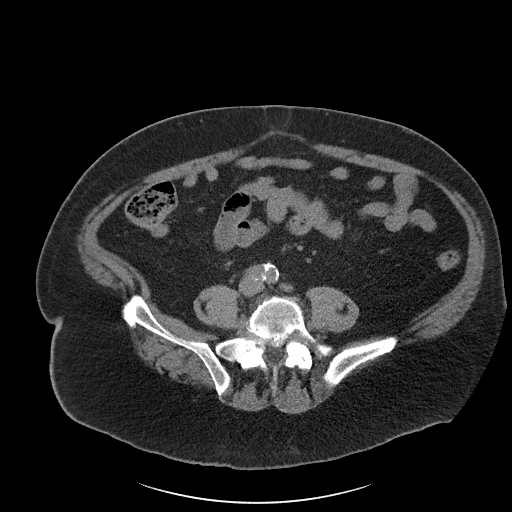
[im 51/102  soft-tissue]
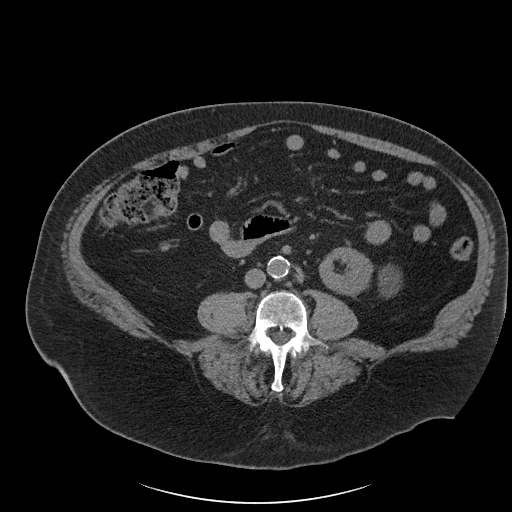
[im 59/102  soft-tissue]
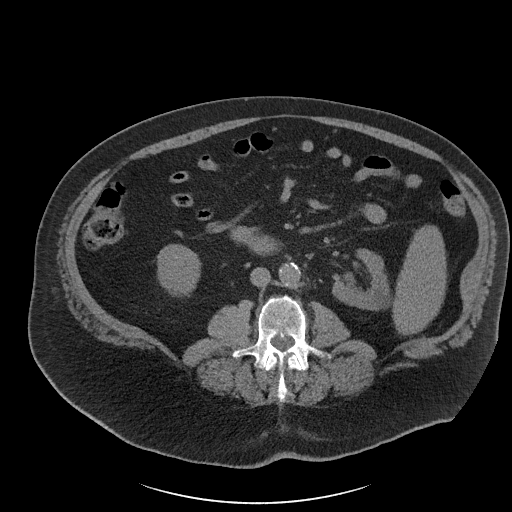
[im 67/102  soft-tissue]
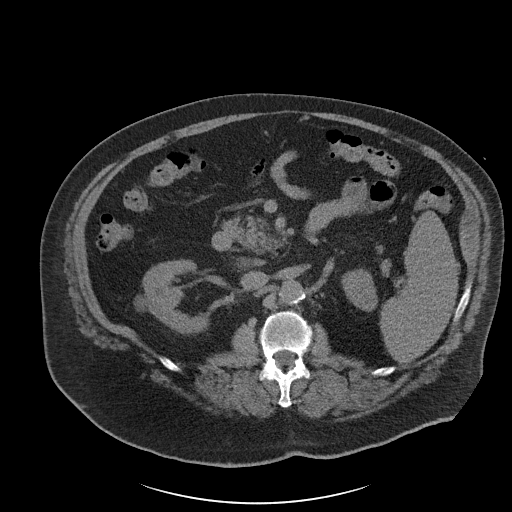
[im 67/102  bone]
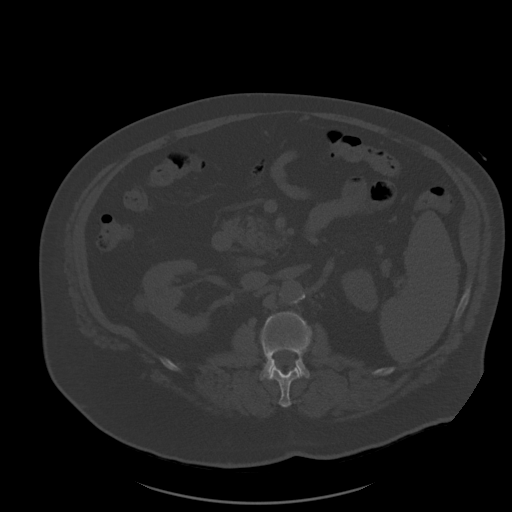
[im 74/102  soft-tissue]
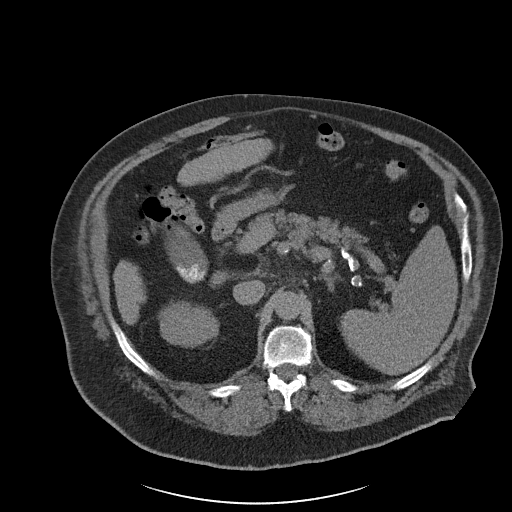
[im 82/102  soft-tissue]
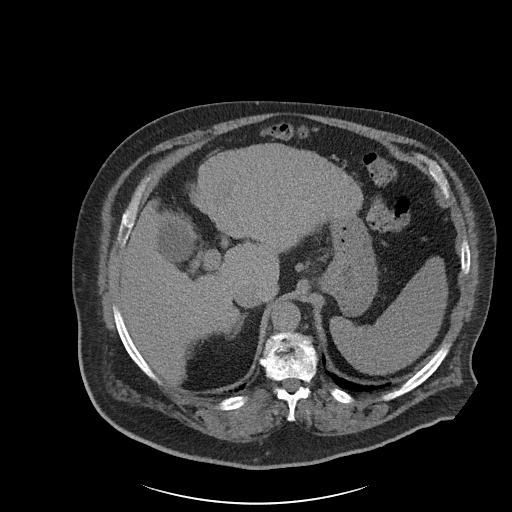
[im 90/102  soft-tissue]
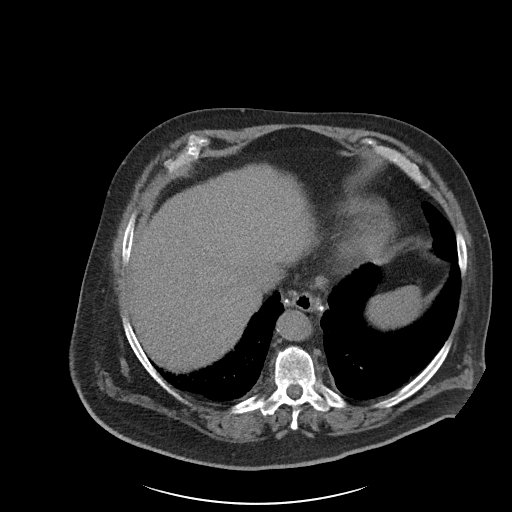
[im 98/102  soft-tissue]
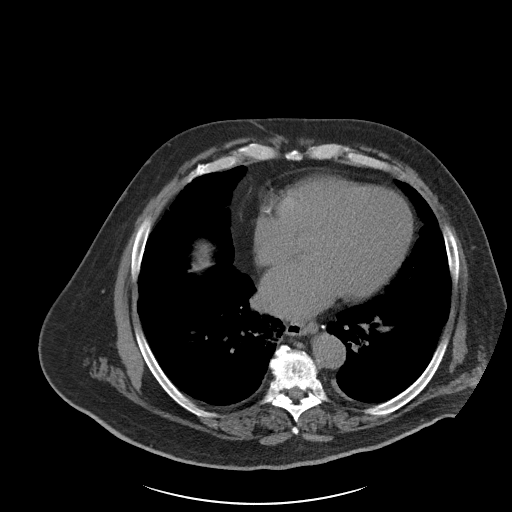

[Series 6: coronal soft tissue · coronal · 1.00mm/px · 3 of 117 slices shown]
[im 39/117  soft-tissue]
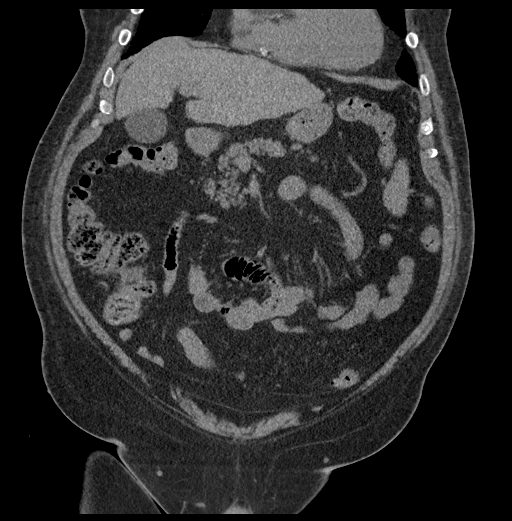
[im 52/117  soft-tissue]
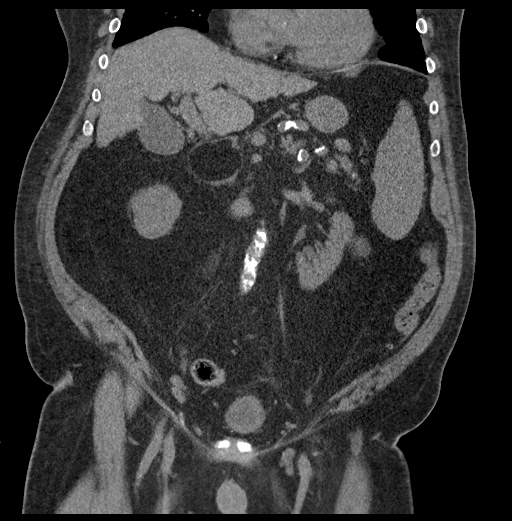
[im 65/117  soft-tissue]
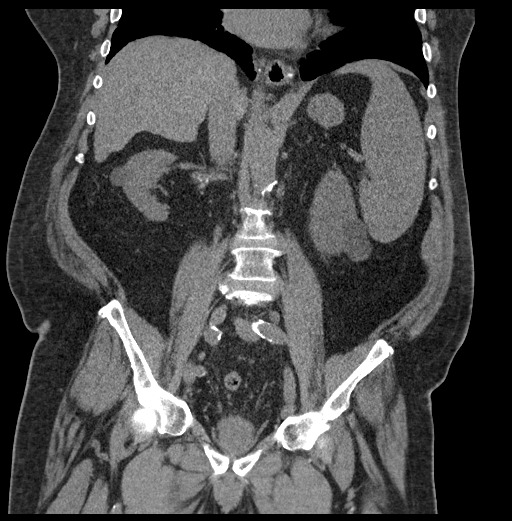

[16 of 46 positions shown; findings below may reference images not displayed]

FINDINGS: Lower chest: Linear and reticular lung base opacities are noted
consistent with fibrosis. Heart borderline enlarged. No acute
findings.

Hepatobiliary: Morphologic changes of cirrhosis with central volume
loss, enlargement of the lateral segment of the left lobe and
caudate lobe and surface nodularity. No discrete liver mass.
Multiple dependent gallstones. No gallbladder wall thickening or
adjacent inflammation. No bile duct dilation.

Pancreas: No mass or inflammation. There is a retroperitoneal lipoma
between the posterior pancreatic head and ventral surface of the
inferior vena cava. Lipoma measures 5.4 cm in greatest dimension.

Spleen: Enlarged measuring 17.9 x 6.2 x 15.1 cm. No splenic mass or
focal lesion.

Adrenals/Urinary Tract: No adrenal masses.

Mild left renal cortical thinning. 4.6 cm exophytic midpole left
renal mass, low in attenuation consistent with a cyst. 2.2 cm
low-attenuation right midpole renal mass also consistent with a
cyst. Three small nonobstructing stones noted in the right kidney.
No left intrarenal stones. No hydronephrosis. Both ureters are
normal in course and in caliber. No ureteral stones. Normal bladder.

Stomach/Bowel: Stomach is unremarkable. Small bowel and colon are
normal in caliber. No wall thickening or inflammation. There are
scattered colonic diverticula without diverticulitis. Normal
appendix visualized.

Vascular/Lymphatic: Prominent external iliac and inguinal lymph
nodes are noted bilaterally largest node a right inguinal node
measuring 14 mm in short axis.

Diffuse aortic atherosclerotic calcifications.  No aneurysm.

Reproductive: Unremarkable.

Other: Small fat containing umbilical hernia. No other hernia. No
ascites.

Musculoskeletal: No fracture or acute finding. No osteoblastic or
osteolytic lesions.
IMPRESSION: 1. No acute findings within the abdomen or pelvis.
2. Cirrhosis with portal venous hypertension, the latter reflected
by splenomegaly. No discrete liver mass.
3. Gallstones.  No acute cholecystitis.
4. Low-density renal masses consistent with cysts. Three
nonobstructing stones in the right kidney.
5. Colonic diverticula without diverticulitis.
6. Aortic atherosclerosis.

## 2021-05-13 ENCOUNTER — Encounter: Payer: Self-pay | Admitting: Gastroenterology

## 2021-06-30 ENCOUNTER — Encounter (HOSPITAL_COMMUNITY): Payer: Self-pay | Admitting: Emergency Medicine

## 2021-06-30 ENCOUNTER — Emergency Department (HOSPITAL_COMMUNITY)
Admission: EM | Admit: 2021-06-30 | Discharge: 2021-06-30 | Disposition: A | Discharge status: Home / Self Care | Payer: No Typology Code available for payment source | Attending: Emergency Medicine | Admitting: Emergency Medicine

## 2021-06-30 ENCOUNTER — Other Ambulatory Visit: Payer: Self-pay

## 2021-06-30 DIAGNOSIS — R21 Rash and other nonspecific skin eruption: Secondary | ICD-10-CM | POA: Insufficient documentation

## 2021-06-30 DIAGNOSIS — L299 Pruritus, unspecified: Secondary | ICD-10-CM | POA: Diagnosis not present

## 2021-06-30 DIAGNOSIS — Z5321 Procedure and treatment not carried out due to patient leaving prior to being seen by health care provider: Secondary | ICD-10-CM | POA: Diagnosis not present

## 2021-06-30 NOTE — ED Triage Notes (Signed)
Pt reports rash to R arm that has disappeared after using cream.

## 2021-06-30 NOTE — ED Provider Notes (Signed)
Emergency Medicine Provider Triage Evaluation Note  Eric Schultz , a 80 y.o. male  was evaluated in triage.  Pt complains of rash to R arm for the past week. Pt states that it is itchy in nature.  He has been placing over-the-counter cortisone cream which seems to relieve his symptoms.  He states that he does not currently have the rash as he applied cortisone cream before coming.  He wants to know what the rashes and seek treatment.  He denies any new medications, new hygiene products, new foods.  Denies any throat swelling, shortness of breath, rash to any other area of his body..  Review of Systems  Positive: + rash  Negative: - SOB, wheezing, throat swellling  Physical Exam  BP 123/66 (BP Location: Right Arm)   Pulse 66   Temp 98.3 F (36.8 C) (Oral)   Resp 18   SpO2 99%  Gen:   Awake, no distress   Resp:  Normal effort  MSK:   Moves extremities without difficulty  Other:  No rash appreciated at this time  Medical Decision Making  Medically screening exam initiated at 10:01 AM.  Appropriate orders placed.  Boby Eyer Saxton was informed that the remainder of the evaluation will be completed by another provider, this initial triage assessment does not replace that evaluation, and the importance of remaining in the ED until their evaluation is complete.  No obvious rash appreciated at this time.  I recommended the patient follow-up with his PCP for further evaluation and to discharge from triage however he wants a second opinion.  He is placed back in the waiting room.   Eustaquio Maize, PA-C 06/30/21 1002    [place], [place], DO 06/30/21 1014

## 2021-07-24 ENCOUNTER — Telehealth: Payer: Self-pay | Admitting: Radiation Oncology

## 2021-07-25 ENCOUNTER — Telehealth: Payer: Self-pay | Admitting: Radiation Oncology

## 2021-07-26 ENCOUNTER — Telehealth: Payer: Self-pay | Admitting: Radiation Oncology

## 2021-08-06 DIAGNOSIS — C20 Malignant neoplasm of rectum: Secondary | ICD-10-CM | POA: Diagnosis not present

## 2021-09-12 ENCOUNTER — Telehealth: Payer: Self-pay | Admitting: Hematology

## 2021-09-12 NOTE — Telephone Encounter (Signed)
Patient's daughter-in-law called requesting to schedule an appointment with Dr. Burr Medico. Sent request to MD due to patient last being seen in 2021.

## 2021-09-28 DIAGNOSIS — Z882 Allergy status to sulfonamides status: Secondary | ICD-10-CM | POA: Diagnosis not present

## 2021-09-28 DIAGNOSIS — I878 Other specified disorders of veins: Secondary | ICD-10-CM | POA: Diagnosis not present

## 2021-09-28 DIAGNOSIS — I452 Bifascicular block: Secondary | ICD-10-CM | POA: Diagnosis not present

## 2021-09-28 DIAGNOSIS — D649 Anemia, unspecified: Secondary | ICD-10-CM | POA: Diagnosis not present

## 2021-09-28 DIAGNOSIS — C7800 Secondary malignant neoplasm of unspecified lung: Secondary | ICD-10-CM | POA: Diagnosis not present

## 2021-09-28 DIAGNOSIS — R600 Localized edema: Secondary | ICD-10-CM | POA: Diagnosis not present

## 2021-09-28 DIAGNOSIS — I10 Essential (primary) hypertension: Secondary | ICD-10-CM | POA: Diagnosis not present

## 2021-09-28 DIAGNOSIS — R9431 Abnormal electrocardiogram [ECG] [EKG]: Secondary | ICD-10-CM | POA: Diagnosis not present

## 2021-09-28 DIAGNOSIS — C20 Malignant neoplasm of rectum: Secondary | ICD-10-CM | POA: Diagnosis not present

## 2021-09-28 DIAGNOSIS — Z66 Do not resuscitate: Secondary | ICD-10-CM | POA: Diagnosis not present

## 2021-09-28 DIAGNOSIS — C19 Malignant neoplasm of rectosigmoid junction: Secondary | ICD-10-CM | POA: Diagnosis not present

## 2021-09-28 DIAGNOSIS — E859 Amyloidosis, unspecified: Secondary | ICD-10-CM | POA: Diagnosis not present

## 2021-09-28 DIAGNOSIS — R739 Hyperglycemia, unspecified: Secondary | ICD-10-CM | POA: Diagnosis not present

## 2021-09-28 DIAGNOSIS — M6289 Other specified disorders of muscle: Secondary | ICD-10-CM | POA: Diagnosis not present

## 2021-09-28 DIAGNOSIS — M25559 Pain in unspecified hip: Secondary | ICD-10-CM | POA: Diagnosis not present

## 2021-09-28 DIAGNOSIS — D61818 Other pancytopenia: Secondary | ICD-10-CM | POA: Diagnosis not present

## 2021-09-28 DIAGNOSIS — M25552 Pain in left hip: Secondary | ICD-10-CM | POA: Diagnosis not present

## 2021-09-28 DIAGNOSIS — I4891 Unspecified atrial fibrillation: Secondary | ICD-10-CM | POA: Diagnosis not present

## 2021-09-28 DIAGNOSIS — I480 Paroxysmal atrial fibrillation: Secondary | ICD-10-CM | POA: Diagnosis not present

## 2021-09-28 DIAGNOSIS — R0602 Shortness of breath: Secondary | ICD-10-CM | POA: Diagnosis not present

## 2021-09-28 DIAGNOSIS — R590 Localized enlarged lymph nodes: Secondary | ICD-10-CM | POA: Diagnosis not present

## 2021-09-28 DIAGNOSIS — I272 Pulmonary hypertension, unspecified: Secondary | ICD-10-CM | POA: Diagnosis not present

## 2021-09-28 DIAGNOSIS — M79652 Pain in left thigh: Secondary | ICD-10-CM | POA: Diagnosis not present

## 2021-09-28 DIAGNOSIS — I252 Old myocardial infarction: Secondary | ICD-10-CM | POA: Diagnosis not present

## 2021-09-28 DIAGNOSIS — K7460 Unspecified cirrhosis of liver: Secondary | ICD-10-CM | POA: Diagnosis not present

## 2021-09-28 DIAGNOSIS — D709 Neutropenia, unspecified: Secondary | ICD-10-CM | POA: Diagnosis not present

## 2021-09-28 DIAGNOSIS — Z20822 Contact with and (suspected) exposure to covid-19: Secondary | ICD-10-CM | POA: Diagnosis not present

## 2021-09-28 DIAGNOSIS — R2243 Localized swelling, mass and lump, lower limb, bilateral: Secondary | ICD-10-CM | POA: Diagnosis not present

## 2021-09-29 DIAGNOSIS — C801 Malignant (primary) neoplasm, unspecified: Secondary | ICD-10-CM | POA: Diagnosis not present

## 2021-09-29 DIAGNOSIS — R609 Edema, unspecified: Secondary | ICD-10-CM | POA: Diagnosis not present

## 2021-09-29 DIAGNOSIS — I350 Nonrheumatic aortic (valve) stenosis: Secondary | ICD-10-CM | POA: Diagnosis not present

## 2021-09-29 DIAGNOSIS — C7800 Secondary malignant neoplasm of unspecified lung: Secondary | ICD-10-CM | POA: Diagnosis not present

## 2021-09-29 DIAGNOSIS — I4891 Unspecified atrial fibrillation: Secondary | ICD-10-CM | POA: Diagnosis not present

## 2021-09-29 DIAGNOSIS — K7460 Unspecified cirrhosis of liver: Secondary | ICD-10-CM | POA: Diagnosis not present

## 2021-09-29 DIAGNOSIS — D649 Anemia, unspecified: Secondary | ICD-10-CM | POA: Diagnosis not present

## 2021-09-29 DIAGNOSIS — M25552 Pain in left hip: Secondary | ICD-10-CM | POA: Diagnosis not present

## 2021-09-29 DIAGNOSIS — I482 Chronic atrial fibrillation, unspecified: Secondary | ICD-10-CM | POA: Diagnosis not present

## 2021-09-29 DIAGNOSIS — D709 Neutropenia, unspecified: Secondary | ICD-10-CM | POA: Diagnosis not present

## 2021-09-29 DIAGNOSIS — C20 Malignant neoplasm of rectum: Secondary | ICD-10-CM | POA: Diagnosis not present

## 2021-09-29 DIAGNOSIS — R918 Other nonspecific abnormal finding of lung field: Secondary | ICD-10-CM | POA: Diagnosis not present

## 2021-10-03 ENCOUNTER — Telehealth: Payer: Self-pay | Admitting: Hematology

## 2021-10-28 ENCOUNTER — Inpatient Hospital Stay: Payer: Medicare PPO | Admitting: Hematology

## 2021-11-17 IMAGING — CR DG CHEST 2V
2 series · 2 of 2 positions shown · non-contrast
Comparison: March 25, 2019

CLINICAL DATA: Chest pain.

EXAM:
CHEST - 2 VIEW

[chest pa]
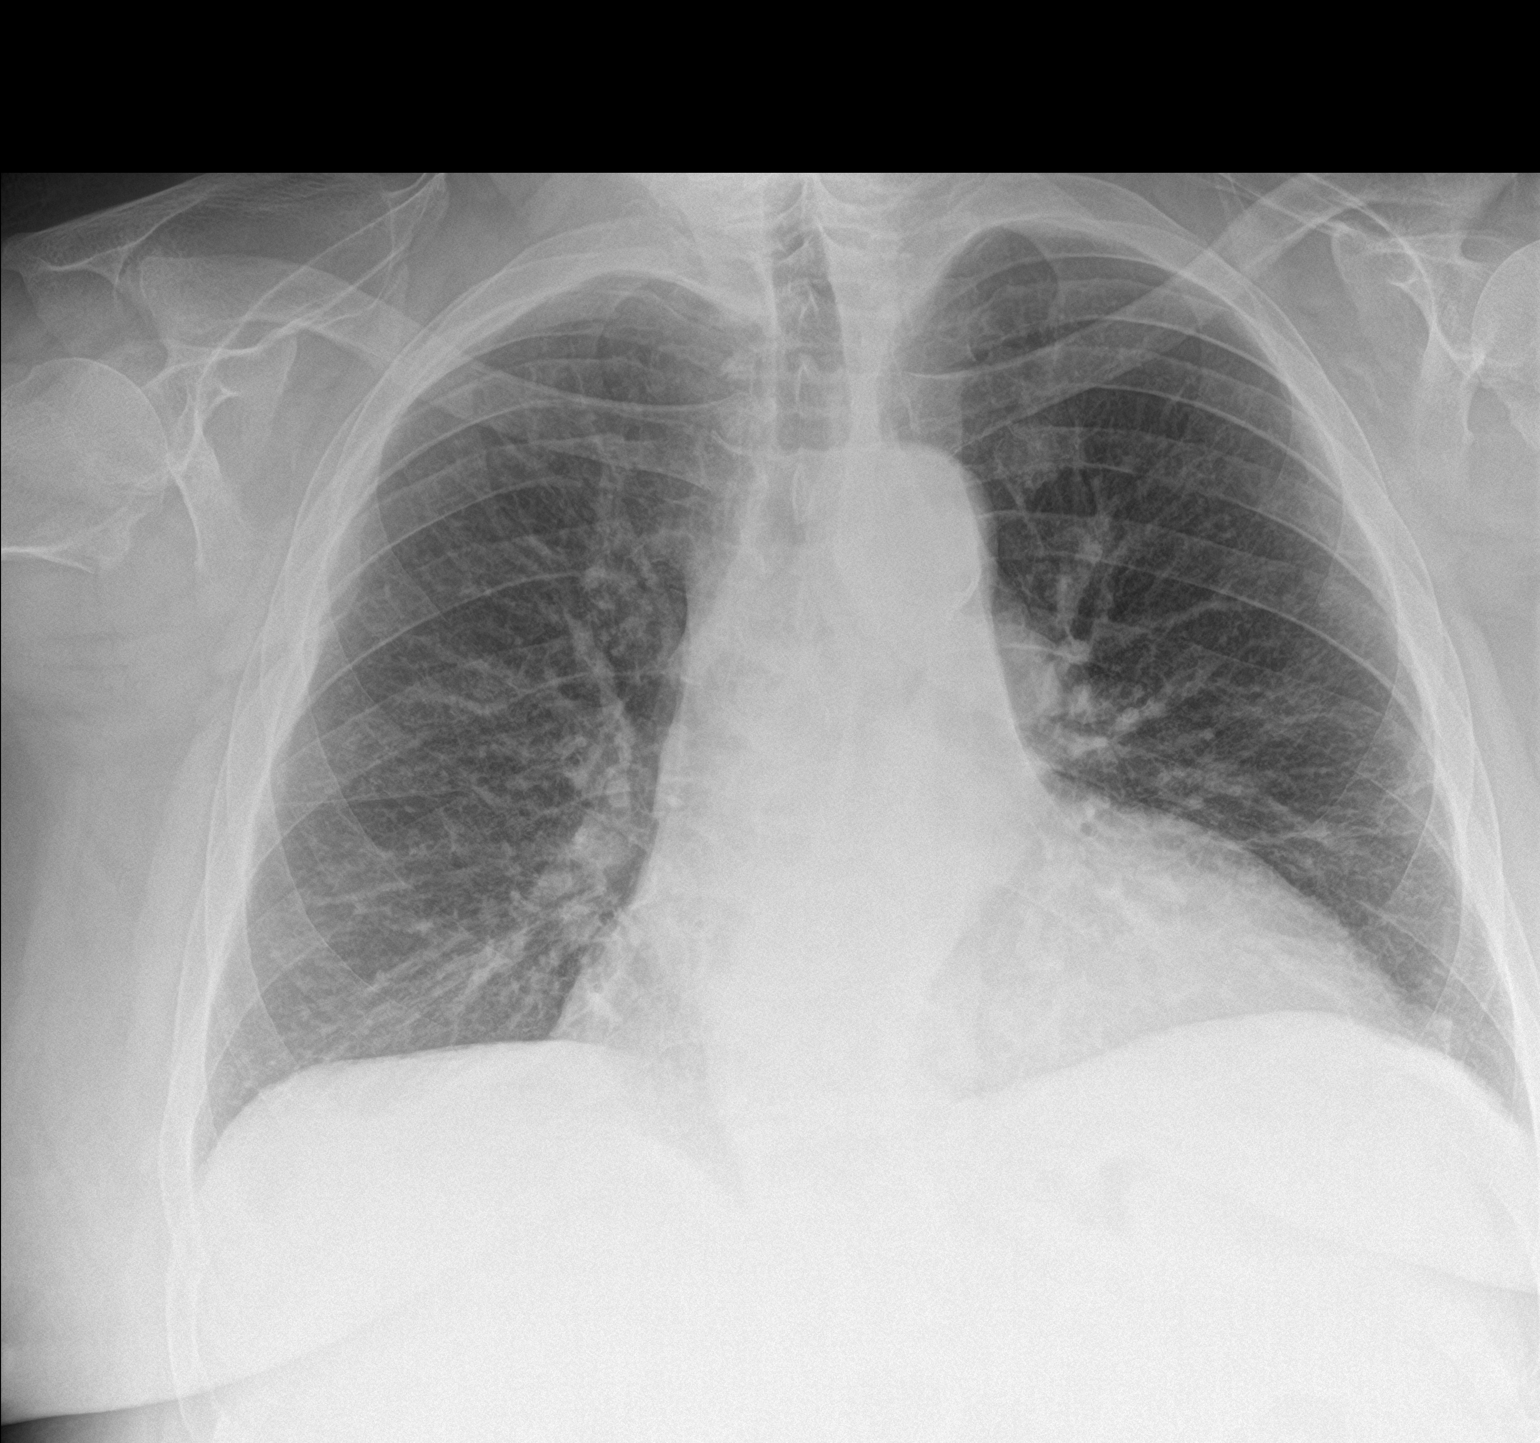

[chest lat]
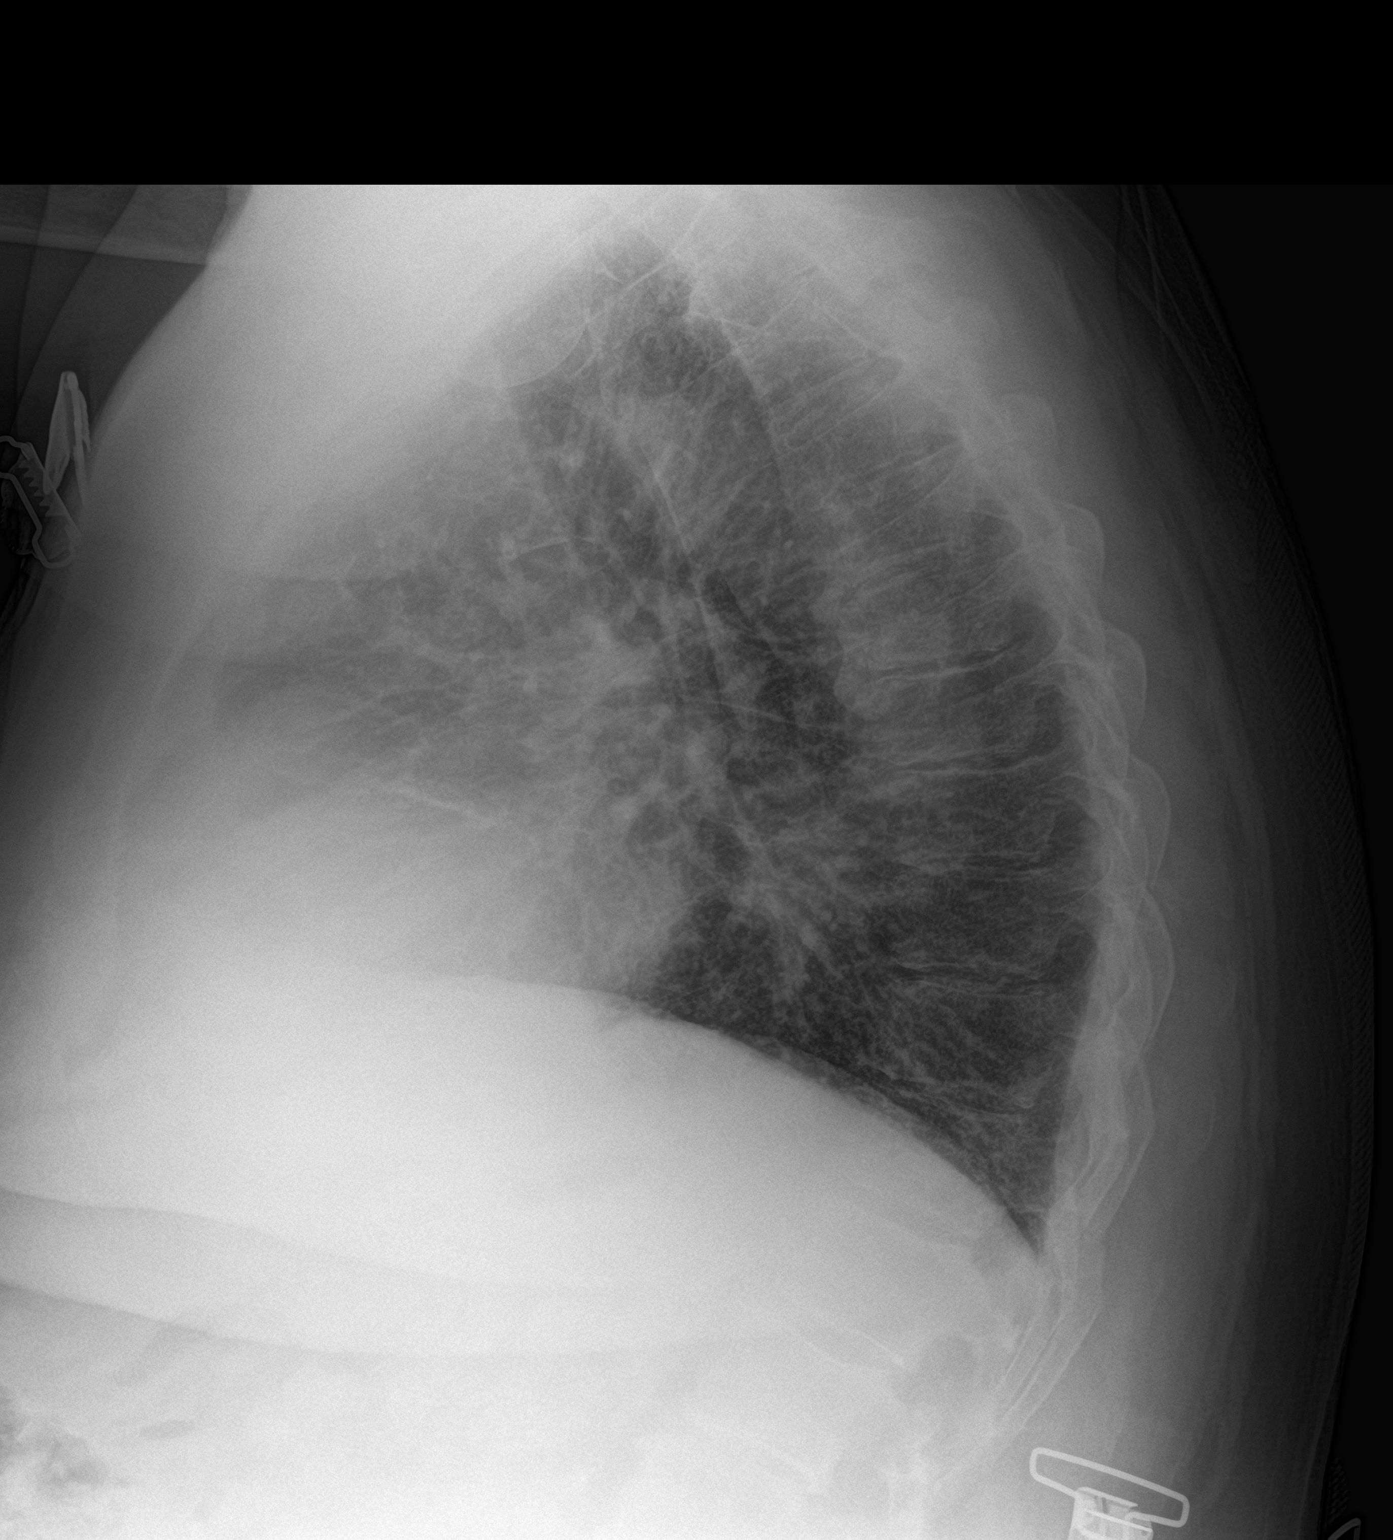

[2 of 2 positions shown; findings below may reference images not displayed]

FINDINGS: The heart size remains enlarged. Aortic calcifications are noted.
There is no pneumothorax. No large pleural effusion. There are few
linear airspace opacities in the left mid lung zone favored to
represent atelectasis or scarring.
IMPRESSION: No active cardiopulmonary disease.

## 2022-06-25 DEATH — deceased

## 2023-09-04 NOTE — Telephone Encounter (Signed)
 Miralax discontinued
